# Patient Record
Sex: Female | Born: 1970 | Race: White | Hispanic: Yes | Marital: Married | Smoking: Never smoker
Health system: Southern US, Community
[De-identification: ages and names within clinical notes are randomized; demographics above are authoritative.]

## PROBLEM LIST (undated history)

## (undated) DIAGNOSIS — R519 Headache, unspecified: Secondary | ICD-10-CM

## (undated) DIAGNOSIS — R87619 Unspecified abnormal cytological findings in specimens from cervix uteri: Secondary | ICD-10-CM

## (undated) DIAGNOSIS — M545 Low back pain, unspecified: Secondary | ICD-10-CM

## (undated) DIAGNOSIS — F909 Attention-deficit hyperactivity disorder, unspecified type: Secondary | ICD-10-CM

## (undated) DIAGNOSIS — IMO0002 Reserved for concepts with insufficient information to code with codable children: Secondary | ICD-10-CM

## (undated) DIAGNOSIS — Z8669 Personal history of other diseases of the nervous system and sense organs: Secondary | ICD-10-CM

## (undated) DIAGNOSIS — F419 Anxiety disorder, unspecified: Secondary | ICD-10-CM

## (undated) DIAGNOSIS — Z8489 Family history of other specified conditions: Secondary | ICD-10-CM

## (undated) HISTORY — DX: Unspecified abnormal cytological findings in specimens from cervix uteri: R87.619

## (undated) HISTORY — PX: TUBAL LIGATION: SHX77

## (undated) HISTORY — DX: Personal history of other diseases of the nervous system and sense organs: Z86.69

## (undated) HISTORY — DX: Reserved for concepts with insufficient information to code with codable children: IMO0002

---
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
## 2007-01-24 HISTORY — PX: DILATION AND CURETTAGE OF UTERUS: SHX78

## 2007-07-11 ENCOUNTER — Ambulatory Visit: Payer: Self-pay | Admitting: Gynecology

## 2007-08-16 ENCOUNTER — Ambulatory Visit: Payer: Self-pay | Admitting: Gynecology

## 2007-08-16 ENCOUNTER — Ambulatory Visit (HOSPITAL_COMMUNITY): Admission: RE | Admit: 2007-08-16 | Discharge: 2007-08-16 | Payer: Self-pay | Admitting: Gynecology

## 2007-10-15 ENCOUNTER — Encounter: Payer: Self-pay | Admitting: Obstetrics & Gynecology

## 2007-10-15 ENCOUNTER — Ambulatory Visit: Payer: Self-pay | Admitting: Obstetrics & Gynecology

## 2007-10-15 DIAGNOSIS — R87612 Low grade squamous intraepithelial lesion on cytologic smear of cervix (LGSIL): Secondary | ICD-10-CM

## 2007-10-15 DIAGNOSIS — IMO0002 Reserved for concepts with insufficient information to code with codable children: Secondary | ICD-10-CM

## 2007-10-15 HISTORY — DX: Reserved for concepts with insufficient information to code with codable children: IMO0002

## 2007-10-15 HISTORY — DX: Low grade squamous intraepithelial lesion on cytologic smear of cervix (LGSIL): R87.612

## 2008-01-15 ENCOUNTER — Ambulatory Visit: Payer: Self-pay | Admitting: Obstetrics and Gynecology

## 2008-01-24 HISTORY — PX: ECTOPIC PREGNANCY SURGERY: SHX613

## 2008-02-12 ENCOUNTER — Inpatient Hospital Stay (HOSPITAL_COMMUNITY): Admission: RE | Admit: 2008-02-12 | Discharge: 2008-02-12 | Payer: Self-pay | Admitting: Family Medicine

## 2008-02-12 ENCOUNTER — Ambulatory Visit: Payer: Self-pay | Admitting: Family Medicine

## 2008-02-14 ENCOUNTER — Inpatient Hospital Stay (HOSPITAL_COMMUNITY): Admission: AD | Admit: 2008-02-14 | Discharge: 2008-02-14 | Payer: Self-pay | Admitting: Family Medicine

## 2008-02-18 ENCOUNTER — Inpatient Hospital Stay (HOSPITAL_COMMUNITY): Admission: AD | Admit: 2008-02-18 | Discharge: 2008-02-18 | Payer: Self-pay | Admitting: Obstetrics & Gynecology

## 2008-02-19 ENCOUNTER — Ambulatory Visit: Payer: Self-pay | Admitting: Obstetrics and Gynecology

## 2008-02-19 ENCOUNTER — Inpatient Hospital Stay (HOSPITAL_COMMUNITY): Admission: AD | Admit: 2008-02-19 | Discharge: 2008-02-19 | Payer: Self-pay | Admitting: Obstetrics and Gynecology

## 2008-02-25 ENCOUNTER — Ambulatory Visit (HOSPITAL_COMMUNITY): Admission: RE | Admit: 2008-02-25 | Discharge: 2008-02-25 | Payer: Self-pay | Admitting: Obstetrics & Gynecology

## 2008-02-25 ENCOUNTER — Ambulatory Visit: Payer: Self-pay | Admitting: Obstetrics and Gynecology

## 2008-02-25 ENCOUNTER — Inpatient Hospital Stay (HOSPITAL_COMMUNITY): Admission: AD | Admit: 2008-02-25 | Discharge: 2008-02-25 | Payer: Self-pay | Admitting: Obstetrics & Gynecology

## 2008-02-28 ENCOUNTER — Encounter: Payer: Self-pay | Admitting: Obstetrics and Gynecology

## 2008-03-03 ENCOUNTER — Ambulatory Visit: Payer: Self-pay | Admitting: Obstetrics and Gynecology

## 2008-07-16 ENCOUNTER — Encounter: Payer: Self-pay | Admitting: Obstetrics & Gynecology

## 2008-07-16 ENCOUNTER — Ambulatory Visit: Payer: Self-pay | Admitting: Obstetrics & Gynecology

## 2008-07-16 LAB — CONVERTED CEMR LAB: Vit D, 25-Hydroxy: 34 ng/mL (ref 30–89)

## 2010-05-09 LAB — CBC
HCT: 36.9 % (ref 36.0–46.0)
Hemoglobin: 12.3 g/dL (ref 12.0–15.0)
MCHC: 33.3 g/dL (ref 30.0–36.0)
MCV: 95.6 fL (ref 78.0–100.0)
Platelets: 332 10*3/uL (ref 150–400)
RDW: 13.7 % (ref 11.5–15.5)

## 2010-05-09 LAB — URINALYSIS, ROUTINE W REFLEX MICROSCOPIC
Ketones, ur: NEGATIVE mg/dL
Leukocytes, UA: NEGATIVE
Nitrite: NEGATIVE
Urobilinogen, UA: 0.2 mg/dL (ref 0.0–1.0)
pH: 5.5 (ref 5.0–8.0)

## 2010-05-09 LAB — DIFFERENTIAL
Lymphocytes Relative: 33 % (ref 12–46)
Monocytes Absolute: 0.7 10*3/uL (ref 0.1–1.0)
Monocytes Relative: 7 % (ref 3–12)
Neutro Abs: 5.8 10*3/uL (ref 1.7–7.7)
Neutrophils Relative %: 59 % (ref 43–77)

## 2010-05-09 LAB — HCG, QUANTITATIVE, PREGNANCY
hCG, Beta Chain, Quant, S: 230 m[IU]/mL — ABNORMAL HIGH (ref <5)
hCG, Beta Chain, Quant, S: 430 m[IU]/mL — ABNORMAL HIGH (ref <5)

## 2010-05-09 LAB — COMPREHENSIVE METABOLIC PANEL
Albumin: 3.6 g/dL (ref 3.5–5.2)
BUN: 5 mg/dL — ABNORMAL LOW (ref 6–23)
Calcium: 9.7 mg/dL (ref 8.4–10.5)
Creatinine, Ser: 0.51 mg/dL (ref 0.4–1.2)
Glucose, Bld: 102 mg/dL — ABNORMAL HIGH (ref 70–99)
Potassium: 4.1 mEq/L (ref 3.5–5.1)
Total Protein: 6.4 g/dL (ref 6.0–8.3)

## 2010-05-09 LAB — URINE MICROSCOPIC-ADD ON

## 2010-05-09 LAB — ABO/RH: ABO/RH(D): O POS

## 2010-05-10 LAB — HCG, QUANTITATIVE, PREGNANCY: hCG, Beta Chain, Quant, S: 60 m[IU]/mL — ABNORMAL HIGH (ref <5)
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
## 2010-06-07 NOTE — Assessment & Plan Note (Signed)
Anne Brown, Anne Brown               ACCOUNT NO.:  [id number]   MEDICAL RECORD NO.:  [id number]          PATIENT TYPE:  POB   LOCATION:  [facility] at [facility]         FACILITY:  [facility]   PHYSICIAN:  Ginger Carne, MD DATE OF BIRTH:  [birth year]   DATE OF SERVICE:  07/11/2007                                  CLINIC NOTE   Ms. Anne Brown presents today for routine gynecologic examination.  Her only  complaint is due to occasional loss of urine when she has episodes of  upper respiratory tract infection.  Otherwise she has no symptoms of  genuine urinary stress incontinence.  She also observes that after  intercourse she tends to have urinary tract infections.  The patient is  also interested in having a tubal ligation.  She has no desire for  further childbearing.  Her menses are regular every 28 days lasting 4-5  days.  She has no GI, GU, or cardiac symptomatology.   OB/GYN HISTORY:  The patient has had 5 pregnancies, 3 full-term  pregnancies and 2 first trimester miscarriages.  Current form of  contraception, Ortho Tri-Cyclen Lo.   ALLERGIES:  None.   CURRENT MEDICATIONS:  1. Wellbutrin 150 mg daily.  2. Ortho Tri-Cyclen Lo.   MEDICAL HISTORY:  None.   SURGICAL HISTORY:  None.   FAMILY HISTORY:  No first-degree relatives with breast, colon, ovarian,  or uterine carcinoma or cardiac or coronary artery heart disease.   SOCIAL HISTORY:  The patient is married, nonsmoker.  Denies alcohol or  illicit drug abuse.   REVIEW OF SYSTEMS:  A 14-point comprehensive review of systems within  normal limits.   PHYSICAL EXAMINATION:  VITAL SIGNS:  Blood pressure 138/88, weight 167  pounds, height 5 feet 4 inches, pulse 81 and regular.  HEENT:  Grossly normal.  BREASTS:  Without masses, discharge, thickenings, or tenderness.  CHEST:  Clear to percussion and auscultation.  CARDIOVASCULAR:  Without murmurs or enlargements.  Regular rate and  rhythm.  ABDOMEN:  Soft without gross  hepatosplenomegaly.  PELVIC:  External genitalia, vulva, and vagina are normal.  Cervix is  smooth without erosions or lesions.  Pap smear was not performed today  due to the patient starting her menses.  Uterus is small, anteverted,  and flexed.  Both adnexa palpable and found to be normal.  EXTREMITY:  Within normal limits.  LYMPHATIC:  Within normal limits.  SKIN:  Within normal limits.  NEUROLOGICAL:  Within normal limits.  MUSCULOSKELETAL:  Within normal limits.   IMPRESSION:  1. Normal gynecologic exam.  2. Post intercourse urinary tract infections.  3. Request for permanent sterilization.   PLAN:  I advised the patient that occasional loss of urine only at times  of coughing or sneezing related to an upper respiratory tract infection  would not benefit entirely from a TVT procedure.  I prescribed Macrobid  100 mg after intercourse for prophylaxis, and I have scheduled her for a  bilateral laparoscopic tubal cauterization.  Ashby Dawes of said procedure  was discussed in detail.  The failure rate of one per thousand was  discussed and understood by said patient.  She will be scheduled in the  near future  for this operation.  Otherwise, the patient will have a Pap  smear when she returns for her postoperative visit.           ______________________________  Ginger Carne, MD     SHB/MEDQ  D:  07/11/2007  T:  07/12/2007  Job:  [id number]

## 2010-06-07 NOTE — Op Note (Signed)
NAMESAMEERA, BETTON               ACCOUNT NO.:  [id number]   MEDICAL RECORD NO.:  [id number]          PATIENT TYPE:  AMB   LOCATION:  [facility]                           FACILITY:  [facility]   PHYSICIAN:  Ginger Carne, MD  DATE OF BIRTH:  June 26, 1970   DATE OF PROCEDURE:  08/16/2007  DATE OF DISCHARGE:                               OPERATIVE REPORT   PREOPERATIVE DIAGNOSIS:  Sterilization.   POSTOPERATIVE DIAGNOSIS:  Sterilization.   PROCEDURE:  Bilateral laparoscopic tubal cauterization.   SURGEON:  Ginger Carne, MD   ASSISTANT:  None.   COMPLICATIONS:  None immediate.   ESTIMATED BLOOD LOSS:  Minimal.   FINDINGS:  The patient demonstrates stage I endometriosis with flecks  noted on the broad ligaments and uterosacral ligaments bilaterally,  otherwise uterus, tubes, and ovaries were unremarkable.  Both tubes were  identified from their isthmus to fimbriated ends, separate apart from  their respective round ligaments.   OPERATIVE PROCEDURE:  The patient prepped and draped in usual fashion  and placed in the lithotomy position.  Betadine solution used for  antiseptic and the patient was catheterized prior to procedure.  After  adequate general anesthesia, tenaculum placed in the anterior lip of the  cervix.  Vertical infraumbilical incision was made, and a Veress needle  placed in the abdomen.  Opening closing pressures were 10-15 mmHg.  Needle released, trocar placed in the same incision.  Laparoscope placed  in the trocar sleeve.  A second probe site was made in the left lower  quadrant under direct visualization.  Afterwards, both tubes were  cauterized from their isthmus to mid ampullary region incorporating  about 3 cm.  Tubes were cut in the midportion of cauterization.  No  active bleeding noted.  Gas released, trocars removed.  Closure of 10-mm  fascia site with 0 Vicryl suture and 4-0 Vicryl for subcuticular  closure.  Instruments and sponge count were correct.  The  patient  tolerated the procedure well and returned to post anesthesia recovery  room in excellent condition.      Ginger Carne, MD  Electronically Signed     SHB/MEDQ  D:  08/16/2007  T:  08/16/2007  Job:  [id number]

## 2010-06-07 NOTE — Assessment & Plan Note (Signed)
NAMESANOE, HAZAN               ACCOUNT NO.:  [id number]   MEDICAL RECORD NO.:  [id number]          PATIENT TYPE:  POB   LOCATION:  [facility] at [facility]         FACILITY:  [facility]   PHYSICIAN:  Elsie Lincoln, MD      DATE OF BIRTH:  [birth year]   DATE OF SERVICE:  07/16/2008                                  CLINIC NOTE   The patient is a 40 year old female who presents for several reasons,  first the patient has a history of LSIL Pap smear and had a negative  biopsy.  Her colposcopy seemed adequate by Dr. Okey Dupre in December.  The  patient is here for followup Pap smear.  The patient also had a history  of an ectopic and needs a beta-hCG level drawn, her last level was 18.  Finally, the patient was having some lethargy, back pain, and she is  overall not feeling well and would like her vitamin D level checked, and  we will check that today.  We will call the patient with the vitamin D  level and then have the patient come back in 6 months for another Pap  smear.           ______________________________  Elsie Lincoln, MD     KL/MEDQ  D:  07/16/2008  T:  07/17/2008  Job:  [id number]

## 2010-08-16 ENCOUNTER — Ambulatory Visit: Payer: Self-pay | Admitting: Obstetrics & Gynecology

## 2010-08-18 ENCOUNTER — Ambulatory Visit: Payer: Self-pay | Admitting: Obstetrics and Gynecology

## 2010-08-25 ENCOUNTER — Encounter: Payer: Self-pay | Admitting: Obstetrics & Gynecology

## 2010-08-25 ENCOUNTER — Ambulatory Visit (INDEPENDENT_AMBULATORY_CARE_PROVIDER_SITE_OTHER): Payer: Self-pay | Admitting: Obstetrics & Gynecology

## 2010-08-25 DIAGNOSIS — Z1272 Encounter for screening for malignant neoplasm of vagina: Secondary | ICD-10-CM

## 2010-08-25 DIAGNOSIS — Z124 Encounter for screening for malignant neoplasm of cervix: Secondary | ICD-10-CM

## 2010-08-25 DIAGNOSIS — Z01419 Encounter for gynecological examination (general) (routine) without abnormal findings: Secondary | ICD-10-CM

## 2010-08-25 NOTE — Progress Notes (Signed)
  Subjective:     Anne Brown is a 40 y.o. female here for a routine exam.  Current complaints: No GYN complaints.    Gynecologic History Patient's last menstrual period was 08/15/2010. Contraception: condoms and tubal ligation Last Pap: 06/2008. Results were: normal  Obstetric History OB History    Grav Para Term Preterm Abortions TAB SAB Ect Mult Living   6 3 3  0 3 0 2 1 0 3     # Outc Date GA Lbr Len/2nd Wgt Sex Del Anes PTL Lv   1 TRM     F SVD   Yes   2 TRM     M SVD   Yes   3 TRM     M    Yes   4 SAB            5 SAB            6 ECT                The following portions of the patient's history were reviewed and updated as appropriate: allergies, current medications, past family history, past medical history, past social history, past surgical history and problem list.  Review of Systems A comprehensive review of systems was negative.    Objective:    GENERAL: Well-developed, well-nourished female in no acute distress.  HEENT: Normocephalic, atraumatic. Sclerae anicteric.  NECK: Supple. Normal thyroid.  LUNGS: Clear to auscultation bilaterally.  HEART: Regular rate and rhythm. BREASTS: Symmetric with everted nipples. No masses, skin changes, nipple drainage,   lymphadenopathy. ABDOMEN: Soft, nontender, nondistended. No organomegaly. PELVIC: Normal external female genitalia. Vagina is pink and rugated.  Normal discharge. Normal cervix contour. Uterus is normal in size. No adnexal mass or tenderness. Pap smear obtained.  EXTREMITIES: No cyanosis, clubbing, or edema, 2+ distal pulses.     Assessment:    Healthy female exam.    Plan:    Education reviewed: safe sex/STD prevention. Contraception: condoms and tubal ligation. Follow up in: 1 year. Patient offered HSG given history of ectopic s/p BTL, she declined for now. Will continue to use condoms. Christan Ciccarelli A 08/25/2010

## 2010-08-30 ENCOUNTER — Telehealth: Payer: Self-pay

## 2010-08-30 DIAGNOSIS — N390 Urinary tract infection, site not specified: Secondary | ICD-10-CM

## 2010-08-30 MED ORDER — NITROFURANTOIN MONOHYD MACRO 100 MG PO CAPS: 100.0000 mg | ORAL_CAPSULE | Freq: Two times a day (BID) | ORAL | Status: DC
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
## 2010-10-21 LAB — CBC
HCT: 39.5
MCV: 92.6
Platelets: 346
RBC: 4.26
WBC: 7.3

## 2010-10-21 LAB — BASIC METABOLIC PANEL
BUN: 6
Chloride: 105
Creatinine, Ser: 0.57
GFR calc Af Amer: >60
GFR calc non Af Amer: >60
Potassium: 3.6

## 2010-10-21 LAB — HCG, SERUM, QUALITATIVE: Preg, Serum: NEGATIVE

## 2011-07-06 ENCOUNTER — Ambulatory Visit: Payer: Self-pay | Admitting: Obstetrics & Gynecology

## 2011-07-09 ENCOUNTER — Other Ambulatory Visit: Payer: Self-pay | Admitting: Obstetrics & Gynecology

## 2011-11-09 ENCOUNTER — Ambulatory Visit: Payer: Self-pay | Admitting: Obstetrics & Gynecology

## 2011-11-23 ENCOUNTER — Ambulatory Visit: Payer: Self-pay | Admitting: Obstetrics & Gynecology

## 2012-05-09 ENCOUNTER — Other Ambulatory Visit: Payer: Self-pay | Admitting: Obstetrics & Gynecology

## 2012-05-09 ENCOUNTER — Ambulatory Visit (INDEPENDENT_AMBULATORY_CARE_PROVIDER_SITE_OTHER): Payer: BC Managed Care – PPO | Admitting: Obstetrics & Gynecology

## 2012-05-09 ENCOUNTER — Encounter: Payer: Self-pay | Admitting: Obstetrics & Gynecology

## 2012-05-09 VITALS — BP 126/84 | HR 80 | Ht 61.0 in | Wt 169.8 lb

## 2012-05-09 DIAGNOSIS — Z124 Encounter for screening for malignant neoplasm of cervix: Secondary | ICD-10-CM

## 2012-05-09 DIAGNOSIS — R300 Dysuria: Secondary | ICD-10-CM

## 2012-05-09 DIAGNOSIS — Z1231 Encounter for screening mammogram for malignant neoplasm of breast: Secondary | ICD-10-CM

## 2012-05-09 DIAGNOSIS — Z01419 Encounter for gynecological examination (general) (routine) without abnormal findings: Secondary | ICD-10-CM

## 2012-05-09 DIAGNOSIS — Z1151 Encounter for screening for human papillomavirus (HPV): Secondary | ICD-10-CM

## 2012-05-09 DIAGNOSIS — Z0000 Encounter for general adult medical examination without abnormal findings: Secondary | ICD-10-CM

## 2012-05-09 LAB — POCT URINALYSIS DIPSTICK
Bilirubin, UA: NEGATIVE
Blood, UA: NEGATIVE
Nitrite, UA: NEGATIVE
Urobilinogen, UA: 0.2
pH, UA: 5

## 2012-05-09 LAB — CBC
Hemoglobin: 13.5 g/dL (ref 12.0–15.0)
MCH: 31.5 pg (ref 26.0–34.0)
MCHC: 34.4 g/dL (ref 30.0–36.0)

## 2012-05-09 MED ORDER — NORGESTREL-ETHINYL ESTRADIOL 0.3-30 MG-MCG PO TABS: 1.0000 | ORAL_TABLET | Freq: Every day | ORAL | Status: DC

## 2012-05-09 NOTE — Progress Notes (Signed)
Here today for yearly gyn physical and pap smear. Has issues with urine with strong odor, never seems to turn into a UTI.  Having very heavy periods that require a tampon and a pad. Wants fasting labs today to include liver function.

## 2012-05-09 NOTE — Progress Notes (Signed)
Subjective:    Anne Brown is a 42 y.o. female who presents for an annual exam. She has 2 problems today. 1) 8 month h/o increasing dysmenorrhea and menorrhagia. She reports that menses last 3 days but on one day it is so heavy that she has to wear a pad and tampon at the same time. She takes Midol or IBU 800 mg for the pain with some relief. Her second complaint is that of 5 year h/o post coital occasional bladder infections (takes macrobid post sex). Now she complains of dysuria without having had sex. No urine cultures have been done here yet.  The patient is sexually active. GYN screening history: last pap: was abnormal: LGSIL. The patient wears seatbelts: yes. The patient participates in regular exercise: no. Has the patient ever been transfused or tattooed?: no. The patient reports that there is not domestic violence in her life.   Menstrual History: OB History   Grav Para Term Preterm Abortions TAB SAB Ect Mult Living   6 3 3  0 3 0 2 1 0 3      Menarche age: 79  Patient's last menstrual period was 05/02/2012.    The following portions of the patient's history were reviewed and updated as appropriate: allergies, current medications, past family history, past medical history, past social history, past surgical history and problem list.  Review of Systems A comprehensive review of systems was negative. 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 She has been married for 5 years. Kids are 53, 23, 78 yo children.   Objective:    BP 126/84  Pulse 80  Ht 5\' 1"  (1.549 m)  Wt 169 lb 12.8 oz (77.021 kg)  BMI 32.1 kg/m2  LMP 05/02/2012  General Appearance:    Alert, cooperative, no distress, appears stated age  Head:    Normocephalic, without obvious abnormality, atraumatic  Eyes:    PERRL, conjunctiva/corneas clear, EOM's intact, fundi    benign, both eyes  Ears:    Normal TM's and external ear canals, both ears  Nose:   Nares normal, septum midline, mucosa normal, no drainage     or sinus tenderness  Throat:   Lips, mucosa, and tongue normal; teeth and gums normal  Neck:   Supple, symmetrical, trachea midline, no adenopathy;    thyroid:  no enlargement/tenderness/nodules; no carotid   bruit or JVD  Back:     Symmetric, no curvature, ROM normal, no CVA tenderness  Lungs:     Clear to auscultation bilaterally, respirations unlabored  Chest Wall:    No tenderness or deformity   Heart:    Regular rate and rhythm, S1 and S2 normal, no murmur, rub   or gallop  Breast Exam:    No tenderness, masses, or nipple abnormality  Abdomen:     Soft, non-tender, bowel sounds active all four quadrants,    no masses, no organomegaly  Genitalia:    Normal female without lesion, discharge or tenderness, NSSA, NT, mobile, normal adnexal exam     Extremities:   Extremities normal, atraumatic, no cyanosis or edema  Pulses:   2+ and symmetric all extremities  Skin:   Skin color, texture, turgor normal, no rashes or lesions  Lymph nodes:   Cervical, supraclavicular, and axillary nodes normal  Neurologic:   CNII-XII intact, normal strength, sensation and reflexes    throughout  .    Assessment:    Healthy female exam.  Menorrhagia   Plan:     Mammogram. Urine culture  and sensitivity. screening labs  Pap with HPV cotesting Start OCPs for menorrhagia. U/S if this doesn't help.

## 2012-05-10 LAB — LIPID PANEL
Cholesterol: 133 mg/dL (ref 0–200)
HDL: 71 mg/dL (ref >39)
Total CHOL/HDL Ratio: 1.9 Ratio
Triglycerides: 50 mg/dL (ref <150)
VLDL: 10 mg/dL (ref 0–40)

## 2012-05-10 LAB — COMPREHENSIVE METABOLIC PANEL
Alkaline Phosphatase: 76 U/L (ref 39–117)
Creat: 0.64 mg/dL (ref 0.50–1.10)
Glucose, Bld: 96 mg/dL (ref 70–99)
Sodium: 140 mEq/L (ref 135–145)
Total Bilirubin: 0.4 mg/dL (ref 0.3–1.2)
Total Protein: 7.1 g/dL (ref 6.0–8.3)

## 2012-05-13 ENCOUNTER — Telehealth: Payer: Self-pay | Admitting: *Deleted

## 2012-05-13 MED ORDER — SULFAMETHOXAZOLE-TRIMETHOPRIM 800-160 MG PO TABS: 1.0000 | ORAL_TABLET | Freq: Two times a day (BID) | ORAL | Status: DC

## 2012-05-13 NOTE — Telephone Encounter (Signed)
Per Dr. Marice Potter called in Bactrim DS for urinary tract infection.

## 2012-05-23 ENCOUNTER — Ambulatory Visit (HOSPITAL_COMMUNITY): Payer: BC Managed Care – PPO

## 2012-09-02 ENCOUNTER — Other Ambulatory Visit: Payer: Self-pay | Admitting: Obstetrics & Gynecology

## 2012-09-02 DIAGNOSIS — Z1231 Encounter for screening mammogram for malignant neoplasm of breast: Secondary | ICD-10-CM

## 2012-09-05 ENCOUNTER — Ambulatory Visit (HOSPITAL_COMMUNITY): Payer: Self-pay

## 2012-10-03 ENCOUNTER — Ambulatory Visit (HOSPITAL_COMMUNITY): Payer: Self-pay

## 2013-05-15 ENCOUNTER — Telehealth: Payer: Self-pay

## 2013-05-15 NOTE — Telephone Encounter (Signed)
PATIENT CALLED HAD A YEAST INFECTION, CALLED IN DYFLUCAN 150 #2 WITH 1 REFILL. SHE IS ON AN ANTIBIOTIC AND HAS DEVELOPED A YEAST INFECTION FROM IT. CALLED IT IN TO HER [facility].

## 2013-11-24 ENCOUNTER — Encounter: Payer: Self-pay | Admitting: Obstetrics & Gynecology

## 2014-02-27 ENCOUNTER — Other Ambulatory Visit (HOSPITAL_COMMUNITY)
Admission: RE | Admit: 2014-02-27 | Discharge: 2014-02-27 | Disposition: A | Discharge status: Home / Self Care | Payer: 59 | Source: Ambulatory Visit | Attending: Gynecology | Admitting: Gynecology

## 2014-02-27 ENCOUNTER — Ambulatory Visit (INDEPENDENT_AMBULATORY_CARE_PROVIDER_SITE_OTHER): Payer: 59 | Admitting: Gynecology

## 2014-02-27 ENCOUNTER — Encounter: Payer: Self-pay | Admitting: Gynecology

## 2014-02-27 VITALS — BP 122/78 | Ht 61.5 in | Wt 161.0 lb

## 2014-02-27 DIAGNOSIS — Z1151 Encounter for screening for human papillomavirus (HPV): Secondary | ICD-10-CM | POA: Insufficient documentation

## 2014-02-27 DIAGNOSIS — Z01419 Encounter for gynecological examination (general) (routine) without abnormal findings: Secondary | ICD-10-CM | POA: Insufficient documentation

## 2014-02-27 DIAGNOSIS — Z23 Encounter for immunization: Secondary | ICD-10-CM

## 2014-02-27 DIAGNOSIS — N920 Excessive and frequent menstruation with regular cycle: Secondary | ICD-10-CM

## 2014-02-27 LAB — COMPREHENSIVE METABOLIC PANEL
ALBUMIN: 4.3 g/dL (ref 3.5–5.2)
ALT: 18 U/L (ref 0–35)
AST: 18 U/L (ref 0–37)
Alkaline Phosphatase: 75 U/L (ref 39–117)
BUN: 5 mg/dL — AB (ref 6–23)
CALCIUM: 9.2 mg/dL (ref 8.4–10.5)
CHLORIDE: 103 meq/L (ref 96–112)
CO2: 26 meq/L (ref 19–32)
Creat: 0.6 mg/dL (ref 0.50–1.10)
Glucose, Bld: 86 mg/dL (ref 70–99)
Potassium: 3.9 mEq/L (ref 3.5–5.3)
SODIUM: 139 meq/L (ref 135–145)
Total Bilirubin: 0.3 mg/dL (ref 0.2–1.2)
Total Protein: 6.9 g/dL (ref 6.0–8.3)

## 2014-02-27 LAB — CBC WITH DIFFERENTIAL/PLATELET
BASOS ABS: 0 10*3/uL (ref 0.0–0.1)
Basophils Relative: 0 % (ref 0–1)
Eosinophils Absolute: 0.1 10*3/uL (ref 0.0–0.7)
Eosinophils Relative: 1 % (ref 0–5)
HCT: 40 % (ref 36.0–46.0)
Hemoglobin: 13.3 g/dL (ref 12.0–15.0)
Lymphocytes Relative: 35 % (ref 12–46)
Lymphs Abs: 2.5 10*3/uL (ref 0.7–4.0)
MCH: 30.8 pg (ref 26.0–34.0)
MCHC: 33.3 g/dL (ref 30.0–36.0)
MCV: 92.6 fL (ref 78.0–100.0)
MONO ABS: 0.4 10*3/uL (ref 0.1–1.0)
MPV: 9.6 fL (ref 8.6–12.4)
Monocytes Relative: 6 % (ref 3–12)
NEUTROS ABS: 4.2 10*3/uL (ref 1.7–7.7)
NEUTROS PCT: 58 % (ref 43–77)
Platelets: 320 10*3/uL (ref 150–400)
RBC: 4.32 MIL/uL (ref 3.87–5.11)
RDW: 13.2 % (ref 11.5–15.5)
WBC: 7.2 10*3/uL (ref 4.0–10.5)

## 2014-02-27 LAB — CHOLESTEROL, TOTAL: CHOLESTEROL: 131 mg/dL (ref 0–200)

## 2014-02-27 MED ORDER — DOXYCYCLINE HYCLATE 100 MG PO CAPS
100.0000 mg | ORAL_CAPSULE | Freq: Two times a day (BID) | ORAL | Status: DC
Start: 2014-02-27 — End: 2014-04-24

## 2014-02-27 NOTE — Patient Instructions (Addendum)
Hysterosalpingography Hysterosalpingography is a procedure to look inside your uterus and fallopian tubes. During this procedure, contrast dye is injected into your uterus through your vagina and cervix to illuminate your uterus while X-ray pictures are taken. This procedure may help your health care provider determine whether you have uterine tumors, adhesions, or structural abnormalities. It is commonly used to help determine why a woman is unable to have children (infertility). The procedure usually lasts about 15-30 minutes. LET [facility] CARE PROVIDER KNOW ABOUT:  Any allergies you have.  All medicines you are taking, including vitamins, herbs, eye drops, creams, and over-the-counter medicines.  Previous problems you or members of your family have had with the use of anesthetics.  Any blood disorders you have.  Previous surgeries you have had.  Medical conditions you have. RISKS AND COMPLICATIONS  Generally, this is a safe procedure. However, as with any procedure, problems can occur. Possible problems include:  Infection in the lining of the uterus (endometritis) or fallopian tubes (salpingitis).  Damage or perforation of the uterus or fallopian tubes.  An allergic reaction to the contrast dye used to perform the X-ray. BEFORE THE PROCEDURE   Schedule the procedure after your period stops, but before your next ovulation. This is usually between day 5 and day 10 of your last period. Day 1 is the first day of your period.  Ask your health care provider about changing or stopping your regular medicines.  You may eat and drink as normal.  Empty your bladder before the procedure begins. PROCEDURE  You may be given a medicine to relax you (sedative) or an over-the-counter pain medicine to lessen any discomfort during the procedure.  You will lie down on an X-ray table with your feet in stirrups.  A device called a speculum will be placed into your vagina. This allows your  health care provider to see inside your vagina to the cervix.  The cervix will be washed with a special soap.  A thin, flexible tube will be passed through the cervix into the uterus.  Contrast dye will be put into this tube.  Several X-rays will be taken as the contrast dye spreads through the uterus and fallopian tubes.  The tube will be taken out after the procedure. AFTER THE PROCEDURE   Most of the contrast dye will flow out of your vagina naturally. You may want to wear a sanitary pad.  You may feel mild cramping and notice a little bleeding from your vagina. This should go away in 24 hours.  Ask when your test results will be ready. Make sure you get your test results. Document Released: 02/12/2004 Document Revised: 01/14/2013 Document Reviewed: 07/12/2012 [facility] Patient Information 2015 [place], [place]. This information is not intended to replace advice given to you by your health care provider. Make sure you discuss any questions you have with your health care provider. Influenza Virus Vaccine (Flucelvax) What is this medicine? INFLUENZA VIRUS VACCINE (in floo EN zuh VAHY ruhs vak SEEN) helps to reduce the risk of getting influenza also known as the flu. The vaccine only helps protect you against some strains of the flu. This medicine may be used for other purposes; ask your health care provider or pharmacist if you have questions. COMMON BRAND NAME(S): FLUCELVAX What should I tell my health care provider before I take this medicine? They need to know if you have any of these conditions: -bleeding disorder like hemophilia -fever or infection -Guillain-Barre syndrome or other neurological problems -immune system  problems -infection with the human immunodeficiency virus (HIV) or AIDS -low blood platelet counts -multiple sclerosis -an unusual or allergic reaction to influenza virus vaccine, other medicines, foods, dyes or preservatives -pregnant or trying to get  pregnant -breast-feeding How should I use this medicine? This vaccine is for injection into a muscle. It is given by a health care professional. A copy of Vaccine Information Statements will be given before each vaccination. Read this sheet carefully each time. The sheet may change frequently. Talk to your pediatrician regarding the use of this medicine in children. Special care may be needed. Overdosage: If you think you've taken too much of this medicine contact a poison control center or emergency room at once. Overdosage: If you think you have taken too much of this medicine contact a poison control center or emergency room at once. NOTE: This medicine is only for you. Do not share this medicine with others. What if I miss a dose? This does not apply. What may interact with this medicine? -chemotherapy or radiation therapy -medicines that lower your immune system like etanercept, anakinra, infliximab, and adalimumab -medicines that treat or prevent blood clots like warfarin -phenytoin -steroid medicines like prednisone or cortisone -theophylline -vaccines This list may not describe all possible interactions. Give your health care provider a list of all the medicines, herbs, non-prescription drugs, or dietary supplements you use. Also tell them if you smoke, drink alcohol, or use illegal drugs. Some items may interact with your medicine. What should I watch for while using this medicine? Report any side effects that do not go away within 3 days to your doctor or health care professional. Call your health care provider if any unusual symptoms occur within 6 weeks of receiving this vaccine. You may still catch the flu, but the illness is not usually as bad. You cannot get the flu from the vaccine. The vaccine will not protect against colds or other illnesses that may cause fever. The vaccine is needed every year. What side effects may I notice from receiving this medicine? Side effects that  you should report to your doctor or health care professional as soon as possible: -allergic reactions like skin rash, itching or hives, swelling of the face, lips, or tongue Side effects that usually do not require medical attention (Report these to your doctor or health care professional if they continue or are bothersome.): -fever -headache -muscle aches and pains -pain, tenderness, redness, or swelling at the injection site -tiredness This list may not describe all possible side effects. Call your doctor for medical advice about side effects. You may report side effects to FDA at 1-800-FDA-1088. Where should I keep my medicine? The vaccine will be given by a health care professional in a clinic, pharmacy, doctor's office, or other health care setting. You will not be given vaccine doses to store at home. NOTE: This sheet is a summary. It may not cover all possible information. If you have questions about this medicine, talk to your doctor, pharmacist, or health care provider.  2015, Elsevier/Gold Standard. (2010-12-21 14:06:47) Endometrial Ablation Endometrial ablation removes the lining of the uterus (endometrium). It is usually a same-day, outpatient treatment. Ablation helps avoid major surgery, such as surgery to remove the cervix and uterus (hysterectomy). After endometrial ablation, you will have little or no menstrual bleeding and may not be able to have children. However, if you are premenopausal, you will need to use a reliable method of birth control following the procedure because of the small chance  that pregnancy can occur. There are different reasons to have this procedure, which include: Heavy periods. Bleeding that is causing anemia. Irregular bleeding. Bleeding fibroids on the lining inside the uterus if they are smaller than 3 centimeters. This procedure should not be done if: You want children in the future. You have severe cramps with your menstrual period. You have  precancerous or cancerous cells in your uterus. You were recently pregnant. You have gone through menopause. You have had major surgery on the uterus, such as a cesarean delivery. LET [facility] CARE PROVIDER KNOW ABOUT: Any allergies you have. All medicines you are taking, including vitamins, herbs, eye drops, creams, and over-the-counter medicines. Previous problems you or members of your family have had with the use of anesthetics. Any blood disorders you have. Previous surgeries you have had. Medical conditions you have. RISKS AND COMPLICATIONS  Generally, this is a safe procedure. However, as with any procedure, complications can occur. Possible complications include: Perforation of the uterus. Bleeding. Infection of the uterus, bladder, or vagina. Injury to surrounding organs. An air bubble to the lung (air embolus). Pregnancy following the procedure. Failure of the procedure to help the problem, requiring hysterectomy. Decreased ability to diagnose cancer in the lining of the uterus. BEFORE THE PROCEDURE The lining of the uterus must be tested to make sure there is no pre-cancerous or cancer cells present. An ultrasound may be performed to look at the size of the uterus and to check for abnormalities. Medicines may be given to thin the lining of the uterus. PROCEDURE  During the procedure, your health care provider will use a tool called a resectoscope to help see inside your uterus. There are different ways to remove the lining of your uterus.  Radiofrequency - This method uses a radiofrequency-alternating electric current to remove the lining of the uterus. Cryotherapy - This method uses extreme cold to freeze the lining of the uterus. Heated-Free Liquid - This method uses heated salt (saline) solution to remove the lining of the uterus. Microwave - This method uses high-energy microwaves to heat up the lining of the uterus to remove it. Thermal balloon - This method involves  inserting a catheter with a balloon tip into the uterus. The balloon tip is filled with heated fluid to remove the lining of the uterus. AFTER THE PROCEDURE  After your procedure, do not have sexual intercourse or insert anything into your vagina until permitted by your health care provider. After the procedure, you may experience: Cramps. Vaginal discharge. Frequent urination. Document Released: 11/19/2003 Document Revised: 09/11/2012 Document Reviewed: 06/12/2012 [facility] Patient Information 2015 [place], [place]. This information is not intended to replace advice given to you by your health care provider. Make sure you discuss any questions you have with your health care provider. Levonorgestrel intrauterine device (IUD) What is this medicine? LEVONORGESTREL IUD (LEE voe nor jes trel) is a contraceptive (birth control) device. The device is placed inside the uterus by a healthcare professional. It is used to prevent pregnancy and can also be used to treat heavy bleeding that occurs during your period. Depending on the device, it can be used for 3 to 5 years. This medicine may be used for other purposes; ask your health care provider or pharmacist if you have questions. COMMON BRAND NAME(S): Elveria Royals What should I tell my health care provider before I take this medicine? They need to know if you have any of these conditions: -abnormal Pap smear -cancer of the breast, uterus, or  cervix -diabetes -endometritis -genital or pelvic infection now or in the past -have more than one sexual partner or your partner has more than one partner -heart disease -history of an ectopic or tubal pregnancy -immune system problems -IUD in place -liver disease or tumor -problems with blood clots or take blood-thinners -use intravenous drugs -uterus of unusual shape -vaginal bleeding that has not been explained -an unusual or allergic reaction to levonorgestrel, other hormones, silicone, or  polyethylene, medicines, foods, dyes, or preservatives -pregnant or trying to get pregnant -breast-feeding How should I use this medicine? This device is placed inside the uterus by a health care professional. Talk to your pediatrician regarding the use of this medicine in children. Special care may be needed. Overdosage: If you think you have taken too much of this medicine contact a poison control center or emergency room at once. NOTE: This medicine is only for you. Do not share this medicine with others. What if I miss a dose? This does not apply. What may interact with this medicine? Do not take this medicine with any of the following medications: -amprenavir -bosentan -fosamprenavir This medicine may also interact with the following medications: -aprepitant -barbiturate medicines for inducing sleep or treating seizures -bexarotene -griseofulvin -medicines to treat seizures like carbamazepine, ethotoin, felbamate, oxcarbazepine, phenytoin, topiramate -modafinil -pioglitazone -rifabutin -rifampin -rifapentine -some medicines to treat HIV infection like atazanavir, indinavir, lopinavir, nelfinavir, tipranavir, ritonavir -St. John's wort -warfarin This list may not describe all possible interactions. Give your health care provider a list of all the medicines, herbs, non-prescription drugs, or dietary supplements you use. Also tell them if you smoke, drink alcohol, or use illegal drugs. Some items may interact with your medicine. What should I watch for while using this medicine? Visit your doctor or health care professional for regular check ups. See your doctor if you or your partner has sexual contact with others, becomes HIV positive, or gets a sexual transmitted disease. This product does not protect you against HIV infection (AIDS) or other sexually transmitted diseases. You can check the placement of the IUD yourself by reaching up to the top of your vagina with clean fingers  to feel the threads. Do not pull on the threads. It is a good habit to check placement after each menstrual period. Call your doctor right away if you feel more of the IUD than just the threads or if you cannot feel the threads at all. The IUD may come out by itself. You may become pregnant if the device comes out. If you notice that the IUD has come out use a backup birth control method like condoms and call your health care provider. Using tampons will not change the position of the IUD and are okay to use during your period. What side effects may I notice from receiving this medicine? Side effects that you should report to your doctor or health care professional as soon as possible: -allergic reactions like skin rash, itching or hives, swelling of the face, lips, or tongue -fever, flu-like symptoms -genital sores -high blood pressure -no menstrual period for 6 weeks during use -pain, swelling, warmth in the leg -pelvic pain or tenderness -severe or sudden headache -signs of pregnancy -stomach cramping -sudden shortness of breath -trouble with balance, talking, or walking -unusual vaginal bleeding, discharge -yellowing of the eyes or skin Side effects that usually do not require medical attention (report to your doctor or health care professional if they continue or are bothersome): -acne -breast pain -change in sex  drive or performance -changes in weight -cramping, dizziness, or faintness while the device is being inserted -headache -irregular menstrual bleeding within first 3 to 6 months of use -nausea This list may not describe all possible side effects. Call your doctor for medical advice about side effects. You may report side effects to FDA at 1-800-FDA-1088. Where should I keep my medicine? This does not apply. NOTE: This sheet is a summary. It may not cover all possible information. If you have questions about this medicine, talk to your doctor, pharmacist, or health care  provider.  2015, Elsevier/Gold Standard. (2011-02-09 13:54:04)

## 2014-02-27 NOTE — Progress Notes (Signed)
Anne HeckSusana Salameh 1971-01-20 [id number]   History:    44 y.o.  for annual gyn exam who is a new patient to the practice. Patient[place] Patient stated proximate 23-years ago she had abnormal Pap smear had biopsy and follow-up but no treatment. Similar occurrence in 2009 and since that her Pap smear had been normal. Also she stated that after laparoscopic tubal ligation she had an ectopic pregnancy was treated with methotrexate she does not recall which side. After that she has not been using any contraception. She reports normal menstrual cycles although very heavy lasting 7-10 days. Patient interested in flu vaccine today. Patient has no PCP. Patient has never had a baseline mammogram.  Past medical history,surgical history, family history and social history were all reviewed and documented in the EPIC chart.  Gynecologic History Patient's last menstrual period was 01/31/2014. Contraception: tubal ligation Last Pap: Over 3 years ago. Results were: normal Last mammogram: No prior study. Results were: No prior study  Obstetric History OB History  Gravida Para Term Preterm AB SAB TAB Ectopic Multiple Living  6 3 3  0 3 2 0 1 0 3    # Outcome Date GA Lbr Len/2nd Weight Sex Delivery Anes PTL Lv  6 Ectopic           5 SAB           4 SAB           3 Term     M    Y  2 Term     M Vag-Spont   Y  1 Term     F Vag-Spont   Y       ROS: A ROS was performed and pertinent positives and negatives are included in the history.  GENERAL: No fevers or chills. HEENT: No change in vision, no earache, sore throat or sinus congestion. NECK: No pain or stiffness. CARDIOVASCULAR: No chest pain or pressure. No palpitations. PULMONARY: No shortness of breath, cough or wheeze. GASTROINTESTINAL: No abdominal pain, nausea, vomiting or diarrhea, melena or bright red blood per rectum. GENITOURINARY: No urinary frequency, urgency,  hesitancy or dysuria. MUSCULOSKELETAL: No joint or muscle pain, no back pain, no recent trauma. DERMATOLOGIC: No rash, no itching, no lesions. ENDOCRINE: No polyuria, polydipsia, no heat or cold intolerance. No recent change in weight. HEMATOLOGICAL: No anemia or easy bruising or bleeding. NEUROLOGIC: No headache, seizures, numbness, tingling or weakness. PSYCHIATRIC: No depression, no loss of interest in normal activity or change in sleep pattern.     Exam: chaperone present  BP 122/78 mmHg  Ht 5' 1.5" (1.562 m)  Wt 161 lb (73.029 kg)  BMI 29.93 kg/m2  LMP 01/31/2014  Body mass index is 29.93 kg/(m^2).  General appearance : Well developed well nourished female. No acute distress HEENT: Neck supple, trachea midline, no carotid bruits, no thyroidmegaly Lungs: Clear to auscultation, no rhonchi or wheezes, or rib retractions  Heart: Regular rate and rhythm, no murmurs or gallops Breast:Examined in sitting and supine position were symmetrical in appearance, no palpable masses or tenderness,  no skin retraction, no nipple inversion, no nipple discharge, no skin discoloration, no axillary or supraclavicular lymphadenopathy Abdomen: no palpable masses or tenderness, no rebound or guarding Extremities: no edema or skin discoloration or tenderness  Pelvic:  Bartholin, Urethra, Skene Glands: Within normal limits             Vagina: No gross  lesions or discharge  Cervix: No gross lesions or discharge  Uterus  anteverted, normal size, shape and consistency, non-tender and mobile  Adnexa  Without masses or tenderness  Anus and perineum  normal   Rectovaginal  normal sphincter tone without palpated masses or tenderness             Hemoccult not indicated     Assessment/Plan:  44 y.o. female for annual exam with past history of ectopic pregnancy after tubal ligation currently not using any form of contraception. I have recommended that we schedule an HSG after her next cycle to confirm that her  tubes are indeed occluded. I've also provided her with literature information on the Mirena IUD as well as the her option endometrial ablation for her menorrhagia. She will make an informed to see me one week after the HSG. I've also provided her with a prescription for Vibramycin 100 mg to take 1 by mouth twice a day for 3 days starting the day before the HSG. A requisition to schedule her mammogram was provided. The following labs were ordered. CBC, screening cholesterol, comprehensive metabolic panel, TSH and urinalysis. Pap smear with HPV screening was obtained as well as. Patient received the flu vaccine today.   Ok Edwards MD, 2:56 PM 02/27/2014

## 2014-02-27 NOTE — Addendum Note (Signed)
Addended by: Berna SpareASTILLO, BLANCA A on: 02/27/2014 03:09 PM   Modules accepted: Orders

## 2014-02-28 LAB — URINALYSIS W MICROSCOPIC + REFLEX CULTURE
Bilirubin Urine: NEGATIVE
Casts: NONE SEEN
Crystals: NONE SEEN
Glucose, UA: NEGATIVE mg/dL
Hgb urine dipstick: NEGATIVE
Ketones, ur: NEGATIVE mg/dL
Nitrite: NEGATIVE
Protein, ur: NEGATIVE mg/dL
Specific Gravity, Urine: 1.007 (ref 1.005–1.030)
UROBILINOGEN UA: 0.2 mg/dL (ref 0.0–1.0)
pH: 7.5 (ref 5.0–8.0)

## 2014-02-28 LAB — TSH: TSH: 1.725 u[IU]/mL (ref 0.350–4.500)

## 2014-03-02 ENCOUNTER — Other Ambulatory Visit: Payer: Self-pay | Admitting: Gynecology

## 2014-03-02 LAB — URINE CULTURE

## 2014-03-02 MED ORDER — NITROFURANTOIN MONOHYD MACRO 100 MG PO CAPS: 100.0000 mg | ORAL_CAPSULE | Freq: Two times a day (BID) | ORAL | Status: DC

## 2014-03-05 LAB — CYTOLOGY - PAP

## 2014-03-11 ENCOUNTER — Telehealth: Payer: Self-pay

## 2014-03-11 ENCOUNTER — Telehealth: Payer: Self-pay | Admitting: *Deleted

## 2014-03-11 DIAGNOSIS — N979 Female infertility, unspecified: Secondary | ICD-10-CM

## 2014-03-11 NOTE — Telephone Encounter (Signed)
-----   Message from Theodoro DoingKatherine Annas, [place] sent at 03/11/2014  9:48 AM EST ----- Regarding: needs HSG Patient needs to schedule SHGM. Menses began 03/10/14 and lasts 2-3 days.  #[phone]

## 2014-03-11 NOTE — Telephone Encounter (Signed)
Patient called to schedule HSG.  Menses began yesterday on 03/10/14 and last 2-3 days.  I told her I will forward info to Anne Brown who will schedule for her.

## 2014-03-11 NOTE — Telephone Encounter (Signed)
[facility] below note. Aware to take medication as well.

## 2014-03-17 ENCOUNTER — Ambulatory Visit (HOSPITAL_COMMUNITY)
Admission: RE | Admit: 2014-03-17 | Discharge: 2014-03-17 | Disposition: A | Discharge status: Home / Self Care | Payer: 59 | Source: Ambulatory Visit | Attending: Gynecology | Admitting: Gynecology

## 2014-03-17 DIAGNOSIS — Z9851 Tubal ligation status: Secondary | ICD-10-CM | POA: Insufficient documentation

## 2014-03-17 DIAGNOSIS — N979 Female infertility, unspecified: Secondary | ICD-10-CM | POA: Diagnosis not present

## 2014-03-17 MED ORDER — IOHEXOL 300 MG/ML  SOLN
20.0000 mL | Freq: Once | INTRAMUSCULAR | Status: AC | PRN
  Administered 2014-03-17: 20 mL

## 2014-03-19 ENCOUNTER — Telehealth: Payer: Self-pay

## 2014-03-19 NOTE — Telephone Encounter (Signed)
Patient had HSG done on 03/17/14.  Patient asked what she should do next?

## 2014-03-19 NOTE — Telephone Encounter (Signed)
Left detailed message on cell phone voice mail. 

## 2014-03-19 NOTE — Telephone Encounter (Signed)
Consult to discuss

## 2014-03-24 ENCOUNTER — Other Ambulatory Visit: Payer: Self-pay

## 2014-03-24 DIAGNOSIS — Z1231 Encounter for screening mammogram for malignant neoplasm of breast: Secondary | ICD-10-CM

## 2014-03-27 ENCOUNTER — Ambulatory Visit
Admission: RE | Admit: 2014-03-27 | Discharge: 2014-03-27 | Disposition: A | Discharge status: Home / Self Care | Payer: 59 | Source: Ambulatory Visit

## 2014-03-27 DIAGNOSIS — Z1231 Encounter for screening mammogram for malignant neoplasm of breast: Secondary | ICD-10-CM

## 2014-03-30 ENCOUNTER — Other Ambulatory Visit: Payer: Self-pay | Admitting: Gynecology

## 2014-03-30 DIAGNOSIS — R928 Other abnormal and inconclusive findings on diagnostic imaging of breast: Secondary | ICD-10-CM

## 2014-04-03 ENCOUNTER — Ambulatory Visit
Admission: RE | Admit: 2014-04-03 | Discharge: 2014-04-03 | Disposition: A | Discharge status: Home / Self Care | Payer: 59 | Source: Ambulatory Visit | Attending: Gynecology | Admitting: Gynecology

## 2014-04-03 DIAGNOSIS — R928 Other abnormal and inconclusive findings on diagnostic imaging of breast: Secondary | ICD-10-CM

## 2014-04-24 ENCOUNTER — Encounter: Payer: Self-pay | Admitting: Gynecology

## 2014-04-24 ENCOUNTER — Ambulatory Visit (INDEPENDENT_AMBULATORY_CARE_PROVIDER_SITE_OTHER): Payer: 59 | Admitting: Gynecology

## 2014-04-24 VITALS — BP 130/80 | Wt 166.0 lb

## 2014-04-24 DIAGNOSIS — N920 Excessive and frequent menstruation with regular cycle: Secondary | ICD-10-CM | POA: Diagnosis not present

## 2014-04-24 DIAGNOSIS — Z8759 Personal history of other complications of pregnancy, childbirth and the puerperium: Secondary | ICD-10-CM | POA: Diagnosis not present

## 2014-04-24 DIAGNOSIS — N3000 Acute cystitis without hematuria: Secondary | ICD-10-CM

## 2014-04-24 LAB — URINALYSIS W MICROSCOPIC + REFLEX CULTURE
BILIRUBIN URINE: NEGATIVE
Casts: NONE SEEN
Crystals: NONE SEEN
Glucose, UA: NEGATIVE mg/dL
Hgb urine dipstick: NEGATIVE
Ketones, ur: NEGATIVE mg/dL
NITRITE: NEGATIVE
Protein, ur: NEGATIVE mg/dL
RBC / HPF: NONE SEEN RBC/hpf (ref <3)
Specific Gravity, Urine: <1.005 — ABNORMAL LOW (ref 1.005–1.030)
UROBILINOGEN UA: 0.2 mg/dL (ref 0.0–1.0)
pH: 5.5 (ref 5.0–8.0)

## 2014-04-24 MED ORDER — FLUCONAZOLE 150 MG PO TABS: 150.0000 mg | ORAL_TABLET | Freq: Once | ORAL | Status: DC

## 2014-04-24 MED ORDER — NITROFURANTOIN MONOHYD MACRO 100 MG PO CAPS: ORAL_CAPSULE | ORAL | Status: DC

## 2014-04-24 NOTE — Progress Notes (Signed)
   Patient presented to the office today to discuss her results of her HSG. Patient was seen in the office as a new patient in 02/27/2014. She had mentioned that shortly after she had had a laparoscopic tubal ligation that she had an ectopic pregnancy was treated with methotrexate. She has not been using any form of contraception was concerned about the possibility of getting pregnant. Patient also has been suffering from heavy periods lasting 5-7 days but the first 3 days are very heavy with passage of large clots and cramping.  Her lab work during that office visit demonstrated that she had a normal comprehensive metabolic panel, CBC, TSH, her urine was found to have bacterial organisms identified was Escherichia coli she was placed on Macrobid one by mouth twice a day for 7 days. The organism was sensitive to the Macrobid based on sensitivity panel. She states that she has no burning urinating but her urine has a very strong odor.  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 No peritoneal spill bilaterally  Urinalysis today: 11-20 WBC, many bacteria, urine sent for culture  Assessment/plan: #1 menorrhagia patient would like to proceed with endometrial ablation literature information was provided will schedule accordingly #2 patient will need to come to the office the week before the endometrial ablation to do an endometrial biopsy #3 urinary tract infection. Review of microbiology report from previous cultures had demonstrated that in 2014 as well as in 2016 the organism was Escherichia coli. She believes this happened most common after intercourse. I'm going to give her 1 more week course of Macrobid one by mouth twice a day for 7 days based on last month sensitivity and wait for the sensitivity from this urine culture from today. And I will prescribe her additional Macrobid for her to take 1 tablet after intercourse.

## 2014-04-24 NOTE — Addendum Note (Signed)
Addended by: Kem ParkinsonBARNES, Ulysses Alper on: 04/24/2014 04:19 PM   Modules accepted: Orders

## 2014-04-27 ENCOUNTER — Telehealth: Payer: Self-pay

## 2014-04-27 LAB — URINE CULTURE: Colony Count: 95000

## 2014-04-27 NOTE — Telephone Encounter (Signed)
Dr. Glenetta HewJF sent me surgery request to schedule Her Option Endometrial Ablation. I checked on patient's ins benefits and called her to discuss. I was unable to reach her as her "voice mail box is full and cannot accept messages".  I will try her again another time.

## 2014-04-27 NOTE — Telephone Encounter (Signed)
Patient returned my call regarding ablation.  I discussed insurance benefits with her and her financial responsibility to [facility].  Patient said that she is getting new insurance on 05/08/14 and wants to wait. She said she will get in touch or come by with her new ins information and we will proceed from there. I will wait to hear from her.

## 2014-10-30 ENCOUNTER — Other Ambulatory Visit: Payer: Self-pay | Admitting: Gynecology

## 2014-10-30 DIAGNOSIS — N63 Unspecified lump in unspecified breast: Secondary | ICD-10-CM

## 2014-11-06 ENCOUNTER — Ambulatory Visit
Admission: RE | Admit: 2014-11-06 | Discharge: 2014-11-06 | Disposition: A | Discharge status: Home / Self Care | Payer: 59 | Source: Ambulatory Visit | Attending: Gynecology | Admitting: Gynecology

## 2014-11-06 DIAGNOSIS — N63 Unspecified lump in unspecified breast: Secondary | ICD-10-CM

## 2015-03-17 ENCOUNTER — Encounter: Payer: 59 | Admitting: Gynecology

## 2015-03-31 ENCOUNTER — Encounter: Payer: 59 | Admitting: Gynecology

## 2015-05-13 ENCOUNTER — Other Ambulatory Visit: Payer: Self-pay | Admitting: Gynecology

## 2015-05-13 DIAGNOSIS — N6310 Unspecified lump in the right breast, unspecified quadrant: Secondary | ICD-10-CM

## 2015-05-21 ENCOUNTER — Other Ambulatory Visit: Payer: 59

## 2015-05-28 ENCOUNTER — Ambulatory Visit
Admission: RE | Admit: 2015-05-28 | Discharge: 2015-05-28 | Disposition: A | Discharge status: Home / Self Care | Payer: 59 | Source: Ambulatory Visit | Attending: Gynecology | Admitting: Gynecology

## 2015-05-28 ENCOUNTER — Ambulatory Visit (INDEPENDENT_AMBULATORY_CARE_PROVIDER_SITE_OTHER): Payer: 59 | Admitting: Gynecology

## 2015-05-28 ENCOUNTER — Encounter: Payer: Self-pay | Admitting: Gynecology

## 2015-05-28 VITALS — BP 119/80 | Ht 62.0 in | Wt 169.8 lb

## 2015-05-28 DIAGNOSIS — Z01419 Encounter for gynecological examination (general) (routine) without abnormal findings: Secondary | ICD-10-CM | POA: Diagnosis not present

## 2015-05-28 DIAGNOSIS — N6310 Unspecified lump in the right breast, unspecified quadrant: Secondary | ICD-10-CM

## 2015-05-28 NOTE — Progress Notes (Signed)
Anne HeckSusana Millhouse Jun 12, 1970 [id number]   History:    45 y.o.  for annual gyn exam with no complaints today reporting normal menstrual cycle.Patient stated proximate 23-years ago she had abnormal Pap smear had biopsy and follow-up but no treatment. Similar occurrence in 2009 and since that her Pap smear had been normal.   Mammogram in March 2016 was subsequently followed up with an ultrasound October for a small right breast O hypoechoic lesion and she scheduled for follow-up ultrasound later today.  Past medical history,surgical history, family history and social history were all reviewed and documented in the EPIC chart.  Gynecologic History Patient's last menstrual period was 05/13/2015. Contraception: tubal ligation Last Pap: 2016. Results were: normal Last mammogram: See above. Results were: See above  Obstetric History OB History  Gravida Para Term Preterm AB SAB TAB Ectopic Multiple Living  6 3 3  0 3 2 0 1 0 3    # Outcome Date GA Lbr Len/2nd Weight Sex Delivery Anes PTL Lv  6 Ectopic           5 SAB           4 SAB           3 Term     M    Y  2 Term     M Vag-Spont   Y  1 Term     F Vag-Spont   Y       ROS: A ROS was performed and pertinent positives and negatives are included in the history.  GENERAL: No fevers or chills. HEENT: No change in vision, no earache, sore throat or sinus congestion. NECK: No pain or stiffness. CARDIOVASCULAR: No chest pain or pressure. No palpitations. PULMONARY: No shortness of breath, cough or wheeze. GASTROINTESTINAL: No abdominal pain, nausea, vomiting or diarrhea, melena or bright red blood per rectum. GENITOURINARY: No urinary frequency, urgency, hesitancy or dysuria. MUSCULOSKELETAL: No joint or muscle pain, no back pain, no recent trauma. DERMATOLOGIC: No rash, no itching, no lesions. ENDOCRINE: No polyuria, polydipsia, no heat or cold intolerance. No recent change in weight. HEMATOLOGICAL: No anemia or easy bruising or bleeding.  NEUROLOGIC: No headache, seizures, numbness, tingling or weakness. PSYCHIATRIC: No depression, no loss of interest in normal activity or change in sleep pattern.     Exam: chaperone present  BP 119/80 mmHg  Ht 5\' 2"  (1.575 m)  Wt 169 lb 12.8 oz (77.021 kg)  BMI 31.05 kg/m2  LMP 05/13/2015  Body mass index is 31.05 kg/(m^2).  General appearance : Well developed well nourished female. No acute distress HEENT: Eyes: no retinal hemorrhage or exudates,  Neck supple, trachea midline, no carotid bruits, no thyroidmegaly Lungs: Clear to auscultation, no rhonchi or wheezes, or rib retractions  Heart: Regular rate and rhythm, no murmurs or gallops Breast:Examined in sitting and supine position were symmetrical in appearance, no palpable masses or tenderness,  no skin retraction, no nipple inversion, no nipple discharge, no skin discoloration, no axillary or supraclavicular lymphadenopathy Abdomen: no palpable masses or tenderness, no rebound or guarding Extremities: no edema or skin discoloration or tenderness  Pelvic:  Bartholin, Urethra, Skene Glands: Within normal limits             Vagina: No gross lesions or discharge  Cervix: No gross lesions or discharge  Uterus  anteverted, normal size, shape and consistency, non-tender and mobile  Adnexa  Without masses or tenderness  Anus and perineum  normal   Rectovaginal  normal sphincter tone  without palpated masses or tenderness             Hemoccult not indicated     Assessment/Plan:  45 y.o. female for annual exam had all her blood work done by her PCP in January of this year. Patient to sign a medical release so that they can fax [place] her lab results so that we can scanned in place in the patient's electronic records with [place]. She is encouraged to do her monthly breast exam. Patient to follow-up with her mammogram as described above. No Pap smear indicated this year according to new guidelines.   Ok Edwards MD, 8:51 AM 05/28/2015

## 2016-06-07 ENCOUNTER — Encounter: Payer: Self-pay | Admitting: Gynecology

## 2018-01-02 ENCOUNTER — Other Ambulatory Visit: Payer: Self-pay | Admitting: Family Medicine

## 2018-01-02 DIAGNOSIS — N6310 Unspecified lump in the right breast, unspecified quadrant: Secondary | ICD-10-CM

## 2018-01-25 ENCOUNTER — Ambulatory Visit
Admission: RE | Admit: 2018-01-25 | Discharge: 2018-01-25 | Disposition: A | Discharge status: Home / Self Care | Payer: 59 | Source: Ambulatory Visit | Attending: Family Medicine | Admitting: Family Medicine

## 2018-01-25 DIAGNOSIS — N6310 Unspecified lump in the right breast, unspecified quadrant: Secondary | ICD-10-CM

## 2018-01-25 DIAGNOSIS — N921 Excessive and frequent menstruation with irregular cycle: Secondary | ICD-10-CM | POA: Diagnosis not present

## 2018-01-25 DIAGNOSIS — N6313 Unspecified lump in the right breast, lower outer quadrant: Secondary | ICD-10-CM | POA: Diagnosis not present

## 2018-01-25 DIAGNOSIS — R928 Other abnormal and inconclusive findings on diagnostic imaging of breast: Secondary | ICD-10-CM | POA: Diagnosis not present

## 2018-01-25 DIAGNOSIS — G43009 Migraine without aura, not intractable, without status migrainosus: Secondary | ICD-10-CM | POA: Diagnosis not present

## 2018-01-25 DIAGNOSIS — R42 Dizziness and giddiness: Secondary | ICD-10-CM | POA: Diagnosis not present

## 2018-02-04 DIAGNOSIS — Z124 Encounter for screening for malignant neoplasm of cervix: Secondary | ICD-10-CM | POA: Diagnosis not present

## 2018-02-04 DIAGNOSIS — N920 Excessive and frequent menstruation with regular cycle: Secondary | ICD-10-CM | POA: Diagnosis not present

## 2018-02-04 DIAGNOSIS — N939 Abnormal uterine and vaginal bleeding, unspecified: Secondary | ICD-10-CM | POA: Diagnosis not present

## 2018-02-04 DIAGNOSIS — Z01419 Encounter for gynecological examination (general) (routine) without abnormal findings: Secondary | ICD-10-CM | POA: Diagnosis not present

## 2018-02-07 DIAGNOSIS — I8311 Varicose veins of right lower extremity with inflammation: Secondary | ICD-10-CM | POA: Diagnosis not present

## 2018-02-07 DIAGNOSIS — R600 Localized edema: Secondary | ICD-10-CM | POA: Diagnosis not present

## 2018-02-20 ENCOUNTER — Other Ambulatory Visit: Payer: Self-pay | Admitting: Obstetrics and Gynecology

## 2018-02-20 DIAGNOSIS — N920 Excessive and frequent menstruation with regular cycle: Secondary | ICD-10-CM | POA: Diagnosis not present

## 2018-02-20 DIAGNOSIS — N812 Incomplete uterovaginal prolapse: Secondary | ICD-10-CM | POA: Diagnosis not present

## 2018-02-26 DIAGNOSIS — I8312 Varicose veins of left lower extremity with inflammation: Secondary | ICD-10-CM | POA: Diagnosis not present

## 2018-03-13 DIAGNOSIS — I83812 Varicose veins of left lower extremities with pain: Secondary | ICD-10-CM | POA: Diagnosis not present

## 2018-03-13 DIAGNOSIS — I8312 Varicose veins of left lower extremity with inflammation: Secondary | ICD-10-CM | POA: Diagnosis not present

## 2018-03-25 ENCOUNTER — Other Ambulatory Visit: Payer: Self-pay | Admitting: Obstetrics and Gynecology

## 2018-03-26 DIAGNOSIS — I8312 Varicose veins of left lower extremity with inflammation: Secondary | ICD-10-CM | POA: Diagnosis not present

## 2018-04-30 DIAGNOSIS — G43009 Migraine without aura, not intractable, without status migrainosus: Secondary | ICD-10-CM | POA: Diagnosis not present

## 2018-05-08 ENCOUNTER — Ambulatory Visit (HOSPITAL_BASED_OUTPATIENT_CLINIC_OR_DEPARTMENT_OTHER): Admission: RE | Admit: 2018-05-08 | Payer: 59 | Source: Home / Self Care | Admitting: Obstetrics and Gynecology

## 2018-05-08 ENCOUNTER — Encounter (HOSPITAL_BASED_OUTPATIENT_CLINIC_OR_DEPARTMENT_OTHER): Admission: RE | Payer: Self-pay | Source: Home / Self Care

## 2018-05-08 ENCOUNTER — Ambulatory Visit (HOSPITAL_BASED_OUTPATIENT_CLINIC_OR_DEPARTMENT_OTHER): Admit: 2018-05-08 | Payer: 59 | Admitting: Obstetrics and Gynecology

## 2018-05-08 ENCOUNTER — Encounter (HOSPITAL_BASED_OUTPATIENT_CLINIC_OR_DEPARTMENT_OTHER): Payer: Self-pay

## 2018-05-08 SURGERY — ANTERIOR (CYSTOCELE) AND POSTERIOR REPAIR (RECTOCELE)
Anesthesia: General

## 2018-05-08 SURGERY — ANTERIOR (CYSTOCELE) AND POSTERIOR REPAIR (RECTOCELE)
Anesthesia: Choice

## 2018-06-12 ENCOUNTER — Other Ambulatory Visit: Payer: Self-pay | Admitting: Obstetrics and Gynecology

## 2018-06-18 NOTE — H&P (Addendum)
Anne Brown is a 48 y.o.  female, P: 3-0-3-3 who presents for hysteroscopy, dilatation, curettage and  anterior-posterior colporrhaphy because of abnormal uterine bleeding and symptomatic cystocele and rectocele. For the past several years the patient has had monthly menstrual bleeding for 3 days with a tampon plus pad change every 1.5-2 hours along with cramping rated 7/10 on a 10 point pain scale but relieved with Midol.  Additionally the patient will experience intermenstrual random bleeding that will last just as long with cramping.  She has noticed that when she strains  with  bowel movements she will also bleed.  She denies any post coital bleeding or dyspareunia but admits to occasional constipation. During this same time frame the patient has experienced leaking of urine, especially with exercise, coughing, laughing and at times will have random leakage without warning. She denies any dysuria, urgency, renal stones or hematuria. A pelvic ultrasound in January 2020 showed an anteverted uterus measuring 7.99  x 5.52 x 4.51 cm; with a normal appearing endometrium; right ovary-3.01 cm and left ovary-2.21 cm. An endometrial biopsy at that same time returned proliferative endometrium with no hyperplasia or malignancy and TSH was 2.11.   A review of both medical and surgical management options were given to the patient however,  she has chosen to proceed with surgical evaluation and management of her symptoms.    Past Medical History  OB History: G: 6; P: 3-0-3-3; SVB: 1991, 1992 and 1997 (largest infant over 7.5 lbs)  GYN History: menarche: 48 YO    LMP: 05/28/2018    Contraception: Tubal Sterilization; Has no history of STDs.  Remote  history of abnormal PAP smear that following colposcopy in 2009 remained normal;   Last PAP smear: 01/2018-normal with negative HPV  Medical History: Migraine and Attention Deficit Disorder  Surgical History: 1992 D & C  ;  2009 D & C;  2009 Tubal Sterilization  and 2010  Removal of Ectopic Denies problems with anesthesia.  Refuses Blood Transfusions ([employer])  Family History: Depression and Stroke  Social History:  Married and Employed with [employer];;    Denies tobacco use and occasionally consumes alcohol; Patient is of the [employer] faith.   Medicines: Amitriptyline 100 mg qhs Buproprion HCL  150 mg SR daily Fioricet 1-2 tablets orally every 4 hours prn Cyclbenzaprine 5 mg every 6 hour+s prn Elitriptan 40 mg po stat as directed Emgality Pen 120 mg /mL 1 mL Sub-Q monthly Lorazepam 0.5 mg daily prn Methylphenidate 20 mg daily Meclizine 25 mg every 6 hours prn Zyrtec daily   Denies sensitivity to peanuts, shellfish, soy, latex or adhesives.  No Known Allergies  ROS: Admits to glasses, headaches, constipation, incontinence and finger/knee joint pain;   Denies vision changes, nasal congestion, dysphagia, tinnitus, dizziness, hoarseness, cough,  chest pain, shortness of breath, nausea, vomiting, diarrhea, urinary frequency,  dysuria, hematuria, vaginitis symptoms, pelvic pain, swelling of joints,easy bruising,  myalgias, skin rashes, unexplained weight loss and except as is mentioned in the history of present illness, patient's review of systems is otherwise negative.   Physical Exam  Bp: 104/64  P: 80 bpm  R: 16   Temperature: 97.3 degrees F orally  Weight: 160 lbs. Height: 5\' 1"   BMI: 30.2  Neck: supple without masses or thyromegaly Lungs: clear to auscultation Heart: regular rate and rhythm Abdomen: soft, non-tender and no organomegaly Pelvic:EGBUS- wnl; vagina-relaxation;; uterus-normal size, cervix without lesions or motion tenderness; adnexae-no tenderness or masses Extremities:  no clubbing, cyanosis or  edema   Assesment: Abnormal Uterine Bleeding                      Symptomatic Pelvic Relaxation                      Urinary Incontinence   Disposition:  A discussion was held with patient regarding  the indication for her procedure(s) along with the risks, which include but are not limited to: reaction to anesthesia, damage to adjacent organs, infection,  excessive bleeding. and worsening of urinary symptoms..  The patient verbalized understanding of these risks and has consented to proceed with Hysteroscopy D & C with and  Anterior-Posterior Colporrhaphy at [facility] on June 27, 2018.   CSN# [id number]   [place] J. Lowell Guitar, PA-C  for Dr. Woodroe Mode. Deadra Diggins  Pt did not initially c/o leaking.  Since that time and with delay of surgery she reported to Henreitta Leber, PA-C.  I discussed that today with the patient and that a TVT could have been done as well today if I had known.  She is ok with proceeding without.  She states symptoms are not that bad.  She may decide on proceeding with TVT at a later time.  R/B/A of the intended procedure A-P Repair and hysteroscopy/D&C/Ablation previously discussed at length and questions answered.  Additional questions answered today.  Pt has a h/o sterilization procedure and does not desire future fertility.

## 2018-06-21 ENCOUNTER — Encounter (HOSPITAL_BASED_OUTPATIENT_CLINIC_OR_DEPARTMENT_OTHER): Payer: Self-pay | Admitting: *Deleted

## 2018-06-21 ENCOUNTER — Other Ambulatory Visit: Payer: Self-pay

## 2018-06-21 NOTE — Progress Notes (Signed)
Spoke with Anne Brown after midnight, meds to take:sip of water: lorazepam prn, flexeril prn Arrive 845 am 06-27-18 [facility] Patient given overnight stay instructions including bring all prescriptions medications in original containers Has surgery orders in epic  needs urine pregnancy day of surgery Has lab and covid test appointment  06-24-18 215pm

## 2018-06-24 ENCOUNTER — Encounter (HOSPITAL_COMMUNITY)
Admission: RE | Admit: 2018-06-24 | Discharge: 2018-06-24 | Disposition: A | Discharge status: Home / Self Care | Payer: 59 | Source: Ambulatory Visit | Attending: Obstetrics and Gynecology | Admitting: Obstetrics and Gynecology

## 2018-06-24 ENCOUNTER — Other Ambulatory Visit: Payer: Self-pay

## 2018-06-24 ENCOUNTER — Other Ambulatory Visit (HOSPITAL_COMMUNITY)
Admission: RE | Admit: 2018-06-24 | Discharge: 2018-06-24 | Disposition: A | Discharge status: Home / Self Care | Payer: 59 | Source: Ambulatory Visit | Attending: Obstetrics and Gynecology | Admitting: Obstetrics and Gynecology

## 2018-06-24 DIAGNOSIS — Z1159 Encounter for screening for other viral diseases: Secondary | ICD-10-CM | POA: Diagnosis not present

## 2018-06-24 LAB — CBC
HCT: 44 % (ref 36.0–46.0)
Hemoglobin: 13.6 g/dL (ref 12.0–15.0)
MCH: 30.3 pg (ref 26.0–34.0)
MCHC: 30.9 g/dL (ref 30.0–36.0)
MCV: 98 fL (ref 80.0–100.0)
Platelets: 336 10*3/uL (ref 150–400)
RBC: 4.49 MIL/uL (ref 3.87–5.11)
RDW: 13.6 % (ref 11.5–15.5)
WBC: 7.6 10*3/uL (ref 4.0–10.5)
nRBC: 0 % (ref 0.0–0.2)

## 2018-06-24 LAB — BASIC METABOLIC PANEL
Anion gap: 7 (ref 5–15)
BUN: 8 mg/dL (ref 6–20)
CO2: 30 mmol/L (ref 22–32)
Calcium: 9.3 mg/dL (ref 8.9–10.3)
Chloride: 104 mmol/L (ref 98–111)
Creatinine, Ser: 0.57 mg/dL (ref 0.44–1.00)
GFR calc Af Amer: >60 mL/min (ref >60)
GFR calc non Af Amer: >60 mL/min (ref >60)
Glucose, Bld: 106 mg/dL — ABNORMAL HIGH (ref 70–99)
Potassium: 4 mmol/L (ref 3.5–5.1)
Sodium: 141 mmol/L (ref 135–145)

## 2018-06-26 LAB — NOVEL CORONAVIRUS, NAA (HOSP ORDER, SEND-OUT TO REF LAB; TAT 18-24 HRS): SARS-CoV-2, NAA: NOT DETECTED

## 2018-06-26 NOTE — Progress Notes (Addendum)
SPOKE W/  _ [place]     SCREENING SYMPTOMS OF COVID 19:   COUGH--YES (has seasonal allergies)  RUNNY NOSE--- YES (has seasonal allergies)  SORE THROAT---YES (has seasonal allergies)  NASAL CONGESTION----NO  SNEEZING----NO  SHORTNESS OF BREATH---NO  DIFFICULTY BREATHING---NO  TEMP >100.0 -----NO  UNEXPLAINED BODY ACHES------NO   CHILLS -------- NO HEADACHES ---------YES-HAS hx of MIGRAINES  LOSS OF SMELL/ TASTE --------NO    HAVE YOU OR ANY FAMILY MEMBER TRAVELLED PAST 14 DAYS OUT OF THE   COUNTY---NO STATE----NO COUNTRY----NO  HAVE YOU OR ANY FAMILY MEMBER BEEN EXPOSED TO ANYONE WITH COVID 19? NO

## 2018-06-27 ENCOUNTER — Encounter (HOSPITAL_BASED_OUTPATIENT_CLINIC_OR_DEPARTMENT_OTHER): Payer: Self-pay

## 2018-06-27 ENCOUNTER — Encounter (HOSPITAL_BASED_OUTPATIENT_CLINIC_OR_DEPARTMENT_OTHER)
Admission: RE | Disposition: A | Discharge status: Home / Self Care | Payer: Self-pay | Source: Home / Self Care | Attending: Obstetrics and Gynecology

## 2018-06-27 ENCOUNTER — Observation Stay (HOSPITAL_BASED_OUTPATIENT_CLINIC_OR_DEPARTMENT_OTHER)
Admission: RE | Admit: 2018-06-27 | Discharge: 2018-06-28 | Disposition: A | Discharge status: Home / Self Care | Payer: 59 | Attending: Obstetrics and Gynecology | Admitting: Obstetrics and Gynecology

## 2018-06-27 ENCOUNTER — Ambulatory Visit (HOSPITAL_BASED_OUTPATIENT_CLINIC_OR_DEPARTMENT_OTHER): Payer: 59 | Admitting: Anesthesiology

## 2018-06-27 ENCOUNTER — Ambulatory Visit (HOSPITAL_BASED_OUTPATIENT_CLINIC_OR_DEPARTMENT_OTHER): Payer: 59 | Admitting: Physician Assistant

## 2018-06-27 ENCOUNTER — Other Ambulatory Visit: Payer: Self-pay

## 2018-06-27 DIAGNOSIS — N816 Rectocele: Secondary | ICD-10-CM | POA: Insufficient documentation

## 2018-06-27 DIAGNOSIS — Z791 Long term (current) use of non-steroidal anti-inflammatories (NSAID): Secondary | ICD-10-CM | POA: Diagnosis not present

## 2018-06-27 DIAGNOSIS — N812 Incomplete uterovaginal prolapse: Secondary | ICD-10-CM | POA: Diagnosis not present

## 2018-06-27 DIAGNOSIS — Z79899 Other long term (current) drug therapy: Secondary | ICD-10-CM | POA: Diagnosis not present

## 2018-06-27 DIAGNOSIS — N393 Stress incontinence (female) (male): Secondary | ICD-10-CM | POA: Diagnosis not present

## 2018-06-27 DIAGNOSIS — N939 Abnormal uterine and vaginal bleeding, unspecified: Secondary | ICD-10-CM | POA: Insufficient documentation

## 2018-06-27 DIAGNOSIS — N920 Excessive and frequent menstruation with regular cycle: Secondary | ICD-10-CM | POA: Diagnosis present

## 2018-06-27 DIAGNOSIS — N840 Polyp of corpus uteri: Secondary | ICD-10-CM | POA: Insufficient documentation

## 2018-06-27 DIAGNOSIS — N814 Uterovaginal prolapse, unspecified: Secondary | ICD-10-CM | POA: Diagnosis present

## 2018-06-27 HISTORY — DX: Headache, unspecified: R51.9

## 2018-06-27 HISTORY — PX: HYSTEROSCOPY WITH NOVASURE: SHX5574

## 2018-06-27 HISTORY — PX: ANTERIOR AND POSTERIOR REPAIR: SHX5121

## 2018-06-27 HISTORY — DX: Attention-deficit hyperactivity disorder, unspecified type: F90.9

## 2018-06-27 HISTORY — DX: Family history of other specified conditions: Z84.89

## 2018-06-27 HISTORY — DX: Anxiety disorder, unspecified: F41.9

## 2018-06-27 HISTORY — DX: Low back pain, unspecified: M54.50

## 2018-06-27 LAB — POCT PREGNANCY, URINE: Preg Test, Ur: NEGATIVE

## 2018-06-27 SURGERY — ANTERIOR (CYSTOCELE) AND POSTERIOR REPAIR (RECTOCELE)
Anesthesia: General

## 2018-06-27 MED ORDER — HYDROMORPHONE HCL 1 MG/ML IJ SOLN
INTRAMUSCULAR | Status: AC
  Filled 2018-06-27: qty 1

## 2018-06-27 MED ORDER — IBUPROFEN 600 MG PO TABS
600.0000 mg | ORAL_TABLET | Freq: Four times a day (QID) | ORAL | Status: DC
  Administered 2018-06-27 – 2018-06-28 (×2): 600 mg via ORAL
  Filled 2018-06-27: qty 1

## 2018-06-27 MED ORDER — ROCURONIUM BROMIDE 100 MG/10ML IV SOLN
INTRAVENOUS | Status: DC | PRN
  Administered 2018-06-27: 60 mg via INTRAVENOUS

## 2018-06-27 MED ORDER — ESTRADIOL 0.1 MG/GM VA CREA
TOPICAL_CREAM | VAGINAL | Status: DC | PRN
  Administered 2018-06-27: 1 via VAGINAL

## 2018-06-27 MED ORDER — LACTATED RINGERS IV SOLN
INTRAVENOUS | Status: DC
  Administered 2018-06-27: 09:00:00 50 mL/h via INTRAVENOUS
  Administered 2018-06-27: 14:00:00 via INTRAVENOUS
  Filled 2018-06-27: qty 1000

## 2018-06-27 MED ORDER — SCOPOLAMINE 1 MG/3DAYS TD PT72
MEDICATED_PATCH | TRANSDERMAL | Status: AC
  Filled 2018-06-27: qty 1

## 2018-06-27 MED ORDER — AMITRIPTYLINE HCL 50 MG PO TABS
50.0000 mg | ORAL_TABLET | Freq: Every day | ORAL | Status: DC
  Administered 2018-06-27: 50 mg via ORAL
  Filled 2018-06-27 (×2): qty 1

## 2018-06-27 MED ORDER — BUPROPION HCL ER (SR) 150 MG PO TB12
150.0000 mg | ORAL_TABLET | Freq: Every day | ORAL | Status: DC
  Administered 2018-06-27: 150 mg via ORAL
  Filled 2018-06-27 (×2): qty 1

## 2018-06-27 MED ORDER — MEPERIDINE HCL 25 MG/ML IJ SOLN
6.2500 mg | INTRAMUSCULAR | Status: DC | PRN
  Filled 2018-06-27: qty 1

## 2018-06-27 MED ORDER — SIMETHICONE 80 MG PO CHEW
80.0000 mg | CHEWABLE_TABLET | Freq: Four times a day (QID) | ORAL | Status: DC | PRN
  Filled 2018-06-27: qty 1

## 2018-06-27 MED ORDER — OXYCODONE-ACETAMINOPHEN 5-325 MG PO TABS
1.0000 | ORAL_TABLET | ORAL | Status: DC | PRN
  Administered 2018-06-27 – 2018-06-28 (×4): 1 via ORAL
  Filled 2018-06-27: qty 2

## 2018-06-27 MED ORDER — PROPOFOL 10 MG/ML IV BOLUS
INTRAVENOUS | Status: DC | PRN
  Administered 2018-06-27: 150 mg via INTRAVENOUS

## 2018-06-27 MED ORDER — METHYLPHENIDATE HCL 20 MG PO TABS
20.0000 mg | ORAL_TABLET | Freq: Two times a day (BID) | ORAL | Status: DC
  Filled 2018-06-27 (×2): qty 1

## 2018-06-27 MED ORDER — ROCURONIUM BROMIDE 10 MG/ML (PF) SYRINGE
PREFILLED_SYRINGE | INTRAVENOUS | Status: AC
  Filled 2018-06-27: qty 10

## 2018-06-27 MED ORDER — DEXAMETHASONE SODIUM PHOSPHATE 10 MG/ML IJ SOLN
INTRAMUSCULAR | Status: AC
  Filled 2018-06-27: qty 1

## 2018-06-27 MED ORDER — CEFAZOLIN SODIUM-DEXTROSE 2-4 GM/100ML-% IV SOLN
INTRAVENOUS | Status: AC
  Filled 2018-06-27: qty 100

## 2018-06-27 MED ORDER — LIDOCAINE HCL (CARDIAC) PF 100 MG/5ML IV SOSY
PREFILLED_SYRINGE | INTRAVENOUS | Status: DC | PRN
  Administered 2018-06-27: 100 mg via INTRAVENOUS

## 2018-06-27 MED ORDER — OXYCODONE-ACETAMINOPHEN 5-325 MG PO TABS
ORAL_TABLET | ORAL | Status: AC
  Filled 2018-06-27: qty 1

## 2018-06-27 MED ORDER — SUGAMMADEX SODIUM 200 MG/2ML IV SOLN
INTRAVENOUS | Status: DC | PRN
  Administered 2018-06-27: 100 mg via INTRAVENOUS

## 2018-06-27 MED ORDER — HYDROMORPHONE HCL 1 MG/ML IJ SOLN
0.2500 mg | INTRAMUSCULAR | Status: DC | PRN
  Administered 2018-06-27 (×2): 0.25 mg via INTRAVENOUS
  Administered 2018-06-27: 15:00:00 0.5 mg via INTRAVENOUS
  Administered 2018-06-27: 0.25 mg via INTRAVENOUS
  Filled 2018-06-27: qty 0.5

## 2018-06-27 MED ORDER — ONDANSETRON 4 MG PO TBDP
ORAL_TABLET | ORAL | Status: AC
  Filled 2018-06-27: qty 1

## 2018-06-27 MED ORDER — VASOPRESSIN 20 UNIT/ML IV SOLN
INTRAVENOUS | Status: DC | PRN
  Administered 2018-06-27: 14:00:00 50 mL via INTRAMUSCULAR

## 2018-06-27 MED ORDER — DEXAMETHASONE SODIUM PHOSPHATE 4 MG/ML IJ SOLN
INTRAMUSCULAR | Status: DC | PRN
  Administered 2018-06-27: 10 mg via INTRAVENOUS

## 2018-06-27 MED ORDER — EPHEDRINE SULFATE 50 MG/ML IJ SOLN
INTRAMUSCULAR | Status: DC | PRN
  Administered 2018-06-27: 10 mg via INTRAVENOUS

## 2018-06-27 MED ORDER — LIDOCAINE 2% (20 MG/ML) 5 ML SYRINGE
INTRAMUSCULAR | Status: AC
  Filled 2018-06-27: qty 5

## 2018-06-27 MED ORDER — PROMETHAZINE HCL 25 MG/ML IJ SOLN
INTRAMUSCULAR | Status: AC
  Filled 2018-06-27: qty 1

## 2018-06-27 MED ORDER — ONDANSETRON HCL 4 MG/2ML IJ SOLN
INTRAMUSCULAR | Status: DC | PRN
  Administered 2018-06-27: 4 mg via INTRAVENOUS

## 2018-06-27 MED ORDER — MIDAZOLAM HCL 2 MG/2ML IJ SOLN
INTRAMUSCULAR | Status: AC
  Filled 2018-06-27: qty 2

## 2018-06-27 MED ORDER — MENTHOL 3 MG MT LOZG
1.0000 | LOZENGE | OROMUCOSAL | Status: DC | PRN
  Filled 2018-06-27: qty 9

## 2018-06-27 MED ORDER — FENTANYL CITRATE (PF) 100 MCG/2ML IJ SOLN
INTRAMUSCULAR | Status: DC | PRN
  Administered 2018-06-27: 100 ug via INTRAVENOUS
  Administered 2018-06-27: 50 ug via INTRAVENOUS

## 2018-06-27 MED ORDER — PROPOFOL 10 MG/ML IV BOLUS
INTRAVENOUS | Status: AC
  Filled 2018-06-27: qty 20

## 2018-06-27 MED ORDER — CEFAZOLIN SODIUM-DEXTROSE 2-4 GM/100ML-% IV SOLN
2.0000 g | INTRAVENOUS | Status: AC
  Administered 2018-06-27: 2 g via INTRAVENOUS
  Filled 2018-06-27: qty 100

## 2018-06-27 MED ORDER — LIDOCAINE HCL 1 % IJ SOLN
INTRAMUSCULAR | Status: DC | PRN
  Administered 2018-06-27: 10 mL

## 2018-06-27 MED ORDER — MIDAZOLAM HCL 5 MG/5ML IJ SOLN
INTRAMUSCULAR | Status: DC | PRN
  Administered 2018-06-27: 2 mg via INTRAVENOUS

## 2018-06-27 MED ORDER — SODIUM CHLORIDE 0.9 % IR SOLN
Status: DC | PRN
  Administered 2018-06-27: 1

## 2018-06-27 MED ORDER — ONDANSETRON HCL 4 MG/2ML IJ SOLN
4.0000 mg | Freq: Once | INTRAMUSCULAR | Status: DC | PRN
  Filled 2018-06-27: qty 2

## 2018-06-27 MED ORDER — ONDANSETRON HCL 4 MG PO TABS
4.0000 mg | ORAL_TABLET | Freq: Three times a day (TID) | ORAL | Status: DC | PRN
  Administered 2018-06-27: 4 mg via ORAL
  Filled 2018-06-27: qty 1

## 2018-06-27 MED ORDER — FENTANYL CITRATE (PF) 100 MCG/2ML IJ SOLN
INTRAMUSCULAR | Status: AC
  Filled 2018-06-27: qty 2

## 2018-06-27 MED ORDER — DOCUSATE SODIUM 100 MG PO CAPS
100.0000 mg | ORAL_CAPSULE | Freq: Two times a day (BID) | ORAL | Status: DC
  Administered 2018-06-27: 100 mg via ORAL
  Filled 2018-06-27: qty 1

## 2018-06-27 MED ORDER — FENTANYL CITRATE (PF) 250 MCG/5ML IJ SOLN
INTRAMUSCULAR | Status: AC
  Filled 2018-06-27: qty 5

## 2018-06-27 MED ORDER — SCOPOLAMINE 1 MG/3DAYS TD PT72
1.0000 | MEDICATED_PATCH | Freq: Once | TRANSDERMAL | Status: DC
  Administered 2018-06-27: 17:00:00 1.5 mg via TRANSDERMAL
  Filled 2018-06-27: qty 1

## 2018-06-27 MED ORDER — IBUPROFEN 200 MG PO TABS
ORAL_TABLET | ORAL | Status: AC
  Filled 2018-06-27: qty 3

## 2018-06-27 MED ORDER — KETOROLAC TROMETHAMINE 30 MG/ML IJ SOLN
INTRAMUSCULAR | Status: DC | PRN
  Administered 2018-06-27: 30 mg via INTRAVENOUS

## 2018-06-27 MED ORDER — PROMETHAZINE HCL 25 MG/ML IJ SOLN
25.0000 mg | Freq: Four times a day (QID) | INTRAMUSCULAR | Status: DC | PRN
  Administered 2018-06-27: 17:00:00 25 mg via INTRAVENOUS
  Filled 2018-06-27: qty 1

## 2018-06-27 MED ORDER — KETOROLAC TROMETHAMINE 30 MG/ML IJ SOLN
INTRAMUSCULAR | Status: AC
  Filled 2018-06-27: qty 1

## 2018-06-27 MED ORDER — LACTATED RINGERS IV SOLN
INTRAVENOUS | Status: DC
  Administered 2018-06-28: 01:00:00 via INTRAVENOUS
  Filled 2018-06-27 (×2): qty 1000

## 2018-06-27 SURGICAL SUPPLY — 39 items
ABLATOR SURESOUND NOVASURE (ABLATOR) ×3 IMPLANT
BLADE SURG 15 STRL LF DISP TIS (BLADE) ×1 IMPLANT
BLADE SURG 15 STRL SS (BLADE) ×2
BRIEF STRETCH FOR OB PAD XXL (UNDERPADS AND DIAPERS) ×3 IMPLANT
CANISTER SUCT 3000ML PPV (MISCELLANEOUS) ×3 IMPLANT
CATH ROBINSON RED A/P 16FR (CATHETERS) ×3 IMPLANT
DECANTER SPIKE VIAL GLASS SM (MISCELLANEOUS) IMPLANT
DRAPE SHEET LG 3/4 BI-LAMINATE (DRAPES) ×3 IMPLANT
GAUZE PACKING 2X5 YD STRL (GAUZE/BANDAGES/DRESSINGS) IMPLANT
GLOVE BIO SURGEON STRL SZ7.5 (GLOVE) ×3 IMPLANT
GLOVE BIOGEL PI IND STRL 7.0 (GLOVE) ×3 IMPLANT
GLOVE BIOGEL PI IND STRL 7.5 (GLOVE) ×1 IMPLANT
GLOVE BIOGEL PI INDICATOR 7.0 (GLOVE) ×6
GLOVE BIOGEL PI INDICATOR 7.5 (GLOVE) ×2
GOWN STRL REUS W/ TWL LRG LVL3 (GOWN DISPOSABLE) ×5 IMPLANT
GOWN STRL REUS W/TWL LRG LVL3 (GOWN DISPOSABLE) ×10
HIBICLENS CHG 4% 4OZ BTL (MISCELLANEOUS) IMPLANT
KIT PROCEDURE FLUENT (KITS) ×3 IMPLANT
KIT TURNOVER KIT B (KITS) ×3 IMPLANT
NEEDLE HYPO 22GX1.5 SAFETY (NEEDLE) ×3 IMPLANT
NS IRRIG 1000ML POUR BTL (IV SOLUTION) ×3 IMPLANT
PACK VAGINAL MINOR WOMEN LF (CUSTOM PROCEDURE TRAY) IMPLANT
PACK VAGINAL WOMENS (CUSTOM PROCEDURE TRAY) ×3 IMPLANT
PAD OB MATERNITY 4.3X12.25 (PERSONAL CARE ITEMS) ×3 IMPLANT
PAD PREP 24X48 CUFFED NSTRL (MISCELLANEOUS) ×3 IMPLANT
SET CYSTO W/LG BORE CLAMP LF (SET/KITS/TRAYS/PACK) ×3 IMPLANT
SPECIMEN JAR MEDIUM (MISCELLANEOUS) IMPLANT
SUT VIC AB 0 CT1 18XCR BRD8 (SUTURE) IMPLANT
SUT VIC AB 0 CT1 8-18 (SUTURE)
SUT VIC AB 1 CT1 36 (SUTURE) IMPLANT
SUT VIC AB 2-0 CT1 27 (SUTURE) ×8
SUT VIC AB 2-0 CT1 TAPERPNT 27 (SUTURE) ×4 IMPLANT
SUT VIC AB 2-0 SH 27 (SUTURE) ×20
SUT VIC AB 2-0 SH 27XBRD (SUTURE) ×10 IMPLANT
SUT VIC AB 3-0 SH 27 (SUTURE)
SUT VIC AB 3-0 SH 27X BRD (SUTURE) IMPLANT
TOWEL GREEN STERILE FF (TOWEL DISPOSABLE) IMPLANT
TOWEL OR 17X26 10 PK STRL BLUE (TOWEL DISPOSABLE) ×3 IMPLANT
TRAY FOLEY CATH 16FR SILVER (SET/KITS/TRAYS/PACK) ×3 IMPLANT

## 2018-06-27 NOTE — Anesthesia Preprocedure Evaluation (Signed)
Anesthesia Evaluation  Patient identified by MRN, date of birth, ID band Patient awake    Reviewed: Allergy & Precautions, NPO status , Patient's Chart, lab work & pertinent test results  Airway Mallampati: I  TM Distance: >3 FB Neck ROM: Full    Dental   Pulmonary    Pulmonary exam normal        Cardiovascular Normal cardiovascular exam     Neuro/Psych Anxiety    GI/Hepatic   Endo/Other    Renal/GU      Musculoskeletal   Abdominal   Peds  Hematology   Anesthesia Other Findings   Reproductive/Obstetrics                             Anesthesia Physical Anesthesia Plan  ASA: II  Anesthesia Plan: General   Post-op Pain Management:    Induction: Intravenous  PONV Risk Score and Plan: 3 and Ondansetron, Midazolam and Dexamethasone  Airway Management Planned: Oral ETT  Additional Equipment:   Intra-op Plan:   Post-operative Plan: Extubation in OR  Informed Consent: I have reviewed the patients History and Physical, chart, labs and discussed the procedure including the risks, benefits and alternatives for the proposed anesthesia with the patient or authorized representative who has indicated his/her understanding and acceptance.       Plan Discussed with: CRNA and Surgeon  Anesthesia Plan Comments:         Anesthesia Quick Evaluation

## 2018-06-27 NOTE — Anesthesia Procedure Notes (Addendum)
Procedure Name: Intubation Date/Time: 06/27/2018 11:34 AM Performed by: Bonney Aid, CRNA Pre-anesthesia Checklist: Patient identified, Emergency Drugs available, Suction available and Patient being monitored Patient Re-evaluated:Patient Re-evaluated prior to induction Oxygen Delivery Method: Circle system utilized Preoxygenation: Pre-oxygenation with 100% oxygen Induction Type: IV induction Ventilation: Mask ventilation without difficulty Laryngoscope Size: Mac and 3 Grade View: Grade II Tube type: Oral Number of attempts: 1 Airway Equipment and Method: Stylet and Oral airway Placement Confirmation: ETT inserted through vocal cords under direct vision,  positive ETCO2 and breath sounds checked- equal and bilateral Secured at: 21 cm Tube secured with: Tape Dental Injury: Teeth and Oropharynx as per pre-operative assessment  Comments: Easy atraumatic intubation.  Pt stated had TMJ pain on R yesterday, no popping felt with DL

## 2018-06-27 NOTE — Transfer of Care (Signed)
Immediate Anesthesia Transfer of Care Note  Patient: Anne Brown  Procedure(s) Performed: ANTERIOR (CYSTOCELE) AND POSTERIOR REPAIR (RECTOCELE) (N/A ) HYSTEROSCOPY WITH NOVASURE (N/A )  Patient Location: PACU  Anesthesia Type:General  Level of Consciousness: sedated, drowsy and patient cooperative  Airway & Oxygen Therapy: Patient Spontanous Breathing and Patient connected to nasal cannula oxygen  Post-op Assessment: Report given to RN and Post -op Vital signs reviewed and stable  Post vital signs: Reviewed and stable  Last Vitals:  Vitals Value Taken Time  BP    Temp    Pulse 87 06/27/2018  2:22 PM  Resp    SpO2 100 % 06/27/2018  2:22 PM  Vitals shown include unvalidated device data.  Last Pain:  Vitals:   06/27/18 0841  TempSrc:   PainSc: 6       Patients Stated Pain Goal: 5 (06/27/18 0841)  Complications: No apparent anesthesia complications

## 2018-06-27 NOTE — Op Note (Signed)
Preop Diagnosis: Menorrhagia, Cystocele and Rectocele   Postop Diagnosis: Menorrhagia, Cystocele and Rectocele  Procedure: ANTERIOR (CYSTOCELE) AND POSTERIOR REPAIR (RECTOCELE) HYSTEROSCOPY DILATION AND CURETTAGE WITH NOVASURE   Anesthesia: General   Anesthesiologist: Arta Bruce, MD   Attending: Osborn Coho, MD   Assistant: E.Lowell Guitar, PA-C  Findings: Large cystocele (grade 3) and rectocele.  No intracavitary lesions. Cavity Length 4.0cc Width 4.0cm Power 88 watts Time 71 secs  Pathology: None  Fluids: 1000 cc  UOP: 100 cc  EBL: 70 cc  Complications: None  Procedure: The patient was taken to the operating room after the risks, benefits and alternatives discussed with the patient. The patient verbalized understanding and consent signed and witnessed. The patient was given a spinal per anesthesia and prepped and draped in the normal sterile fashion and Time Out performed per protocol. A bivalve speculum was placed in the patient's vagina and the anterior lip of the cervix was grasped with a single tooth tenaculum. A paracervical block was administered using a total of 10 cc of 1% lidocaine. The uterus sounded to 7 cm. The cervix was dilated for passage of the hysteroscope. The hysteroscope was introduced into the uterine cavity and findings as noted above. Sharp curettage was performed until a gritty texture was noted and currettings sent to pathology. The hysteroscope was reintroduced and no intracavitary lesions were noted. The Novasure instrument was introduced and ablation performed without difficulty. Hysteroscope reintroduced and good ablation results were noted.    A weighted speculum was placed in the patient's vagina and the anterior vaginal wall was retracted using Deaver retractors. The anterior vaginal wall was injected with dilute pitressin and the anterior vaginal wall was then incised and dissected away from the underlying layer of tissue. The cystocele was  repaired with plication stitches. The excess vaginal wall tissue was excised and repaired with interrupted stitches of 2-0 Vicryl. Attention was then turned to the posterior vaginal wall where dilute Pitressin was injected. The posterior vaginal wall was incised and the underlying tissue was dissected away from the posterior vaginal wall. The rectocele was repaired using plication stitches of 2-0 Vicryl. Excess posterior vaginal wall tissue was excised and the posterior vaginal wall repaired with 2-0 vicryl via a running interlocking stitch. The perineum was repaired with 2-0 Vicryl via a subcuticular stitch. The vagina was packed with estrogen-soaked packing. The patient tolerated the procedure well and was awaiting return to the recovery room in good condition.  All instruments were removed. Sponge lap and needle count was correct. The patient tolerated the procedure well and was returned to the recovery room in good condition.

## 2018-06-27 NOTE — Anesthesia Postprocedure Evaluation (Signed)
Anesthesia Post Note  Patient: Anne Brown  Procedure(s) Performed: ANTERIOR (CYSTOCELE) AND POSTERIOR REPAIR (RECTOCELE) (N/A ) HYSTEROSCOPY WITH NOVASURE (N/A )     Anesthesia Type: General    Last Vitals:  Vitals:   06/27/18 1530 06/27/18 1545  BP: 113/69 108/68  Pulse: 90 88  Resp: 11 15  Temp:    SpO2: 97% 92%    Last Pain:  Vitals:   06/27/18 1530  TempSrc:   PainSc: 5                  Richanda Darin DAVID

## 2018-06-28 ENCOUNTER — Encounter (HOSPITAL_BASED_OUTPATIENT_CLINIC_OR_DEPARTMENT_OTHER): Payer: Self-pay | Admitting: Obstetrics and Gynecology

## 2018-06-28 DIAGNOSIS — N812 Incomplete uterovaginal prolapse: Secondary | ICD-10-CM | POA: Diagnosis not present

## 2018-06-28 LAB — CBC
HCT: 35.5 % — ABNORMAL LOW (ref 36.0–46.0)
Hemoglobin: 11.4 g/dL — ABNORMAL LOW (ref 12.0–15.0)
MCH: 31.4 pg (ref 26.0–34.0)
MCHC: 32.1 g/dL (ref 30.0–36.0)
MCV: 97.8 fL (ref 80.0–100.0)
Platelets: 240 10*3/uL (ref 150–400)
RBC: 3.63 MIL/uL — ABNORMAL LOW (ref 3.87–5.11)
RDW: 13.9 % (ref 11.5–15.5)
WBC: 11.6 10*3/uL — ABNORMAL HIGH (ref 4.0–10.5)
nRBC: 0 % (ref 0.0–0.2)

## 2018-06-28 MED ORDER — OXYCODONE-ACETAMINOPHEN 5-325 MG PO TABS
ORAL_TABLET | ORAL | Status: AC
  Filled 2018-06-28: qty 1

## 2018-06-28 MED ORDER — OXYCODONE-ACETAMINOPHEN 5-325 MG PO TABS: ORAL_TABLET | ORAL | 0 refills | Status: DC

## 2018-06-28 MED ORDER — PROMETHAZINE HCL 12.5 MG PO TABS: ORAL_TABLET | ORAL | 0 refills | Status: DC

## 2018-06-28 MED ORDER — IBUPROFEN 200 MG PO TABS
ORAL_TABLET | ORAL | Status: AC
  Filled 2018-06-28: qty 3

## 2018-06-28 MED ORDER — IBUPROFEN 600 MG PO TABS: ORAL_TABLET | ORAL | 1 refills | Status: DC

## 2018-06-28 NOTE — Progress Notes (Signed)
Discharge instruction given,showed understand well. Await for husband to pick her up

## 2018-06-28 NOTE — Progress Notes (Signed)
Anne Brown is a33 y.o.  [id number]  Post Op Date # 1: S/P Hysteroscopy, Dilatation, Curettage,  Novasure Endometrial Ablation, Anterior-Posterior Colporrhaphy and Cystoscopy  Subjective: Patient is Doing well postoperatively. Patient has Pain is controlled with current analgesics. Medications being used: prescription NSAID's including Ibuprofen 600 mg and narcotic analgesics including Percocet 5/325 mg. The patient has ambulated in the halls without difficulty, tolerated a regular diet and has voided 150 cc of urine with sensation of complete bladder emptying.  Objective: Vital signs in last 24 hours: Temp:  [97.6 F (36.4 C)-98.8 F (37.1 C)] 98.3 F (36.8 C) (06/05 0600) Pulse Rate:  [74-91] 78 (06/05 0600) Resp:  [11-20] 16 (06/05 0600) BP: (102-126)/(60-78) 102/60 (06/05 0600) SpO2:  [92 %-100 %] 99 % (06/05 0600) Weight:  [73.7 kg] 73.7 kg (06/04 0758)  Intake/Output from previous day: 06/04 0701 - 06/05 0700 In: 3395 [P.O.:420; I.V.:2875] Out: 1345 [Urine:1275] Intake/Output this shift: No intake/output data recorded. Recent Labs  Lab 06/24/18 1434 06/28/18 0457  WBC 7.6 11.6*  HGB 13.6 11.4*  HCT 44.0 35.5*  PLT 336 240     Recent Labs  Lab 06/24/18 1434  NA 141  K 4.0  CL 104  CO2 30  BUN 8  CREATININE 0.57  CALCIUM 9.3  GLUCOSE 106*    EXAM: General: alert, cooperative and no distress Resp: clear to auscultation bilaterally Cardio: regular rate and rhythm, S1, S2 normal, no murmur, click, rub or gallop GI: bowel sounds present, soft and non-tender Extremities: Homans sign is negative, no sign of DVT Vaginal Bleeding: faint single linear stain on perineal pad   Assessment: s/p Procedure(s): ANTERIOR (CYSTOCELE) AND POSTERIOR REPAIR (RECTOCELE) HYSTEROSCOPY WITH NOVASURE: stable, progressing well and tolerating diet  Plan: Discharge home  LOS: 0 days    Henreitta Leber, PA-C 06/28/2018 7:41 AM

## 2018-06-28 NOTE — Discharge Summary (Signed)
Physician Discharge Summary  Patient ID: Analyssa Brown MRN: [id number] DOB/AGE: [birth year] 47 y.o.  Admit date: 06/27/2018 Discharge date: 06/28/2018   Discharge Diagnoses: Abnormal Uterine Bleeding, Symptomatic Cystocele and Rectocele and Stress Urinary Incontinence. 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  Post operative course was unremarkable with the patient resuming bowel and bladder function by post operative day #1 and was therefore deemed ready for discharge home.  Discharge hemoglobin was 11.6.  Disposition: Discharge disposition: 01-Home or Self Care       Discharge Medications:  Allergies as of 06/28/2018   No Known Allergies     Medication List    TAKE these medications   ACETAMINOPHEN-BUTALBITAL 50-325 MG Tabs Take by mouth once as needed. Reported on 05/28/2015   amitriptyline 50 MG tablet Commonly known as:  ELAVIL Take 50 mg by mouth at bedtime.   buPROPion 150 MG 12 hr tablet Commonly known as:  WELLBUTRIN SR Take 150 mg by mouth at bedtime.   cetirizine 10 MG tablet Commonly known as:  ZYRTEC Take 10 mg by mouth at bedtime.   cyclobenzaprine 5 MG tablet Commonly known as:  FLEXERIL Take 5 mg by mouth 3 (three) times daily as needed for muscle spasms.   Emgality 120 MG/ML Soaj Generic drug:  Galcanezumab-gnlm Inject into the skin. Once month   ibuprofen 600 MG tablet Commonly known as:  ADVIL take 1 tablet po pc every 6 hours for 5 days then prn-post operative pain   LORazepam 0.5 MG tablet Commonly known as:  ATIVAN Take 0.5 mg by mouth every 6 (six) hours as needed for anxiety.   methylphenidate 20 MG tablet Commonly known as:  RITALIN Take  20 mg by mouth 2 (two) times daily.   oxyCODONE-acetaminophen 5-325 MG tablet Commonly known as:  Percocet take 1 tablet every 6 hours as needed for post operative pain   promethazine 12.5 MG tablet Commonly known as:  PHENERGAN take 1 tablet every 6 hours as needed for nausea          Follow-up: Dr. Woodroe Mode. Su Hilt on July 09, 2018 at 1:45 p.m.     Signed: Henreitta Leber, PA-C 06/28/2018, 7:57 AM

## 2018-06-28 NOTE — Discharge Instructions (Signed)
Call [facility] @ [phone] if:  You have a temperature greater than or equal to 100.4 degrees Farenheit orally You have pain that is not made better by the pain medication given and taken as directed You have excessive bleeding or problems urinating  Take Colace (Docusate Sodium/Stool Softener) 100 mg 2-4  times daily while taking narcotic pain medicine to avoid constipation or until bowel movements are regular. Take Ibuprofen 600 mg with food every 6 hours for 5 days then as needed for pain  You may drive after 1 week You may walk up steps  You may shower  You may resume a regular diet   Do not lift over 15 pounds for 6 weeks Avoid anything in vagina for 6 weeks

## 2018-06-28 NOTE — Progress Notes (Signed)
Removed foley catheter and vaginal packing per order.  Pt tolerated procedure well.  Moderate amt of serosanguinous drng on peri pad. Changed pad and mesh panty.  Instructed pt to drink fluids and call when needing to empty bladder

## 2018-07-24 ENCOUNTER — Other Ambulatory Visit (HOSPITAL_COMMUNITY): Payer: Self-pay | Admitting: Family Medicine

## 2018-07-24 DIAGNOSIS — R4701 Aphasia: Secondary | ICD-10-CM

## 2018-07-30 ENCOUNTER — Other Ambulatory Visit: Payer: Self-pay

## 2018-07-30 ENCOUNTER — Ambulatory Visit (HOSPITAL_COMMUNITY)
Admission: RE | Admit: 2018-07-30 | Discharge: 2018-07-30 | Disposition: A | Discharge status: Home / Self Care | Payer: 59 | Source: Ambulatory Visit | Attending: Family Medicine | Admitting: Family Medicine

## 2018-07-30 DIAGNOSIS — R4701 Aphasia: Secondary | ICD-10-CM | POA: Insufficient documentation

## 2018-08-15 ENCOUNTER — Encounter (HOSPITAL_BASED_OUTPATIENT_CLINIC_OR_DEPARTMENT_OTHER): Payer: Self-pay | Admitting: Obstetrics and Gynecology

## 2018-08-28 ENCOUNTER — Other Ambulatory Visit: Payer: Self-pay | Admitting: Family Medicine

## 2018-08-28 ENCOUNTER — Other Ambulatory Visit (HOSPITAL_COMMUNITY): Payer: Self-pay | Admitting: Family Medicine

## 2018-08-28 DIAGNOSIS — R42 Dizziness and giddiness: Secondary | ICD-10-CM

## 2018-09-03 ENCOUNTER — Ambulatory Visit (HOSPITAL_COMMUNITY): Payer: 59

## 2018-09-06 ENCOUNTER — Ambulatory Visit
Admission: RE | Admit: 2018-09-06 | Discharge: 2018-09-06 | Disposition: A | Discharge status: Home / Self Care | Payer: 59 | Source: Ambulatory Visit | Attending: Family Medicine | Admitting: Family Medicine

## 2018-09-06 DIAGNOSIS — R42 Dizziness and giddiness: Secondary | ICD-10-CM

## 2018-09-06 MED ORDER — IOPAMIDOL (ISOVUE-370) INJECTION 76%
75.0000 mL | Freq: Once | INTRAVENOUS | Status: AC | PRN
  Administered 2018-09-06: 75 mL via INTRAVENOUS

## 2019-03-27 ENCOUNTER — Other Ambulatory Visit: Payer: Self-pay

## 2019-03-28 MED ORDER — METHYLPHENIDATE HCL 20 MG PO TABS: 20.0000 mg | ORAL_TABLET | Freq: Two times a day (BID) | ORAL | 0 refills | Status: DC

## 2019-04-29 ENCOUNTER — Other Ambulatory Visit: Payer: Self-pay

## 2019-04-29 MED ORDER — METHYLPHENIDATE HCL 20 MG PO TABS: 20.0000 mg | ORAL_TABLET | Freq: Two times a day (BID) | ORAL | 0 refills | Status: DC

## 2019-04-30 ENCOUNTER — Other Ambulatory Visit: Payer: Self-pay

## 2019-05-26 ENCOUNTER — Other Ambulatory Visit: Payer: Self-pay | Admitting: Family Medicine

## 2019-06-02 ENCOUNTER — Other Ambulatory Visit: Payer: Self-pay | Admitting: Physician Assistant

## 2019-06-03 ENCOUNTER — Other Ambulatory Visit: Payer: Self-pay | Admitting: Family Medicine

## 2019-06-09 ENCOUNTER — Other Ambulatory Visit: Payer: Self-pay

## 2019-06-09 MED ORDER — METHYLPHENIDATE HCL 20 MG PO TABS: 20.0000 mg | ORAL_TABLET | Freq: Two times a day (BID) | ORAL | 0 refills | Status: DC

## 2019-07-10 ENCOUNTER — Other Ambulatory Visit: Payer: Self-pay

## 2019-07-10 MED ORDER — METHYLPHENIDATE HCL 20 MG PO TABS: 20.0000 mg | ORAL_TABLET | Freq: Two times a day (BID) | ORAL | 0 refills | Status: DC

## 2019-08-14 ENCOUNTER — Other Ambulatory Visit: Payer: Self-pay | Admitting: Physician Assistant

## 2019-08-14 ENCOUNTER — Other Ambulatory Visit: Payer: Self-pay | Admitting: Family Medicine

## 2019-08-19 ENCOUNTER — Other Ambulatory Visit: Payer: Self-pay

## 2019-08-19 MED ORDER — METHYLPHENIDATE HCL 20 MG PO TABS: 20.0000 mg | ORAL_TABLET | Freq: Two times a day (BID) | ORAL | 0 refills | Status: DC

## 2019-09-22 ENCOUNTER — Other Ambulatory Visit: Payer: Self-pay

## 2019-09-22 MED ORDER — METHYLPHENIDATE HCL 20 MG PO TABS: 20.0000 mg | ORAL_TABLET | Freq: Two times a day (BID) | ORAL | 0 refills | Status: DC

## 2019-10-27 ENCOUNTER — Other Ambulatory Visit: Payer: Self-pay

## 2019-10-27 MED ORDER — BUTALBITAL-APAP-CAFFEINE 50-325-40 MG PO TABS: ORAL_TABLET | ORAL | 0 refills | Status: DC

## 2019-10-27 MED ORDER — METHYLPHENIDATE HCL 20 MG PO TABS: 20.0000 mg | ORAL_TABLET | Freq: Two times a day (BID) | ORAL | 0 refills | Status: DC

## 2019-10-29 ENCOUNTER — Telehealth (INDEPENDENT_AMBULATORY_CARE_PROVIDER_SITE_OTHER): Payer: 59 | Admitting: Family Medicine

## 2019-10-29 ENCOUNTER — Encounter: Payer: Self-pay | Admitting: Family Medicine

## 2019-10-29 DIAGNOSIS — F988 Other specified behavioral and emotional disorders with onset usually occurring in childhood and adolescence: Secondary | ICD-10-CM

## 2019-10-29 DIAGNOSIS — F5101 Primary insomnia: Secondary | ICD-10-CM | POA: Diagnosis not present

## 2019-10-29 DIAGNOSIS — F330 Major depressive disorder, recurrent, mild: Secondary | ICD-10-CM

## 2019-10-29 DIAGNOSIS — G43009 Migraine without aura, not intractable, without status migrainosus: Secondary | ICD-10-CM | POA: Diagnosis not present

## 2019-10-29 MED ORDER — BUPROPION HCL ER (SR) 150 MG PO TB12: 150.0000 mg | ORAL_TABLET | Freq: Every evening | ORAL | 1 refills | Status: DC | PRN

## 2019-10-29 MED ORDER — BUTALBITAL-APAP-CAFFEINE 50-325-40 MG PO TABS: ORAL_TABLET | ORAL | 1 refills | Status: DC

## 2019-10-29 MED ORDER — AMITRIPTYLINE HCL 50 MG PO TABS: 50.0000 mg | ORAL_TABLET | Freq: Every day | ORAL | 1 refills | Status: DC

## 2019-10-29 MED ORDER — CYCLOBENZAPRINE HCL 5 MG PO TABS: 5.0000 mg | ORAL_TABLET | Freq: Three times a day (TID) | ORAL | 1 refills | Status: DC | PRN

## 2019-10-29 MED ORDER — METHYLPHENIDATE HCL 20 MG PO TABS: 20.0000 mg | ORAL_TABLET | Freq: Three times a day (TID) | ORAL | 0 refills | Status: DC

## 2019-10-29 MED ORDER — CLONAZEPAM 0.5 MG PO TABS: 0.5000 mg | ORAL_TABLET | Freq: Two times a day (BID) | ORAL | 2 refills | Status: DC | PRN

## 2019-10-29 MED ORDER — PROMETHAZINE HCL 12.5 MG PO TABS: ORAL_TABLET | ORAL | 3 refills | Status: DC

## 2019-10-29 NOTE — Progress Notes (Signed)
Virtual Visit via Video Note   This visit type was conducted due to national recommendations for restrictions regarding the COVID-19 Pandemic (e.g. social distancing) in an effort to limit this patient's exposure and mitigate transmission in our community.  Due to her co-morbid illnesses, this patient is at least at moderate risk for complications without adequate follow up.  This format is felt to be most appropriate for this patient at this time.  All issues noted in this document were discussed and addressed.  A limited physical exam was performed with this format.  A verbal consent was obtained for the virtual visit.   Patient Location:home Provider Location:office Evaluation Performed:  Follow-Up Visit  Subjective:  Patient ID: Anne Brown, female    DOB: 1970-08-18  Age: 49 y.o. MRN: [id number]  Chief Complaint  Patient presents with  . ADHD    HPI  ADHD - Ritalin doing well. Doesn't quite last the whole day. She takes 20 mg in am and 20 mg mid way through the day.   Migraines: twice a month. Fiorecet helps with ibuprofen. Taking amitrptyline helps and helps sleeps. Emgality helped, but it is too expensive.   Depression and anxiety - wellbutrin and clonazepam 0.5 mg one twice a day. Denies depression or anhedonia. Her husband's depression has improved and he is seeing a counselor now and psychiatry   Lumbar strain: comes and goes. Has flexeril if needed.    Current Outpatient Medications on File Prior to Visit  Medication Sig Dispense Refill  . cetirizine (ZYRTEC) 10 MG tablet Take 10 mg by mouth at bedtime.     Marland Kitchen ibuprofen (ADVIL) 600 MG tablet take 1 tablet po pc every 6 hours for 5 days then prn-post operative pain 30 tablet 1   No current facility-administered medications on file prior to visit.   Past Medical History:  Diagnosis Date  . Abnormal Pap smear   . ADHD   . Anxiety   . Family history of adverse reaction to anesthesia    mother n/v severe  . Headache     migraines  . Lower back pain   . LSIL (low grade squamous intraepithelial lesion) on Pap smear 10/15/2007   Past Surgical History:  Procedure Laterality Date  . ANTERIOR AND POSTERIOR REPAIR N/A 06/27/2018   Procedure: ANTERIOR (CYSTOCELE) AND POSTERIOR REPAIR (RECTOCELE);  Surgeon: Osborn Coho, MD;  Location: [facility];  Service: Gynecology;  Laterality: N/A;  . DILATION AND CURETTAGE OF UTERUS  1992  . DILATION AND CURETTAGE OF UTERUS  2009  . ECTOPIC PREGNANCY SURGERY  01/2008   After BTL  . HYSTEROSCOPY WITH NOVASURE N/A 06/27/2018   Procedure: HYSTEROSCOPY WITH NOVASURE;  Surgeon: Osborn Coho, MD;  Location: [facility];  Service: Gynecology;  Laterality: N/A;  . TUBAL LIGATION      Family History  Problem Relation Age of Onset  . Depression Mother    Social History   Socioeconomic History  . Marital status: Married    Spouse name: Not on file  . Number of children: Not on file  . Years of education: Not on file  . Highest education level: Not on file  Occupational History  . Not on file  Tobacco Use  . Smoking status: Never Smoker  . Smokeless tobacco: Never Used  Vaping Use  . Vaping Use: Never used  Substance and Sexual Activity  . Alcohol use: Yes    Alcohol/week: 0.0 standard drinks    Comment: 3 glasses  a week   . Drug use: No  . Sexual activity: Yes    Birth control/protection: None, Surgical    Comment: 1st intercourse- 17, partners-2  Other Topics Concern  . Not on file  Social History Narrative  . Not on file   Social Determinants of Health   Financial Resource Strain:   . Difficulty of Paying Living Expenses: Not on file  Food Insecurity:   . Worried About Programme researcher, broadcasting/film/video in the Last Year: Not on file  . Ran Out of Food in the Last Year: Not on file  Transportation Needs:   . Lack of Transportation (Medical): Not on file  . Lack of Transportation (Non-Medical): Not on file  Physical Activity:   . Days  of Exercise per Week: Not on file  . Minutes of Exercise per Session: Not on file  Stress:   . Feeling of Stress : Not on file  Social Connections:   . Frequency of Communication with Friends and Family: Not on file  . Frequency of Social Gatherings with Friends and Family: Not on file  . Attends Religious Services: Not on file  . Active Member of Clubs or Organizations: Not on file  . Attends Banker Meetings: Not on file  . Marital Status: Not on file    Review of Systems  Constitutional: Negative for chills, fatigue and fever.  HENT: Negative for congestion, ear pain and sore throat.   Respiratory: Negative for cough and shortness of breath.   Cardiovascular: Negative for chest pain.  Gastrointestinal: Negative for abdominal pain, constipation, diarrhea, nausea and vomiting.  Genitourinary: Negative for dysuria and urgency.  Musculoskeletal: Negative for arthralgias and myalgias.  Skin: Negative for rash.  Neurological: Positive for headaches. Negative for dizziness.  Psychiatric/Behavioral: Negative for dysphoric mood. The patient is not nervous/anxious.      Objective:  There were no vitals taken for this visit.  BP/Weight 06/28/2018 06/27/2018 05/28/2015  Systolic BP 102 - 119  Diastolic BP 60 - 80  Wt. (Lbs) - 162.5 169.8  BMI - 30.7 31.05    Physical Exam Constitutional:      Appearance: Normal appearance. She is normal weight.  Pulmonary:     Effort: Pulmonary effort is normal.  Neurological:     Mental Status: She is alert and oriented to person, place, and time.  Psychiatric:        Mood and Affect: Mood normal.        Behavior: Behavior normal.     Diabetic Foot Exam - Simple   No data filed       Lab Results  Component Value Date   WBC 11.6 (H) 06/28/2018   HGB 11.4 (L) 06/28/2018   HCT 35.5 (L) 06/28/2018   PLT 240 06/28/2018   GLUCOSE 106 (H) 06/24/2018   CHOL 131 02/27/2014   TRIG 50 05/09/2012   HDL 71 05/09/2012   LDLCALC 52  05/09/2012   ALT 18 02/27/2014   AST 18 02/27/2014   NA 141 06/24/2018   K 4.0 06/24/2018   CL 104 06/24/2018   CREATININE 0.57 06/24/2018   BUN 8 06/24/2018   CO2 30 06/24/2018   TSH 1.725 02/27/2014      Assessment & Plan:  1. Migraine without aura and without status migrainosus, not intractable The current medical regimen is effective;  continue present plan and medications. - promethazine (PHENERGAN) 12.5 MG tablet; take 1 tablet every 6 hours as needed for nausea  Dispense: 30 tablet;  Refill: 3 - butalbital-acetaminophen-caffeine (FIORICET) 50-325-40 MG tablet; TAKE 1 TO 2 TABLETS BY MOUTH EVERY 6 HOURS AS NEEDED FOR HEADACHE  Dispense: 30 tablet; Refill: 1  2. Mild recurrent major depression (HCC) The current medical regimen is effective;  continue present plan and medications. - clonazePAM (KLONOPIN) 0.5 MG tablet; Take 1 tablet (0.5 mg total) by mouth 2 (two) times daily as needed. for anxiety  Dispense: 60 tablet; Refill: 2 - buPROPion (WELLBUTRIN SR) 150 MG 12 hr tablet; Take 1 tablet (150 mg total) by mouth at bedtime as needed.  Dispense: 90 tablet; Refill: 1  3. Attention deficit disorder (ADD) without hyperactivity Increase ritalin to 20 mg 2 in am and 1 midday. Pt may adjust timing of dosing If wishes. No more than 3 per day.  - methylphenidate (RITALIN) 20 MG tablet; Take 1 tablet (20 mg total) by mouth 3 (three) times daily with meals.  Dispense: 90 tablet; Refill: 0  4. Primary insomnia The current medical regimen is effective;  continue present plan and medications. - amitriptyline (ELAVIL) 50 MG tablet; Take 1 tablet (50 mg total) by mouth at bedtime.  Dispense: 90 tablet; Refill: 1     There are no diagnoses linked to this encounter.   Meds ordered this encounter  Medications  . promethazine (PHENERGAN) 12.5 MG tablet    Sig: take 1 tablet every 6 hours as needed for nausea    Dispense:  30 tablet    Refill:  3  . methylphenidate (RITALIN) 20 MG tablet     Sig: Take 1 tablet (20 mg total) by mouth 3 (three) times daily with meals.    Dispense:  90 tablet    Refill:  0  . cyclobenzaprine (FLEXERIL) 5 MG tablet    Sig: Take 1 tablet (5 mg total) by mouth 3 (three) times daily as needed for muscle spasms.    Dispense:  90 tablet    Refill:  1  . clonazePAM (KLONOPIN) 0.5 MG tablet    Sig: Take 1 tablet (0.5 mg total) by mouth 2 (two) times daily as needed. for anxiety    Dispense:  60 tablet    Refill:  2  . butalbital-acetaminophen-caffeine (FIORICET) 50-325-40 MG tablet    Sig: TAKE 1 TO 2 TABLETS BY MOUTH EVERY 6 HOURS AS NEEDED FOR HEADACHE    Dispense:  30 tablet    Refill:  1  . buPROPion (WELLBUTRIN SR) 150 MG 12 hr tablet    Sig: Take 1 tablet (150 mg total) by mouth at bedtime as needed.    Dispense:  90 tablet    Refill:  1  . amitriptyline (ELAVIL) 50 MG tablet    Sig: Take 1 tablet (50 mg total) by mouth at bedtime.    Dispense:  90 tablet    Refill:  1    No orders of the defined types were placed in this encounter.   Time:  Today, I have spent 12 minutes with the patient with telehealth technology discussing the above problems.    Follow Up:  In Person in 6 month(s)  An After Visit Summary was printed and given to the patient.  Blane Ohara [facility] [phone]

## 2019-12-08 ENCOUNTER — Other Ambulatory Visit: Payer: Self-pay

## 2019-12-08 DIAGNOSIS — F988 Other specified behavioral and emotional disorders with onset usually occurring in childhood and adolescence: Secondary | ICD-10-CM

## 2019-12-08 MED ORDER — METHYLPHENIDATE HCL 20 MG PO TABS: 20.0000 mg | ORAL_TABLET | Freq: Three times a day (TID) | ORAL | 0 refills | Status: DC

## 2020-01-14 ENCOUNTER — Ambulatory Visit (INDEPENDENT_AMBULATORY_CARE_PROVIDER_SITE_OTHER): Payer: 59 | Admitting: Family Medicine

## 2020-01-14 ENCOUNTER — Other Ambulatory Visit: Payer: Self-pay

## 2020-01-14 ENCOUNTER — Encounter: Payer: Self-pay | Admitting: Family Medicine

## 2020-01-14 VITALS — BP 128/70 | HR 83 | Temp 97.5°F | Resp 16 | Ht 61.0 in | Wt 165.8 lb

## 2020-01-14 DIAGNOSIS — G43011 Migraine without aura, intractable, with status migrainosus: Secondary | ICD-10-CM | POA: Diagnosis not present

## 2020-01-14 DIAGNOSIS — M25551 Pain in right hip: Secondary | ICD-10-CM | POA: Diagnosis not present

## 2020-01-14 DIAGNOSIS — M545 Low back pain, unspecified: Secondary | ICD-10-CM | POA: Diagnosis not present

## 2020-01-14 DIAGNOSIS — M25552 Pain in left hip: Secondary | ICD-10-CM | POA: Diagnosis not present

## 2020-01-14 MED ORDER — PROMETHAZINE HCL 25 MG/ML IJ SOLN
25.0000 mg | Freq: Once | INTRAMUSCULAR | Status: AC
  Administered 2020-01-14: 25 mg via INTRAMUSCULAR

## 2020-01-14 MED ORDER — KETOROLAC TROMETHAMINE 60 MG/2ML IM SOLN
60.0000 mg | Freq: Once | INTRAMUSCULAR | Status: AC
  Administered 2020-01-14: 60 mg via INTRAMUSCULAR

## 2020-01-14 NOTE — Patient Instructions (Signed)
Ajovy samples given.

## 2020-01-14 NOTE — Progress Notes (Signed)
Acute Office Visit  Subjective:    Patient ID: Anne Brown, female    DOB: October 26, 1970, 49 y.o.   MRN: [id number]  Chief Complaint  Patient presents with  . Migraine    Started about a week ago. States she felt better yesterday evening but woke up with migraine this morning. States she has taken a muscle relaxer and Butalbital this morning.    HPI: Patient presents today with a migraine and nauseated. States it has been occurring off and on for about a week.  She tried muscle relaxer, butalbital (4 am) and promethazine at 9 am. Headache 8/10.  Photophobia. Nausea. NO vomiting. No aura today. It usually starts with tension in her neck and she will take ibuprofen.  Imitrex did not help.  Monthly: Emgallity worked for 3 months and is no longer working as well.   Ajovy worked well, but then insurance would not approve.  No ubrelvy.  Pt tried topamax briefly in the past.    Past Medical History:  Diagnosis Date  . Abnormal Pap smear   . ADHD   . Anxiety   . Family history of adverse reaction to anesthesia    mother n/v severe  . Headache    migraines  . Lower back pain   . LSIL (low grade squamous intraepithelial lesion) on Pap smear 10/15/2007    Past Surgical History:  Procedure Laterality Date  . ANTERIOR AND POSTERIOR REPAIR N/A 06/27/2018   Procedure: ANTERIOR (CYSTOCELE) AND POSTERIOR REPAIR (RECTOCELE);  Surgeon: Osborn Coho, MD;  Location: [facility];  Service: Gynecology;  Laterality: N/A;  . DILATION AND CURETTAGE OF UTERUS  1992  . DILATION AND CURETTAGE OF UTERUS  2009  . ECTOPIC PREGNANCY SURGERY  01/2008   After BTL  . HYSTEROSCOPY WITH NOVASURE N/A 06/27/2018   Procedure: HYSTEROSCOPY WITH NOVASURE;  Surgeon: Osborn Coho, MD;  Location: [facility];  Service: Gynecology;  Laterality: N/A;  . TUBAL LIGATION      Family History  Problem Relation Age of Onset  . Depression Mother     Social History   Socioeconomic History   . Marital status: Married    Spouse name: Not on file  . Number of children: Not on file  . Years of education: Not on file  . Highest education level: Not on file  Occupational History  . Not on file  Tobacco Use  . Smoking status: Never Smoker  . Smokeless tobacco: Never Used  Vaping Use  . Vaping Use: Never used  Substance and Sexual Activity  . Alcohol use: Yes    Alcohol/week: 0.0 standard drinks    Comment: 3 glasses a week   . Drug use: No  . Sexual activity: Yes    Birth control/protection: None, Surgical    Comment: 1st intercourse- 17, partners-2  Other Topics Concern  . Not on file  Social History Narrative  . Not on file   Social Determinants of Health   Financial Resource Strain: Not on file  Food Insecurity: Not on file  Transportation Needs: Not on file  Physical Activity: Not on file  Stress: Not on file  Social Connections: Not on file  Intimate Partner Violence: Not on file    Outpatient Medications Prior to Visit  Medication Sig Dispense Refill  . amitriptyline (ELAVIL) 100 MG tablet Take 100 mg by mouth at bedtime.    Marland Kitchen amitriptyline (ELAVIL) 50 MG tablet Take 1 tablet (50 mg total) by mouth at bedtime.  90 tablet 1  . buPROPion (WELLBUTRIN SR) 150 MG 12 hr tablet Take 1 tablet (150 mg total) by mouth at bedtime as needed. 90 tablet 1  . butalbital-acetaminophen-caffeine (FIORICET) 50-325-40 MG tablet TAKE 1 TO 2 TABLETS BY MOUTH EVERY 6 HOURS AS NEEDED FOR HEADACHE 30 tablet 1  . cetirizine (ZYRTEC) 10 MG tablet Take 10 mg by mouth at bedtime.    . clonazePAM (KLONOPIN) 0.5 MG tablet Take 1 tablet (0.5 mg total) by mouth 2 (two) times daily as needed. for anxiety 60 tablet 2  . cyclobenzaprine (FLEXERIL) 5 MG tablet Take 1 tablet (5 mg total) by mouth 3 (three) times daily as needed for muscle spasms. 90 tablet 1  . ibuprofen (ADVIL) 600 MG tablet take 1 tablet po pc every 6 hours for 5 days then prn-post operative pain 30 tablet 1  . promethazine  (PHENERGAN) 12.5 MG tablet take 1 tablet every 6 hours as needed for nausea 30 tablet 3  . methylphenidate (RITALIN) 20 MG tablet Take 1 tablet (20 mg total) by mouth 3 (three) times daily with meals. 90 tablet 0   No facility-administered medications prior to visit.    No Known Allergies  Review of Systems  Constitutional: Negative for fatigue, fever and unexpected weight change.  HENT: Negative for congestion, ear pain, rhinorrhea and sore throat.   Respiratory: Negative for cough and shortness of breath.   Cardiovascular: Negative for chest pain and palpitations.  Gastrointestinal: Positive for nausea. Negative for abdominal pain, blood in stool, diarrhea and vomiting.  Endocrine: Negative for polydipsia, polyphagia and polyuria.  Genitourinary: Negative for dysuria and hematuria.  Musculoskeletal: Positive for arthralgias (BL Hip pain) and back pain (lumbar). Negative for myalgias.  Skin: Negative for rash.  Neurological: Positive for headaches. Negative for dizziness and weakness.       Objective:    Physical Exam Vitals reviewed.  Constitutional:      General: She is in acute distress (sitting in dark).     Appearance: Normal appearance.  Cardiovascular:     Rate and Rhythm: Normal rate and regular rhythm.     Heart sounds: Normal heart sounds.  Pulmonary:     Effort: Pulmonary effort is normal. No respiratory distress.     Breath sounds: Normal breath sounds.  Abdominal:     General: Abdomen is flat. Bowel sounds are normal.     Palpations: Abdomen is soft.     Tenderness: There is no abdominal tenderness.  Neurological:     Mental Status: She is alert and oriented to person, place, and time.  Psychiatric:        Mood and Affect: Mood normal.        Behavior: Behavior normal.     BP 128/70 (BP Location: Right Arm, Patient Position: Sitting, Cuff Size: Normal)   Pulse 83   Temp (!) 97.5 F (36.4 C) (Temporal)   Resp 16   Ht 5\' 1"  (1.549 m)   Wt 165 lb 12.8  oz (75.2 kg)   SpO2 97%   BMI 31.33 kg/m  Wt Readings from Last 3 Encounters:  01/14/20 165 lb 12.8 oz (75.2 kg)  06/27/18 162 lb 8 oz (73.7 kg)  05/28/15 169 lb 12.8 oz (77 kg)    Health Maintenance Due  Topic Date Due  . Hepatitis C Screening  Never done  . COVID-19 Vaccine (1) Never done  . HIV Screening  Never done  . TETANUS/TDAP  Never done  . COLONOSCOPY (Pts 45-62yrs Insurance  coverage will need to be confirmed)  Never done  . PAP SMEAR-Modifier  02/27/2017  . INFLUENZA VACCINE  08/24/2019    There are no preventive care reminders to display for this patient.   Lab Results  Component Value Date   TSH 1.725 02/27/2014   Lab Results  Component Value Date   WBC 11.6 (H) 06/28/2018   HGB 11.4 (L) 06/28/2018   HCT 35.5 (L) 06/28/2018   MCV 97.8 06/28/2018   PLT 240 06/28/2018   Lab Results  Component Value Date   NA 141 06/24/2018   K 4.0 06/24/2018   CO2 30 06/24/2018   GLUCOSE 106 (H) 06/24/2018   BUN 8 06/24/2018   CREATININE 0.57 06/24/2018   BILITOT 0.3 02/27/2014   ALKPHOS 75 02/27/2014   AST 18 02/27/2014   ALT 18 02/27/2014   PROT 6.9 02/27/2014   ALBUMIN 4.3 02/27/2014   CALCIUM 9.3 06/24/2018   ANIONGAP 7 06/24/2018   Lab Results  Component Value Date   CHOL 131 02/27/2014   Lab Results  Component Value Date   HDL 71 05/09/2012   Lab Results  Component Value Date   LDLCALC 52 05/09/2012   Lab Results  Component Value Date   TRIG 50 05/09/2012   Lab Results  Component Value Date   CHOLHDL 1.9 05/09/2012   No results found for: HGBA1C     Assessment & Plan:  1. Intractable migraine without aura and with status migrainosus  - Ajovy samples given.  -  Reyvow samples given - promethazine (PHENERGAN) injection 25 mg - ketorolac (TORADOL) injection 60 mg  2. Hip pain, bilateral Recommend tylenol and/or ibuprofen Consider xrays if not improving.   3. Lumbar pain Continue flexeril, ibuprofen, and/or tylenol.  Meds ordered  this encounter  Medications  . promethazine (PHENERGAN) injection 25 mg  . ketorolac (TORADOL) injection 60 mg    No orders of the defined types were placed in this encounter.   Follow-up: No follow-ups on file.  An After Visit Summary was printed and given to the patient.  Blane Ohara, MD [facility] [phone]

## 2020-01-15 ENCOUNTER — Ambulatory Visit: Payer: 59 | Admitting: Family Medicine

## 2020-01-21 ENCOUNTER — Telehealth: Payer: Self-pay

## 2020-01-21 ENCOUNTER — Other Ambulatory Visit: Payer: Self-pay | Admitting: Family Medicine

## 2020-01-21 DIAGNOSIS — F988 Other specified behavioral and emotional disorders with onset usually occurring in childhood and adolescence: Secondary | ICD-10-CM

## 2020-01-21 MED ORDER — METHYLPHENIDATE HCL 20 MG PO TABS: 20.0000 mg | ORAL_TABLET | Freq: Three times a day (TID) | ORAL | 0 refills | Status: DC

## 2020-01-21 MED ORDER — REYVOW 100 MG PO TABS: 1.0000 | ORAL_TABLET | Freq: Every day | ORAL | 3 refills | Status: DC | PRN

## 2020-01-25 ENCOUNTER — Encounter: Payer: Self-pay | Admitting: Family Medicine

## 2020-01-25 MED ORDER — AJOVY 225 MG/1.5ML ~~LOC~~ SOAJ: 225.0000 mg | SUBCUTANEOUS | 2 refills | Status: DC

## 2020-01-26 ENCOUNTER — Other Ambulatory Visit: Payer: Self-pay | Admitting: Nurse Practitioner

## 2020-01-26 ENCOUNTER — Telehealth: Payer: Self-pay

## 2020-01-26 NOTE — Telephone Encounter (Signed)
Anne Brown called to report that her back and hip pain are worse and she is ready to get xrays.  Xray orders sent to [facility] on [address] per Flonnie Hailstone, NP.

## 2020-01-27 ENCOUNTER — Ambulatory Visit
Admission: RE | Admit: 2020-01-27 | Discharge: 2020-01-27 | Disposition: A | Discharge status: Home / Self Care | Payer: 59 | Source: Ambulatory Visit | Attending: Nurse Practitioner | Admitting: Nurse Practitioner

## 2020-01-27 ENCOUNTER — Other Ambulatory Visit: Payer: Self-pay | Admitting: Nurse Practitioner

## 2020-01-27 DIAGNOSIS — M25552 Pain in left hip: Secondary | ICD-10-CM

## 2020-01-27 DIAGNOSIS — M25551 Pain in right hip: Secondary | ICD-10-CM

## 2020-01-27 DIAGNOSIS — M545 Low back pain, unspecified: Secondary | ICD-10-CM

## 2020-01-27 NOTE — Telephone Encounter (Signed)
PA submitted for Reyvow via covermymeds. PA denied.

## 2020-02-02 ENCOUNTER — Ambulatory Visit: Payer: 59 | Admitting: Family Medicine

## 2020-02-02 ENCOUNTER — Other Ambulatory Visit: Payer: Self-pay

## 2020-02-02 ENCOUNTER — Encounter: Payer: Self-pay | Admitting: Family Medicine

## 2020-02-02 VITALS — BP 124/76 | HR 94 | Temp 97.5°F | Ht 61.0 in | Wt 165.0 lb

## 2020-02-02 DIAGNOSIS — M25552 Pain in left hip: Secondary | ICD-10-CM

## 2020-02-02 DIAGNOSIS — M545 Low back pain, unspecified: Secondary | ICD-10-CM | POA: Diagnosis not present

## 2020-02-02 DIAGNOSIS — M25551 Pain in right hip: Secondary | ICD-10-CM | POA: Diagnosis not present

## 2020-02-02 MED ORDER — MELOXICAM 15 MG PO TABS: 15.0000 mg | ORAL_TABLET | Freq: Every day | ORAL | 0 refills | Status: DC

## 2020-02-02 NOTE — Progress Notes (Signed)
Acute Office Visit  Subjective:    Patient ID: Anne Brown, female    DOB: 1970-01-25, 50 y.o.   MRN: [id number]  Chief Complaint  Patient presents with  . Back Pain    HPI Patient is in today for worsening back pain that has been present for 1.5 weeks, pain radiates down bilateral legs. Describes pain as tight/pulling. Has been taking ibuprofen which helped however she stopped and has been taking a muscle relaxer. Was seen at [facility] and was given a kenalog injx which did not help.Pt has done physical therapy for 3 months in the past without any improvement. Back pain has been for years just worsening lately. Xrays done appeared essentially normal. Except SI joint may be narrowed.   Migraines: ajovy was approved. Reyvow was denied.   Past Medical History:  Diagnosis Date  . Abnormal Pap smear   . ADHD   . Anxiety   . Family history of adverse reaction to anesthesia    mother n/v severe  . Headache    migraines  . Lower back pain   . LSIL (low grade squamous intraepithelial lesion) on Pap smear 10/15/2007    Past Surgical History:  Procedure Laterality Date  . ANTERIOR AND POSTERIOR REPAIR N/A 06/27/2018   Procedure: ANTERIOR (CYSTOCELE) AND POSTERIOR REPAIR (RECTOCELE);  Surgeon: Osborn Coho, MD;  Location: [facility];  Service: Gynecology;  Laterality: N/A;  . DILATION AND CURETTAGE OF UTERUS  1992  . DILATION AND CURETTAGE OF UTERUS  2009  . ECTOPIC PREGNANCY SURGERY  01/2008   After BTL  . HYSTEROSCOPY WITH NOVASURE N/A 06/27/2018   Procedure: HYSTEROSCOPY WITH NOVASURE;  Surgeon: Osborn Coho, MD;  Location: [facility];  Service: Gynecology;  Laterality: N/A;  . TUBAL LIGATION      Family History  Problem Relation Age of Onset  . Depression Mother     Social History   Socioeconomic History  . Marital status: Married    Spouse name: Not on file  . Number of children: Not on file  . Years of education: Not on  file  . Highest education level: Not on file  Occupational History  . Not on file  Tobacco Use  . Smoking status: Never Smoker  . Smokeless tobacco: Never Used  Vaping Use  . Vaping Use: Never used  Substance and Sexual Activity  . Alcohol use: Yes    Alcohol/week: 0.0 standard drinks    Comment: 3 glasses a week   . Drug use: No  . Sexual activity: Yes    Birth control/protection: None, Surgical    Comment: 1st intercourse- 17, partners-2  Other Topics Concern  . Not on file  Social History Narrative  . Not on file   Social Determinants of Health   Financial Resource Strain: Not on file  Food Insecurity: Not on file  Transportation Needs: Not on file  Physical Activity: Not on file  Stress: Not on file  Social Connections: Not on file  Intimate Partner Violence: Not on file    Outpatient Medications Prior to Visit  Medication Sig Dispense Refill  . amitriptyline (ELAVIL) 100 MG tablet Take 100 mg by mouth at bedtime.    Marland Kitchen buPROPion (WELLBUTRIN SR) 150 MG 12 hr tablet Take 1 tablet (150 mg total) by mouth at bedtime as needed. 90 tablet 1  . butalbital-acetaminophen-caffeine (FIORICET) 50-325-40 MG tablet TAKE 1 TO 2 TABLETS BY MOUTH EVERY 6 HOURS AS NEEDED FOR HEADACHE 30  tablet 1  . cetirizine (ZYRTEC) 10 MG tablet Take 10 mg by mouth at bedtime.    . clonazePAM (KLONOPIN) 0.5 MG tablet Take 1 tablet (0.5 mg total) by mouth 2 (two) times daily as needed. for anxiety 60 tablet 2  . cyclobenzaprine (FLEXERIL) 5 MG tablet Take 1 tablet (5 mg total) by mouth 3 (three) times daily as needed for muscle spasms. 90 tablet 1  . Fremanezumab-vfrm (AJOVY) 225 MG/1.5ML SOAJ Inject 225 mg into the skin every 30 (thirty) days. 1.5 mL 2  . Lasmiditan Succinate (REYVOW) 100 MG TABS Take 1 tablet by mouth daily as needed. 8 tablet 3  . methylphenidate (RITALIN) 20 MG tablet Take 1 tablet (20 mg total) by mouth 3 (three) times daily with meals. 90 tablet 0  . promethazine (PHENERGAN) 12.5  MG tablet take 1 tablet every 6 hours as needed for nausea 30 tablet 3  . amitriptyline (ELAVIL) 50 MG tablet Take 1 tablet (50 mg total) by mouth at bedtime. 90 tablet 1  . ibuprofen (ADVIL) 600 MG tablet take 1 tablet po pc every 6 hours for 5 days then prn-post operative pain 30 tablet 1   No facility-administered medications prior to visit.    No Known Allergies  Review of Systems  Constitutional: Negative for chills, fatigue and fever.  HENT: Negative for congestion, ear pain, rhinorrhea and sore throat.   Respiratory: Negative for cough and shortness of breath.   Cardiovascular: Negative for chest pain.  Musculoskeletal: Positive for back pain. Negative for myalgias.       Objective:    Physical Exam Vitals reviewed.  Constitutional:      Appearance: Normal appearance.  Cardiovascular:     Rate and Rhythm: Normal rate and regular rhythm.     Heart sounds: Normal heart sounds.  Pulmonary:     Effort: Pulmonary effort is normal. No respiratory distress.     Breath sounds: Normal breath sounds.  Musculoskeletal:        General: Tenderness and deformity (left muscular tenderness.) present. Normal range of motion.     Comments: Positive fabere sign on left. Right fabere sign negative.    Neurological:     Mental Status: She is alert and oriented to person, place, and time.  Psychiatric:        Mood and Affect: Mood normal.        Behavior: Behavior normal.     BP 124/76   Pulse 94   Temp (!) 97.5 F (36.4 C)   Ht 5\' 1"  (1.549 m)   Wt 165 lb (74.8 kg)   SpO2 99%   BMI 31.18 kg/m  Wt Readings from Last 3 Encounters:  02/02/20 165 lb (74.8 kg)  01/14/20 165 lb 12.8 oz (75.2 kg)  06/27/18 162 lb 8 oz (73.7 kg)    Health Maintenance Due  Topic Date Due  . Hepatitis C Screening  Never done  . HIV Screening  Never done  . TETANUS/TDAP  Never done  . COLONOSCOPY (Pts 45-102yrs Insurance coverage will need to be confirmed)  Never done  . PAP SMEAR-Modifier   02/27/2017  . INFLUENZA VACCINE  08/24/2019    There are no preventive care reminders to display for this patient.   Lab Results  Component Value Date   TSH 1.725 02/27/2014   Lab Results  Component Value Date   WBC 11.6 (H) 06/28/2018   HGB 11.4 (L) 06/28/2018   HCT 35.5 (L) 06/28/2018   MCV 97.8 06/28/2018  PLT 240 06/28/2018   Lab Results  Component Value Date   NA 141 06/24/2018   K 4.0 06/24/2018   CO2 30 06/24/2018   GLUCOSE 106 (H) 06/24/2018   BUN 8 06/24/2018   CREATININE 0.57 06/24/2018   BILITOT 0.3 02/27/2014   ALKPHOS 75 02/27/2014   AST 18 02/27/2014   ALT 18 02/27/2014   PROT 6.9 02/27/2014   ALBUMIN 4.3 02/27/2014   CALCIUM 9.3 06/24/2018   ANIONGAP 7 06/24/2018   Lab Results  Component Value Date   CHOL 131 02/27/2014   Lab Results  Component Value Date   HDL 71 05/09/2012   Lab Results  Component Value Date   LDLCALC 52 05/09/2012   Lab Results  Component Value Date   TRIG 50 05/09/2012   Lab Results  Component Value Date   CHOLHDL 1.9 05/09/2012   No results found for: HGBA1C     Assessment & Plan:  1. Hip pain, bilateral 2. Lumbar pain Discontinue ibuprofen. Start on meloxicam 15 mg once daily.  - Ambulatory referral to Orthopedic Surgery 3. Migraines: Continue Ajovy.     Orders Placed This Encounter  Procedures  . Ambulatory referral to Orthopedic Surgery     Follow-up: Return if symptoms worsen or fail to improve.  An After Visit Summary was printed and given to the patient.  Blane Ohara, MD [facility] [phone]

## 2020-02-02 NOTE — Patient Instructions (Signed)
Discontinue ibuprofen. Start meloxicam 15 mg once daily.  Refer to Dr. Yevette Edwards with [facility].

## 2020-02-03 ENCOUNTER — Ambulatory Visit: Payer: 59 | Admitting: Family Medicine

## 2020-02-28 ENCOUNTER — Other Ambulatory Visit: Payer: Self-pay | Admitting: Family Medicine

## 2020-03-02 ENCOUNTER — Other Ambulatory Visit: Payer: Self-pay

## 2020-03-02 DIAGNOSIS — F988 Other specified behavioral and emotional disorders with onset usually occurring in childhood and adolescence: Secondary | ICD-10-CM

## 2020-03-02 MED ORDER — METHYLPHENIDATE HCL 20 MG PO TABS: 20.0000 mg | ORAL_TABLET | Freq: Three times a day (TID) | ORAL | 0 refills | Status: DC

## 2020-03-02 NOTE — Telephone Encounter (Signed)
Pt called requesting refill on medication.

## 2020-03-08 ENCOUNTER — Other Ambulatory Visit: Payer: Self-pay | Admitting: Family Medicine

## 2020-03-08 NOTE — Telephone Encounter (Signed)
Pt calling requesting refill on medication.  

## 2020-03-15 ENCOUNTER — Other Ambulatory Visit: Payer: Self-pay | Admitting: Orthopedic Surgery

## 2020-03-15 DIAGNOSIS — M545 Low back pain, unspecified: Secondary | ICD-10-CM

## 2020-03-30 ENCOUNTER — Other Ambulatory Visit: Payer: Self-pay

## 2020-03-30 DIAGNOSIS — M25551 Pain in right hip: Secondary | ICD-10-CM

## 2020-03-30 DIAGNOSIS — M545 Low back pain, unspecified: Secondary | ICD-10-CM

## 2020-03-30 DIAGNOSIS — M25552 Pain in left hip: Secondary | ICD-10-CM

## 2020-03-30 MED ORDER — MELOXICAM 15 MG PO TABS: 15.0000 mg | ORAL_TABLET | Freq: Every day | ORAL | 0 refills | Status: DC

## 2020-04-08 ENCOUNTER — Other Ambulatory Visit: Payer: Self-pay | Admitting: Family Medicine

## 2020-04-08 DIAGNOSIS — F330 Major depressive disorder, recurrent, mild: Secondary | ICD-10-CM

## 2020-04-08 DIAGNOSIS — F988 Other specified behavioral and emotional disorders with onset usually occurring in childhood and adolescence: Secondary | ICD-10-CM

## 2020-04-08 MED ORDER — METHYLPHENIDATE HCL 20 MG PO TABS: 20.0000 mg | ORAL_TABLET | Freq: Three times a day (TID) | ORAL | 0 refills | Status: DC

## 2020-04-19 ENCOUNTER — Telehealth: Payer: Self-pay

## 2020-04-19 NOTE — Telephone Encounter (Signed)
PA submitted and denied via covermymeds. Per insurance: patient will need to see 1 ot the 3 specialist listed below.  "The request for coverage for Reyvow Tab 100mg , use as directed (8 per month), is denied. This decision is based on health plan criteria for Reyvow. This medicine is covered only if: All of the following: (1) You have a history of a therapeutic failure (after at least 3 migraine episodes and a minimum of a 30-day trial), contraindication or intolerance to two of the following (document name and date tried): (A) Almotriptan (Axert). (B) Eletriptan (Relpax). (C) Frovatriptan (Frova). (D) Naratriptan (Amerge). (E) Rizatriptan (Maxalt/Maxalt MLT). (F) Sumatriptan (Imitrex). (G) Zolmitriptan (Zomig). (2) The drug is prescribed by or in consultation with one of the following specialists with expertise in the acute treatment of migraine: (A) Neurologist. (B) Pain Specialist. (C) Headache Specialist."

## 2020-04-24 ENCOUNTER — Other Ambulatory Visit: Payer: Self-pay | Admitting: Family Medicine

## 2020-04-24 DIAGNOSIS — G43009 Migraine without aura, not intractable, without status migrainosus: Secondary | ICD-10-CM

## 2020-04-24 DIAGNOSIS — F330 Major depressive disorder, recurrent, mild: Secondary | ICD-10-CM

## 2020-04-29 NOTE — Telephone Encounter (Signed)
Believe this patient has had failures to several prior migraine medications - please check history and resubmit to covermymeds

## 2020-05-04 NOTE — Telephone Encounter (Signed)
Patient informed of denial.  She does not want [place] to proceed with a referral at this time because of her having to miss work.  She will call us back about the referral.

## 2020-05-17 ENCOUNTER — Other Ambulatory Visit: Payer: Self-pay

## 2020-05-17 DIAGNOSIS — F988 Other specified behavioral and emotional disorders with onset usually occurring in childhood and adolescence: Secondary | ICD-10-CM

## 2020-05-18 ENCOUNTER — Other Ambulatory Visit: Payer: Self-pay

## 2020-05-18 ENCOUNTER — Other Ambulatory Visit: Payer: Self-pay | Admitting: Family Medicine

## 2020-05-18 DIAGNOSIS — F988 Other specified behavioral and emotional disorders with onset usually occurring in childhood and adolescence: Secondary | ICD-10-CM

## 2020-05-18 MED ORDER — METHYLPHENIDATE HCL 20 MG PO TABS: 20.0000 mg | ORAL_TABLET | Freq: Three times a day (TID) | ORAL | 0 refills | Status: DC

## 2020-06-21 ENCOUNTER — Other Ambulatory Visit: Payer: Self-pay | Admitting: Family Medicine

## 2020-06-21 DIAGNOSIS — G43009 Migraine without aura, not intractable, without status migrainosus: Secondary | ICD-10-CM

## 2020-06-21 NOTE — Progress Notes (Signed)
Acute Office Visit  Subjective:    Patient ID: Anne Brown, female    DOB: 1970/10/24, 50 y.o.   MRN: [id number]  Chief Complaint  Patient presents with  . fingernail infection    HPI Patient is in today for nail infection for about 3 months.  Bruising over the last 2 months with minimal bumping. She does not even remember bumping anything.  ADD has been stable on ritalin 20 mg one three times a day.  Migraines: headaches pretty stable. On amitriptyline 100 mg once daily at night. On fioricet prn.   Past Medical History:  Diagnosis Date  . Abnormal Pap smear   . ADHD   . Anxiety   . Family history of adverse reaction to anesthesia    mother n/v severe  . Headache    migraines  . Lower back pain   . LSIL (low grade squamous intraepithelial lesion) on Pap smear 10/15/2007    Past Surgical History:  Procedure Laterality Date  . ANTERIOR AND POSTERIOR REPAIR N/A 06/27/2018   Procedure: ANTERIOR (CYSTOCELE) AND POSTERIOR REPAIR (RECTOCELE);  Surgeon: Osborn Coho, MD;  Location: [facility];  Service: Gynecology;  Laterality: N/A;  . DILATION AND CURETTAGE OF UTERUS  1992  . DILATION AND CURETTAGE OF UTERUS  2009  . ECTOPIC PREGNANCY SURGERY  01/2008   After BTL  . HYSTEROSCOPY WITH NOVASURE N/A 06/27/2018   Procedure: HYSTEROSCOPY WITH NOVASURE;  Surgeon: Osborn Coho, MD;  Location: [facility];  Service: Gynecology;  Laterality: N/A;  . TUBAL LIGATION      Family History  Problem Relation Age of Onset  . Depression Mother     Social History   Socioeconomic History  . Marital status: Married    Spouse name: Not on file  . Number of children: Not on file  . Years of education: Not on file  . Highest education level: Not on file  Occupational History  . Not on file  Tobacco Use  . Smoking status: Never Smoker  . Smokeless tobacco: Never Used  Vaping Use  . Vaping Use: Never used  Substance and Sexual Activity  . Alcohol use:  Yes    Alcohol/week: 0.0 standard drinks    Comment: 3 glasses a week   . Drug use: No  . Sexual activity: Yes    Birth control/protection: None, Surgical    Comment: 1st intercourse- 17, partners-2  Other Topics Concern  . Not on file  Social History Narrative  . Not on file   Social Determinants of Health   Financial Resource Strain: Not on file  Food Insecurity: Not on file  Transportation Needs: Not on file  Physical Activity: Not on file  Stress: Not on file  Social Connections: Not on file  Intimate Partner Violence: Not on file    Outpatient Medications Prior to Visit  Medication Sig Dispense Refill  . amitriptyline (ELAVIL) 100 MG tablet TAKE 1 TABLET BY MOUTH ONCE DAILY AT NIGHT 90 tablet 1  . buPROPion (WELLBUTRIN SR) 150 MG 12 hr tablet TAKE 1 TABLET BY MOUTH AT BEDTIME AS NEEDED 90 tablet 0  . cetirizine (ZYRTEC) 10 MG tablet Take 10 mg by mouth at bedtime.    . clonazePAM (KLONOPIN) 0.5 MG tablet Take 1 tablet by mouth twice daily as needed for anxiety 60 tablet 3  . Fremanezumab-vfrm (AJOVY) 225 MG/1.5ML SOAJ Inject 225 mg into the skin every 30 (thirty) days. 1.5 mL 2  . Lasmiditan Succinate (REYVOW) 100  MG TABS Take 1 tablet by mouth daily as needed. 8 tablet 3  . meclizine (ANTIVERT) 25 MG tablet TAKE 1 TABLET BY MOUTH EVERY 6 HOURS AS NEEDED FOR DIZZINESS 30 tablet 0  . meloxicam (MOBIC) 15 MG tablet Take 1 tablet (15 mg total) by mouth daily. 90 tablet 0  . promethazine (PHENERGAN) 12.5 MG tablet take 1 tablet every 6 hours as needed for nausea 30 tablet 3  . butalbital-acetaminophen-caffeine (FIORICET) 50-325-40 MG tablet TAKE 1 TO 2 TABLETS BY MOUTH EVERY 6 HOURS AS NEEDED FOR HEADACHE 30 tablet 0  . cyclobenzaprine (FLEXERIL) 5 MG tablet Take 1 tablet by mouth three times daily as needed for muscle spasm 90 tablet 0  . methylphenidate (RITALIN) 20 MG tablet Take 1 tablet (20 mg total) by mouth 3 (three) times daily with meals. 90 tablet 0   No  facility-administered medications prior to visit.    No Known Allergies  Review of Systems  Constitutional: Negative for chills, fatigue and fever.  HENT: Negative for congestion, ear pain and sore throat.   Respiratory: Negative for cough and shortness of breath.   Cardiovascular: Negative for chest pain and palpitations.  Gastrointestinal: Negative for abdominal pain, constipation, diarrhea, nausea and vomiting.  Endocrine: Positive for polydipsia. Negative for polyphagia and polyuria.  Genitourinary: Negative for difficulty urinating and dysuria.  Musculoskeletal: Positive for arthralgias and back pain. Negative for myalgias.  Skin: Negative for rash.       Changes in fingernails  Neurological: Negative for headaches.  Psychiatric/Behavioral: Negative for dysphoric mood. The patient is not nervous/anxious.        Objective:    Physical Exam Vitals reviewed.  Constitutional:      Appearance: Normal appearance.  Neck:     Vascular: No carotid bruit.  Cardiovascular:     Rate and Rhythm: Normal rate and regular rhythm.     Pulses: Normal pulses.     Heart sounds: Normal heart sounds.  Pulmonary:     Effort: Pulmonary effort is normal.     Breath sounds: Normal breath sounds.  Abdominal:     General: Bowel sounds are normal.     Palpations: Abdomen is soft.     Tenderness: There is no abdominal tenderness.  Skin:    Comments: Thickened finger nails. Discoloration.  Neurological:     Mental Status: She is alert and oriented to person, place, and time.  Psychiatric:        Mood and Affect: Mood normal.        Behavior: Behavior normal.     BP 120/84   Pulse 96   Temp (!) 97.2 F (36.2 C)   Resp 16   Ht 5\' 1"  (1.549 m)   Wt 162 lb (73.5 kg)   BMI 30.61 kg/m  Wt Readings from Last 3 Encounters:  06/22/20 162 lb (73.5 kg)  02/02/20 165 lb (74.8 kg)  01/14/20 165 lb 12.8 oz (75.2 kg)    Health Maintenance Due  Topic Date Due  . HIV Screening  Never done  .  Hepatitis C Screening  Never done  . TETANUS/TDAP  Never done  . COLONOSCOPY (Pts 45-70yrs Insurance coverage will need to be confirmed)  Never done  . PAP SMEAR-Modifier  02/27/2017    There are no preventive care reminders to display for this patient.   Lab Results  Component Value Date   TSH 1.725 02/27/2014   Lab Results  Component Value Date   WBC 6.0 06/22/2020  HGB 13.6 06/22/2020   HCT 41.4 06/22/2020   MCV 96 06/22/2020   PLT 323 06/22/2020   Lab Results  Component Value Date   NA 141 06/22/2020   K 4.2 06/22/2020   CO2 25 06/22/2020   GLUCOSE 96 06/22/2020   BUN 8 06/22/2020   CREATININE 0.61 06/22/2020   BILITOT 0.3 06/22/2020   ALKPHOS 102 06/22/2020   AST 24 06/22/2020   ALT 24 06/22/2020   PROT 7.0 06/22/2020   ALBUMIN 4.5 06/22/2020   CALCIUM 9.5 06/22/2020   ANIONGAP 7 06/24/2018   EGFR 110 06/22/2020   Lab Results  Component Value Date   CHOL 131 02/27/2014   Lab Results  Component Value Date   HDL 71 05/09/2012   Lab Results  Component Value Date   LDLCALC 52 05/09/2012   Lab Results  Component Value Date   TRIG 50 05/09/2012   Lab Results  Component Value Date   CHOLHDL 1.9 05/09/2012   No results found for: HGBA1C     Assessment & Plan:  1. Bruising - CBC with Differential/Platelet - PT AND PTT  2. Infection of nail bed of finger, right - Comprehensive metabolic panel Start on lamisil.   3. Attention deficit disorder (ADD) without hyperactivity The current medical regimen is effective;  continue present plan and medications. - methylphenidate (RITALIN) 20 MG tablet; Take 1 tablet (20 mg total) by mouth 3 (three) times daily with meals.  Dispense: 90 tablet; Refill: 0  4. Migraine without aura and without status migrainosus, not intractable  The current medical regimen is effective;  continue present plan and medications.   Meds ordered this encounter  Medications  . terbinafine (LAMISIL) 250 MG tablet    Sig: Take  1 tablet (250 mg total) by mouth daily.    Dispense:  90 tablet    Refill:  0  . methylphenidate (RITALIN) 20 MG tablet    Sig: Take 1 tablet (20 mg total) by mouth 3 (three) times daily with meals.    Dispense:  90 tablet    Refill:  0    Orders Placed This Encounter  Procedures  . CBC with Differential/Platelet  . PT AND PTT  . Comprehensive metabolic panel   I,Marla I Leal-Borjas,acting as a scribe for Rochel Brome, MD.,have documented all relevant documentation on the behalf of Rochel Brome, MD,as directed by  Rochel Brome, MD while in the presence of Rochel Brome, MD.   Follow-up: No follow-ups on file.  An After Visit Summary was printed and given to the patient.  Rochel Brome, MD [facility] [phone]

## 2020-06-22 ENCOUNTER — Ambulatory Visit (INDEPENDENT_AMBULATORY_CARE_PROVIDER_SITE_OTHER): Payer: 59 | Admitting: Family Medicine

## 2020-06-22 ENCOUNTER — Other Ambulatory Visit: Payer: Self-pay

## 2020-06-22 ENCOUNTER — Ambulatory Visit: Payer: 59 | Admitting: Family Medicine

## 2020-06-22 VITALS — BP 120/84 | HR 96 | Temp 97.2°F | Resp 16 | Ht 61.0 in | Wt 162.0 lb

## 2020-06-22 DIAGNOSIS — G43009 Migraine without aura, not intractable, without status migrainosus: Secondary | ICD-10-CM | POA: Diagnosis not present

## 2020-06-22 DIAGNOSIS — T148XXA Other injury of unspecified body region, initial encounter: Secondary | ICD-10-CM

## 2020-06-22 DIAGNOSIS — L03011 Cellulitis of right finger: Secondary | ICD-10-CM | POA: Diagnosis not present

## 2020-06-22 DIAGNOSIS — F988 Other specified behavioral and emotional disorders with onset usually occurring in childhood and adolescence: Secondary | ICD-10-CM | POA: Diagnosis not present

## 2020-06-22 MED ORDER — TERBINAFINE HCL 250 MG PO TABS: 250.0000 mg | ORAL_TABLET | Freq: Every day | ORAL | 0 refills | Status: DC

## 2020-06-22 MED ORDER — METHYLPHENIDATE HCL 20 MG PO TABS: 20.0000 mg | ORAL_TABLET | Freq: Three times a day (TID) | ORAL | 0 refills | Status: DC

## 2020-06-23 LAB — COMPREHENSIVE METABOLIC PANEL
ALT: 24 IU/L (ref 0–32)
AST: 24 IU/L (ref 0–40)
Albumin/Globulin Ratio: 1.8 (ref 1.2–2.2)
Albumin: 4.5 g/dL (ref 3.8–4.8)
Alkaline Phosphatase: 102 IU/L (ref 44–121)
BUN/Creatinine Ratio: 13 (ref 9–23)
BUN: 8 mg/dL (ref 6–24)
Bilirubin Total: 0.3 mg/dL (ref 0.0–1.2)
CO2: 25 mmol/L (ref 20–29)
Calcium: 9.5 mg/dL (ref 8.7–10.2)
Chloride: 101 mmol/L (ref 96–106)
Creatinine, Ser: 0.61 mg/dL (ref 0.57–1.00)
Globulin, Total: 2.5 g/dL (ref 1.5–4.5)
Glucose: 96 mg/dL (ref 65–99)
Potassium: 4.2 mmol/L (ref 3.5–5.2)
Sodium: 141 mmol/L (ref 134–144)
Total Protein: 7 g/dL (ref 6.0–8.5)
eGFR: 110 mL/min/{1.73_m2} (ref >59)

## 2020-06-23 LAB — CBC WITH DIFFERENTIAL/PLATELET
Basophils Absolute: 0.1 10*3/uL (ref 0.0–0.2)
Basos: 1 %
EOS (ABSOLUTE): 0.1 10*3/uL (ref 0.0–0.4)
Eos: 1 %
Hematocrit: 41.4 % (ref 34.0–46.6)
Hemoglobin: 13.6 g/dL (ref 11.1–15.9)
Immature Grans (Abs): 0 10*3/uL (ref 0.0–0.1)
Immature Granulocytes: 0 %
Lymphocytes Absolute: 2 10*3/uL (ref 0.7–3.1)
Lymphs: 33 %
MCH: 31.4 pg (ref 26.6–33.0)
MCHC: 32.9 g/dL (ref 31.5–35.7)
MCV: 96 fL (ref 79–97)
Monocytes Absolute: 0.4 10*3/uL (ref 0.1–0.9)
Monocytes: 6 %
Neutrophils Absolute: 3.5 10*3/uL (ref 1.4–7.0)
Neutrophils: 59 %
Platelets: 323 10*3/uL (ref 150–450)
RBC: 4.33 x10E6/uL (ref 3.77–5.28)
RDW: 12.2 % (ref 11.7–15.4)
WBC: 6 10*3/uL (ref 3.4–10.8)

## 2020-06-23 LAB — PT AND PTT
INR: 0.9 (ref 0.9–1.2)
Prothrombin Time: 9.9 s (ref 9.1–12.0)
aPTT: 26 s (ref 24–33)

## 2020-06-29 ENCOUNTER — Encounter: Payer: Self-pay | Admitting: Family Medicine

## 2020-07-13 ENCOUNTER — Other Ambulatory Visit: Payer: Self-pay | Admitting: Orthopedic Surgery

## 2020-07-13 DIAGNOSIS — M545 Low back pain, unspecified: Secondary | ICD-10-CM

## 2020-07-23 ENCOUNTER — Other Ambulatory Visit: Payer: Self-pay

## 2020-07-23 ENCOUNTER — Other Ambulatory Visit: Payer: Self-pay | Admitting: Internal Medicine

## 2020-07-23 ENCOUNTER — Ambulatory Visit: Payer: 59 | Admitting: Nurse Practitioner

## 2020-07-23 ENCOUNTER — Encounter: Payer: Self-pay | Admitting: Nurse Practitioner

## 2020-07-23 ENCOUNTER — Ambulatory Visit
Admission: RE | Admit: 2020-07-23 | Discharge: 2020-07-23 | Disposition: A | Discharge status: Home / Self Care | Payer: 59 | Source: Ambulatory Visit | Attending: Orthopedic Surgery | Admitting: Orthopedic Surgery

## 2020-07-23 ENCOUNTER — Inpatient Hospital Stay: Admission: RE | Admit: 2020-07-23 | Payer: 59 | Source: Ambulatory Visit

## 2020-07-23 VITALS — BP 128/72 | HR 78 | Temp 97.9°F | Ht 61.0 in | Wt 160.0 lb

## 2020-07-23 DIAGNOSIS — M79641 Pain in right hand: Secondary | ICD-10-CM

## 2020-07-23 DIAGNOSIS — R2232 Localized swelling, mass and lump, left upper limb: Secondary | ICD-10-CM

## 2020-07-23 DIAGNOSIS — M2559 Pain in other specified joint: Secondary | ICD-10-CM

## 2020-07-23 DIAGNOSIS — M79642 Pain in left hand: Secondary | ICD-10-CM

## 2020-07-23 DIAGNOSIS — M545 Low back pain, unspecified: Secondary | ICD-10-CM

## 2020-07-23 DIAGNOSIS — R2231 Localized swelling, mass and lump, right upper limb: Secondary | ICD-10-CM

## 2020-07-23 DIAGNOSIS — R202 Paresthesia of skin: Secondary | ICD-10-CM

## 2020-07-23 DIAGNOSIS — R200 Anesthesia of skin: Secondary | ICD-10-CM

## 2020-07-23 MED ORDER — DICLOFENAC SODIUM 1 % EX GEL: 2.0000 g | Freq: Four times a day (QID) | CUTANEOUS | 0 refills | Status: DC

## 2020-07-23 NOTE — Patient Instructions (Signed)
Labs drawn today to rule out rheumatoid arthritis We will call you with lab results Continue Ibuprofen as directed Use Voltaren gel topically to bilateral hands 4 times daily Follow-up as needed  Hand Pain Many things can cause hand pain. Some common causes are: An injury. Repeating the same movement with your hand over and over (overuse). Osteoporosis. Arthritis. Lumps in the tendons or joints of the hand and wrist (ganglion cysts). Nerve compression syndromes (carpal tunnel syndrome). Inflammation of the tendons (tendinitis). Infection. Follow these instructions at home: Pay attention to any changes in your symptoms. Take these actions to help withyour discomfort: Managing pain, stiffness, and swelling  Take over-the-counter and prescription medicines only as told by your health care provider. Wear a hand splint or support as told by your health care provider. If directed, put ice on the affected area: Put ice in a plastic bag. Place a towel between your skin and the bag. Leave the ice on for 20 minutes, 2-3 times a day.  Activity Take breaks from repetitive activity often. Avoid activities that make your pain worse. Minimize stress on your hands and wrists as much as possible. Do stretches or exercises as told by your health care provider. Do not do activities that make your pain worse. Contact a health care provider if: Your pain does not get better after a few days of self-care. Your pain gets worse. Your pain affects your ability to do your daily activities. Get help right away if: Your hand becomes warm, red, or swollen. Your hand is numb or tingling. Your hand is extremely swollen or deformed. Your hand or fingers turn white or blue. You cannot move your hand, wrist, or fingers. Summary Many things can cause hand pain. Contact your health care provider if your pain does not get better after a few days of self care. Minimize stress on your hands and wrists as much  as possible. Do not do activities that make your pain worse. This information is not intended to replace advice given to you by your health care provider. Make sure you discuss any questions you have with your healthcare provider. Document Revised: 10/05/2017 Document Reviewed: 10/05/2017 Elsevier Patient Education  2022 [employer] Osteoarthritis  Osteoarthritis is a type of arthritis. It refers to joint pain or joint disease. Osteoarthritis affects tissue that covers the ends of bones in joints (cartilage). Cartilage acts as a cushion between the bones and helps them move smoothly. Osteoarthritis occurs when cartilage in the joints gets worn down.Osteoarthritis is sometimes called "wear and tear" arthritis. Osteoarthritis is the most common form of arthritis. It often occurs in older people. It is a condition that gets worse over time. The joints most often affected by this condition are in the fingers, toes, hips, knees, and spine,including the neck and lower back. What are the causes? This condition is caused by the wearing down of cartilage that covers the endsof bones. What increases the risk? The following factors may make you more likely to develop this condition: Being age 56 or older. Obesity. Overuse of joints. Past injury of a joint. Past surgery on a joint. Family history of osteoarthritis. What are the signs or symptoms? The main symptoms of this condition are pain, swelling, and stiffness in the joint. Other symptoms may include: An enlarged joint. More pain and further damage caused by small pieces of bone or cartilage that break off and float inside of the joint. Small deposits of bone (osteophytes) that grow on the edges of the  joint. A grating or scraping feeling inside the joint when you move it. Popping or creaking sounds when you move. Difficulty walking or exercising. An inability to grip items, twist your hand(s), or control the movements of your hands and  fingers. How is this diagnosed? This condition may be diagnosed based on: Your medical history. A physical exam. Your symptoms. X-rays of the affected joint(s). Blood tests to rule out other types of arthritis. How is this treated? There is no cure for this condition, but treatment can help control pain and improve joint function. Treatment may include a combination of therapies, such as: Pain relief techniques, such as: Applying heat and cold to the joint. Massage. A form of talk therapy called cognitive behavioral therapy (CBT). This therapy helps you set goals and follow up on the changes that you make. Medicines for pain and inflammation. The medicines can be taken by mouth or applied to the skin. They include: NSAIDs, such as ibuprofen. Prescription medicines. Strong anti-inflammatory medicines (corticosteroids). Certain nutritional supplements. A prescribed exercise program. You may work with a physical therapist. Assistive devices, such as a brace, wrap, splint, specialized glove, or cane. A weight control plan. Surgery, such as: An osteotomy. This is done to reposition the bones and relieve pain or to remove loose pieces of bone and cartilage. Joint replacement surgery. You may need this surgery if you have advanced osteoarthritis. Follow these instructions at home: Activity Rest your affected joints as told by your health care provider. Exercise as told by your health care provider. He or she may recommend specific types of exercise, such as: Strengthening exercises. These are done to strengthen the muscles that support joints affected by arthritis. Aerobic activities. These are exercises, such as brisk walking or water aerobics, that increase your heart rate. Range-of-motion activities. These help your joints move more easily. Balance and agility exercises. Managing pain, stiffness, and swelling     If directed, apply heat to the affected area as often as told by your  health care provider. Use the heat source that your health care provider recommends, such as a moist heat pack or a heating pad. If you have a removable assistive device, remove it as told by your health care provider. Place a towel between your skin and the heat source. If your health care provider tells you to keep the assistive device on while you apply heat, place a towel between the assistive device and the heat source. Leave the heat on for 20-30 minutes. Remove the heat if your skin turns bright red. This is especially important if you are unable to feel pain, heat, or cold. You may have a greater risk of getting burned. If directed, put ice on the affected area. To do this: If you have a removable assistive device, remove it as told by your health care provider. Put ice in a plastic bag. Place a towel between your skin and the bag. If your health care provider tells you to keep the assistive device on during icing, place a towel between the assistive device and the bag. Leave the ice on for 20 minutes, 2-3 times a day. Move your fingers or toes often to reduce stiffness and swelling. Raise (elevate) the injured area above the level of your heart while you are sitting or lying down. General instructions Take over-the-counter and prescription medicines only as told by your health care provider. Maintain a healthy weight. Follow instructions from your health care provider for weight control. Do not use any  products that contain nicotine or tobacco, such as cigarettes, e-cigarettes, and chewing tobacco. If you need help quitting, ask your health care provider. Use assistive devices as told by your health care provider. Keep all follow-up visits as told by your health care provider. This is important. Where to find more information [employer] of Arthritis and Musculoskeletal and Skin Diseases: www.niams.http://www.myers.net/ [employer] on Aging: https://walker.com/ American College of  Rheumatology: www.rheumatology.org Contact a health care provider if: You have redness, swelling, or a feeling of warmth in a joint that gets worse. You have a fever along with joint or muscle aches. You develop a rash. You have trouble doing your normal activities. Get help right away if: You have pain that gets worse and is not relieved by pain medicine. Summary Osteoarthritis is a type of arthritis that affects tissue covering the ends of bones in joints (cartilage). This condition is caused by the wearing down of cartilage that covers the ends of bones. The main symptom of this condition is pain, swelling, and stiffness in the joint. There is no cure for this condition, but treatment can help control pain and improve joint function. This information is not intended to replace advice given to you by your health care provider. Make sure you discuss any questions you have with your healthcare provider. Document Revised: 01/06/2019 Document Reviewed: 01/06/2019 Elsevier Patient Education  2022 [employer].

## 2020-07-23 NOTE — Addendum Note (Signed)
Addended by: Janie Morning on: 07/23/2020 09:56 AM   Modules accepted: Orders

## 2020-07-23 NOTE — Progress Notes (Signed)
Acute Office Visit  Subjective:    Patient ID: Anne Brown, female    DOB: 1970-11-09, 50 y.o.   MRN: [id number]  Chief Complaint  Patient presents with   Joint pain    HPI Patient is in today for joint pain. She states she has had increased bilateral hand pain, joint swelling, and stiffness. Symptoms are worse in the early morning, right hand more painful than left. Onset was several months ago. She treats pain with Ibuprofen and Mobic 15 mg daily. She tells me that her mother and grandmother have bilateral hand arthritis with disfigured fingers. She is unsure if they have rheumatoid arthritis. She is scheduled to undergo a MRI of her back today due to chronic back pain.    Past Medical History:  Diagnosis Date   Abnormal Pap smear    ADHD    Anxiety    Family history of adverse reaction to anesthesia    mother n/v severe   Headache    migraines   Lower back pain    LSIL (low grade squamous intraepithelial lesion) on Pap smear 10/15/2007    Past Surgical History:  Procedure Laterality Date   ANTERIOR AND POSTERIOR REPAIR N/A 06/27/2018   Procedure: ANTERIOR (CYSTOCELE) AND POSTERIOR REPAIR (RECTOCELE);  Surgeon: Everett Graff, MD;  Location: [facility];  Service: Gynecology;  Laterality: N/A;   DILATION AND CURETTAGE OF UTERUS  1992   DILATION AND CURETTAGE OF UTERUS  2009   ECTOPIC PREGNANCY SURGERY  01/2008   After BTL   HYSTEROSCOPY WITH NOVASURE N/A 06/27/2018   Procedure: HYSTEROSCOPY WITH NOVASURE;  Surgeon: Everett Graff, MD;  Location: [facility];  Service: Gynecology;  Laterality: N/A;   TUBAL LIGATION      Family History  Problem Relation Age of Onset   Depression Mother     Social History   Socioeconomic History   Marital status: Married    Spouse name: Not on file   Number of children: Not on file   Years of education: Not on file   Highest education level: Not on file  Occupational History   Not on file  Tobacco Use    Smoking status: Never   Smokeless tobacco: Never  Vaping Use   Vaping Use: Never used  Substance and Sexual Activity   Alcohol use: Yes    Alcohol/week: 0.0 standard drinks    Comment: 3 glasses a week    Drug use: No   Sexual activity: Yes    Birth control/protection: None, Surgical    Comment: 1st intercourse- 17, partners-2  Other Topics Concern   Not on file  Social History Narrative   Not on file   Social Determinants of Health   Financial Resource Strain: Not on file  Food Insecurity: Not on file  Transportation Needs: Not on file  Physical Activity: Not on file  Stress: Not on file  Social Connections: Not on file  Intimate Partner Violence: Not on file    Outpatient Medications Prior to Visit  Medication Sig Dispense Refill   amitriptyline (ELAVIL) 100 MG tablet TAKE 1 TABLET BY MOUTH ONCE DAILY AT NIGHT 90 tablet 1   buPROPion (WELLBUTRIN SR) 150 MG 12 hr tablet TAKE 1 TABLET BY MOUTH AT BEDTIME AS NEEDED 90 tablet 0   butalbital-acetaminophen-caffeine (FIORICET) 50-325-40 MG tablet TAKE 1 TO 2 TABLETS BY MOUTH EVERY 6 HOURS AS NEEDED FOR HEADACHE 30 tablet 2   cetirizine (ZYRTEC) 10 MG tablet Take 10 mg by  mouth at bedtime.     clonazePAM (KLONOPIN) 0.5 MG tablet Take 1 tablet by mouth twice daily as needed for anxiety 60 tablet 3   cyclobenzaprine (FLEXERIL) 5 MG tablet Take 1 tablet by mouth three times daily as needed for muscle spasm 90 tablet 2   Lasmiditan Succinate (REYVOW) 100 MG TABS Take 1 tablet by mouth daily as needed. 8 tablet 3   meclizine (ANTIVERT) 25 MG tablet TAKE 1 TABLET BY MOUTH EVERY 6 HOURS AS NEEDED FOR DIZZINESS 30 tablet 0   meloxicam (MOBIC) 15 MG tablet Take 1 tablet (15 mg total) by mouth daily. 90 tablet 0   methylphenidate (RITALIN) 20 MG tablet Take 1 tablet (20 mg total) by mouth 3 (three) times daily with meals. 90 tablet 0   promethazine (PHENERGAN) 12.5 MG tablet take 1 tablet every 6 hours as needed for nausea 30 tablet 3    terbinafine (LAMISIL) 250 MG tablet Take 1 tablet (250 mg total) by mouth daily. 90 tablet 0   Fremanezumab-vfrm (AJOVY) 225 MG/1.5ML SOAJ Inject 225 mg into the skin every 30 (thirty) days. 1.5 mL 2   No facility-administered medications prior to visit.    No Known Allergies  Review of Systems  Musculoskeletal:  Positive for arthralgias, back pain, joint swelling, myalgias (Pain in hands/fingers), neck pain and neck stiffness.  All other systems reviewed and are negative.     Objective:    Physical Exam Vitals reviewed.  Constitutional:      Appearance: Normal appearance.  Pulmonary:     Effort: Pulmonary effort is normal.  Musculoskeletal:        General: Swelling and tenderness present.     Comments: Bilateral hand tenderness and joint swelling of finger joints  Skin:    General: Skin is warm and dry.     Capillary Refill: Capillary refill takes less than 2 seconds.  Neurological:     General: No focal deficit present.     Mental Status: She is alert and oriented to person, place, and time.  Psychiatric:        Mood and Affect: Mood normal.        Behavior: Behavior normal.    BP 128/72 (BP Location: Left Arm, Patient Position: Sitting)   Pulse 78   Temp 97.9 F (36.6 C) (Temporal)   Ht $R'5\' 1"'Vj$  (1.549 m)   Wt 160 lb (72.6 kg)   SpO2 98%   BMI 30.23 kg/m  Wt Readings from Last 3 Encounters:  07/23/20 160 lb (72.6 kg)  06/22/20 162 lb (73.5 kg)  02/02/20 165 lb (74.8 kg)    Health Maintenance Due  Topic Date Due   HIV Screening  Never done   Hepatitis C Screening  Never done   TETANUS/TDAP  Never done   COLONOSCOPY (Pts 45-16yrs Insurance coverage will need to be confirmed)  Never done   PAP SMEAR-Modifier  02/27/2017    There are no preventive care reminders to display for this patient.   Lab Results  Component Value Date   TSH 1.725 02/27/2014   Lab Results  Component Value Date   WBC 6.0 06/22/2020   HGB 13.6 06/22/2020   HCT 41.4 06/22/2020    MCV 96 06/22/2020   PLT 323 06/22/2020   Lab Results  Component Value Date   NA 141 06/22/2020   K 4.2 06/22/2020   CO2 25 06/22/2020   GLUCOSE 96 06/22/2020   BUN 8 06/22/2020   CREATININE 0.61 06/22/2020   BILITOT  0.3 06/22/2020   ALKPHOS 102 06/22/2020   AST 24 06/22/2020   ALT 24 06/22/2020   PROT 7.0 06/22/2020   ALBUMIN 4.5 06/22/2020   CALCIUM 9.5 06/22/2020   ANIONGAP 7 06/24/2018   EGFR 110 06/22/2020   Lab Results  Component Value Date   CHOL 131 02/27/2014   Lab Results  Component Value Date   HDL 71 05/09/2012   Lab Results  Component Value Date   LDLCALC 52 05/09/2012   Lab Results  Component Value Date   TRIG 50 05/09/2012   Lab Results  Component Value Date   CHOLHDL 1.9 05/09/2012        Assessment & Plan:   1. Arthralgia, unspecified joint - Vitamin D, 25-hydroxy - C-reactive protein - ANA,IFA RA Diag Pnl w/rflx Tit/Patn - Cyclic citrul peptide antibody, IgG - B12 and Folate Panel - diclofenac Sodium (VOLTAREN) 1 % GEL; Apply 2 g topically 4 (four) times daily.  Dispense: 20 g; Refill: 0  2. Bilateral hand pain - diclofenac Sodium (VOLTAREN) 1 % GEL; Apply 2 g topically 4 (four) times daily.  Dispense: 20 g; Refill: 0  3. Localized swelling of finger of both hands - diclofenac Sodium (VOLTAREN) 1 % GEL; Apply 2 g topically 4 (four) times daily.  Dispense: 20 g; Refill: 0  4. Numbness and tingling in both hands - B12 and Folate Panel   Labs drawn today to rule out rheumatoid arthritis We will call you with lab results Continue Ibuprofen as directed Use Voltaren gel topically to bilateral hands 4 times daily Follow-up as needed  Follow-up: As needed   I, Lauren Peterson [place] as a scribe for [employer], NP.,have documented all relevant documentation on the behalf of Rip Harbour, NP,as directed by  Rip Harbour, NP while in the presence of Rip Harbour, NP.   I, Rip Harbour, NP, have reviewed all  documentation for this visit. The documentation on 07/23/20 for the exam, diagnosis, procedures, and orders are all accurate and complete.    Signed, Jerrell Belfast, DNP

## 2020-07-25 ENCOUNTER — Other Ambulatory Visit: Payer: Self-pay | Admitting: Family Medicine

## 2020-07-25 DIAGNOSIS — F988 Other specified behavioral and emotional disorders with onset usually occurring in childhood and adolescence: Secondary | ICD-10-CM

## 2020-07-30 ENCOUNTER — Other Ambulatory Visit: Payer: Self-pay

## 2020-07-30 ENCOUNTER — Encounter: Payer: Self-pay | Admitting: Nurse Practitioner

## 2020-07-30 DIAGNOSIS — F988 Other specified behavioral and emotional disorders with onset usually occurring in childhood and adolescence: Secondary | ICD-10-CM

## 2020-07-30 MED ORDER — METHYLPHENIDATE HCL 20 MG PO TABS: 20.0000 mg | ORAL_TABLET | Freq: Three times a day (TID) | ORAL | 0 refills | Status: DC

## 2020-08-02 ENCOUNTER — Other Ambulatory Visit: Payer: Self-pay | Admitting: Family Medicine

## 2020-08-02 DIAGNOSIS — F330 Major depressive disorder, recurrent, mild: Secondary | ICD-10-CM

## 2020-08-02 LAB — HM PAP SMEAR: HM Pap smear: NORMAL

## 2020-08-03 ENCOUNTER — Encounter: Payer: Self-pay | Admitting: Nurse Practitioner

## 2020-08-03 ENCOUNTER — Other Ambulatory Visit: Payer: Self-pay | Admitting: Nurse Practitioner

## 2020-08-03 DIAGNOSIS — E559 Vitamin D deficiency, unspecified: Secondary | ICD-10-CM

## 2020-08-03 MED ORDER — VITAMIN D (ERGOCALCIFEROL) 1.25 MG (50000 UNIT) PO CAPS
50000.0000 [IU] | ORAL_CAPSULE | ORAL | 2 refills | Status: DC
Start: 2020-08-03 — End: 2021-03-17

## 2020-08-04 LAB — B12 AND FOLATE PANEL
Folate: 16 ng/mL (ref >3.0)
Vitamin B-12: 281 pg/mL (ref 232–1245)

## 2020-08-04 LAB — C-REACTIVE PROTEIN: CRP: 2 mg/L (ref 0–10)

## 2020-08-04 LAB — VITAMIN D 25 HYDROXY (VIT D DEFICIENCY, FRACTURES): Vit D, 25-Hydroxy: 22.9 ng/mL — ABNORMAL LOW (ref 30.0–100.0)

## 2020-08-04 LAB — ANA,IFA RA DIAG PNL W/RFLX TIT/PATN
ANA Titer 1: NEGATIVE
Cyclic Citrullin Peptide Ab: 7 units (ref 0–19)
Rheumatoid fact SerPl-aCnc: <10 IU/mL (ref <14.0)

## 2020-08-30 ENCOUNTER — Other Ambulatory Visit: Payer: Self-pay | Admitting: Family Medicine

## 2020-08-30 ENCOUNTER — Other Ambulatory Visit: Payer: Self-pay

## 2020-08-30 DIAGNOSIS — M545 Low back pain, unspecified: Secondary | ICD-10-CM

## 2020-08-30 DIAGNOSIS — G43009 Migraine without aura, not intractable, without status migrainosus: Secondary | ICD-10-CM

## 2020-08-30 DIAGNOSIS — F330 Major depressive disorder, recurrent, mild: Secondary | ICD-10-CM

## 2020-08-30 DIAGNOSIS — M25551 Pain in right hip: Secondary | ICD-10-CM

## 2020-08-30 DIAGNOSIS — F988 Other specified behavioral and emotional disorders with onset usually occurring in childhood and adolescence: Secondary | ICD-10-CM

## 2020-08-30 MED ORDER — TERBINAFINE HCL 250 MG PO TABS: 250.0000 mg | ORAL_TABLET | Freq: Every day | ORAL | 0 refills | Status: DC

## 2020-08-30 MED ORDER — METHYLPHENIDATE HCL 20 MG PO TABS: 20.0000 mg | ORAL_TABLET | Freq: Three times a day (TID) | ORAL | 0 refills | Status: DC

## 2020-09-24 ENCOUNTER — Other Ambulatory Visit: Payer: Self-pay

## 2020-09-24 MED ORDER — TERBINAFINE HCL 250 MG PO TABS: 250.0000 mg | ORAL_TABLET | Freq: Every day | ORAL | 0 refills | Status: DC

## 2020-09-27 ENCOUNTER — Other Ambulatory Visit: Payer: Self-pay | Admitting: Family Medicine

## 2020-09-27 DIAGNOSIS — G43009 Migraine without aura, not intractable, without status migrainosus: Secondary | ICD-10-CM

## 2020-09-27 DIAGNOSIS — F330 Major depressive disorder, recurrent, mild: Secondary | ICD-10-CM

## 2020-09-29 NOTE — Progress Notes (Signed)
Subjective:  Patient ID: Anne Brown, female    DOB: March 17, 1970  Age: 50 y.o. 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 Rt medial epicondyle tender.  Lumbar pain: Pt saw Dr. Marshell Levan midlevel provider and was referred to Dr. Regino Schultze for [facility]. Has not had due to shortage of dye.  Lumbar MRI results:  1. At L2-3 there is a mild broad-based disc bulge with a right paracentral disc extrusion resulting in mass effect on the right intraspinal L3 nerve root. Overall moderate spinal stenosis. 2. At L4-5 there is a broad-based disc bulge with a right foraminal/lateral disc protrusion contacting the right L4 nerve root. Moderate right foraminal stenosis. Mild spinal stenosis. 3. At L5-S1 there is a mild broad-based disc bulge. Mild bilateral facet arthropathy. Moderate bilateral foraminal stenosis.  Feeling forgetful, tired all the time. Vitamin D has helped a little. Pt is very anxious because of unexplained sxs. Gets 6-7 hrs.  Working out in the mornings. Low impact PURE BARRE exercises (similar to pilates.)  Current Outpatient Medications on File Prior to Visit  Medication Sig Dispense Refill   amitriptyline (ELAVIL) 100 MG tablet TAKE 1 TABLET BY MOUTH ONCE DAILY AT NIGHT 90 tablet 0   buPROPion (WELLBUTRIN SR) 150 MG 12 hr tablet TAKE 1 TABLET BY MOUTH AT BEDTIME AS NEEDED 90 tablet 3   butalbital-acetaminophen-caffeine (FIORICET) 50-325-40 MG tablet TAKE 1 TO 2 TABLETS BY MOUTH EVERY 6 HOURS AS NEEDED FOR HEADACHE 30 tablet 0   cetirizine (ZYRTEC) 10 MG tablet Take 10 mg by mouth at bedtime.     clonazePAM (KLONOPIN) 0.5 MG tablet Take 1 tablet by mouth twice daily as needed for anxiety 60 tablet 0   diclofenac Sodium (VOLTAREN) 1 % GEL Apply 2 g topically 4 (four) times daily. 20 g 0   Lasmiditan Succinate (REYVOW) 100 MG TABS Take 1  tablet by mouth daily as needed. 8 tablet 3   meclizine (ANTIVERT) 25 MG tablet TAKE 1 TABLET BY MOUTH EVERY 6 HOURS AS NEEDED FOR DIZZINESS 30 tablet 0   meloxicam (MOBIC) 15 MG tablet Take 1 tablet by mouth once daily 90 tablet 0   promethazine (PHENERGAN) 12.5 MG tablet TAKE 1 TABLET BY MOUTH EVERY 6 HOURS AS NEEDED FOR NAUSEA 30 tablet 0   terbinafine (LAMISIL) 250 MG tablet Take 1 tablet (250 mg total) by mouth daily. 90 tablet 0   Vitamin D, Ergocalciferol, (DRISDOL) 1.25 MG (50000 UNIT) CAPS capsule Take 1 capsule (50,000 Units total) by mouth every 7 (seven) days. 5 capsule 2   No current facility-administered medications on file prior to visit.   Past Medical History:  Diagnosis Date   Abnormal Pap smear    ADHD    Anxiety    Family history of adverse reaction to anesthesia    mother n/v severe   Headache    migraines   Lower back pain    LSIL (low grade squamous intraepithelial lesion) on Pap smear 10/15/2007   Past Surgical History:  Procedure Laterality Date   ANTERIOR AND POSTERIOR REPAIR N/A 06/27/2018   Procedure: ANTERIOR (CYSTOCELE) AND POSTERIOR REPAIR (RECTOCELE);  Surgeon: Osborn Coho, MD;  Location: [facility];  Service: Gynecology;  Laterality: N/A;   DILATION AND CURETTAGE OF UTERUS  1992   DILATION AND CURETTAGE OF UTERUS  2009  ECTOPIC PREGNANCY SURGERY  01/2008   After BTL   HYSTEROSCOPY WITH NOVASURE N/A 06/27/2018   Procedure: HYSTEROSCOPY WITH NOVASURE;  Surgeon: Osborn Coho, MD;  Location: [facility];  Service: Gynecology;  Laterality: N/A;   TUBAL LIGATION      Family History  Problem Relation Age of Onset   Depression Mother    Social History   Socioeconomic History   Marital status: Married    Spouse name: Not on file   Number of children: Not on file   Years of education: Not on file   Highest education level: Not on file  Occupational History   Not on file  Tobacco Use   Smoking status: Never    Smokeless tobacco: Never  Vaping Use   Vaping Use: Never used  Substance and Sexual Activity   Alcohol use: Yes    Alcohol/week: 0.0 standard drinks    Comment: 3 glasses a week    Drug use: No   Sexual activity: Yes    Birth control/protection: None, Surgical    Comment: 1st intercourse- 17, partners-2  Other Topics Concern   Not on file  Social History Narrative   Not on file   Social Determinants of Health   Financial Resource Strain: Not on file  Food Insecurity: Not on file  Transportation Needs: Not on file  Physical Activity: Not on file  Stress: Not on file  Social Connections: Not on file    Review of Systems  Constitutional:  Positive for fatigue. Negative for chills and fever.  HENT:  Negative for congestion, ear pain, rhinorrhea and sore throat.   Respiratory:  Negative for cough and shortness of breath.   Cardiovascular:  Positive for leg swelling. Negative for chest pain.  Gastrointestinal:  Negative for abdominal pain, constipation, diarrhea, nausea and vomiting.  Genitourinary:  Negative for dysuria and urgency.  Musculoskeletal:  Positive for arthralgias and back pain. Negative for myalgias.  Neurological:  Positive for numbness (down right leg. Pain. numb in rt foot.). Negative for dizziness, weakness, light-headedness and headaches.       Forgetfulness.  Psychiatric/Behavioral:  Negative for dysphoric mood. The patient is nervous/anxious.     Objective:  BP 122/78   Pulse 90   Temp (!) 96.7 F (35.9 C)   Ht 5\' 1"  (1.549 m)   Wt 155 lb (70.3 kg)   SpO2 97%   BMI 29.29 kg/m   BP/Weight 09/30/2020 07/23/2020 06/22/2020  Systolic BP 122 128 120  Diastolic BP 78 72 84  Wt. (Lbs) 155 160 162  BMI 29.29 30.23 30.61    Physical Exam Vitals reviewed.  Constitutional:      Appearance: Normal appearance. She is normal weight.  Neck:     Vascular: No carotid bruit.  Cardiovascular:     Rate and Rhythm: Normal rate and regular rhythm.     Pulses:  Normal pulses.     Heart sounds: Normal heart sounds.  Pulmonary:     Effort: Pulmonary effort is normal. No respiratory distress.     Breath sounds: Normal breath sounds.  Abdominal:     General: Abdomen is flat. Bowel sounds are normal.     Palpations: Abdomen is soft.     Tenderness: There is no abdominal tenderness.  Musculoskeletal:        General: Tenderness (medial epicondyle (rt>lt), FM trigger points not positive. PIPs swollen and tender. nontender over hands.) present. No swelling.  Neurological:     Mental Status: She is  alert and oriented to person, place, and time.  Psychiatric:        Mood and Affect: Mood normal.        Behavior: Behavior normal.    Diabetic Foot Exam - Simple   No data filed      Lab Results  Component Value Date   WBC 6.0 06/22/2020   HGB 13.6 06/22/2020   HCT 41.4 06/22/2020   PLT 323 06/22/2020   GLUCOSE 96 06/22/2020   CHOL 131 02/27/2014   TRIG 50 05/09/2012   HDL 71 05/09/2012   LDLCALC 52 05/09/2012   ALT 24 06/22/2020   AST 24 06/22/2020   NA 141 06/22/2020   K 4.2 06/22/2020   CL 101 06/22/2020   CREATININE 0.61 06/22/2020   BUN 8 06/22/2020   CO2 25 06/22/2020   TSH 2.790 09/30/2020   INR 0.9 06/22/2020      Assessment & Plan:    Problem List Items Addressed This Visit       Nervous and Auditory   Acute right-sided low back pain with right-sided sciatica - Primary    Prednisone 50 mg once daily x 5 days.  Keep appt with dr. Regino Schultze      Relevant Medications   cyclobenzaprine (FLEXERIL) 5 MG tablet   methylphenidate (RITALIN) 20 MG tablet   predniSONE (DELTASONE) 50 MG tablet     Other   Pain of both elbows    Exam not consistent with fibromyalgia.  Start on prednisone course       Pain in both hands    Lab work up.  Start prednisone.      Other fatigue    Labs done      Relevant Orders   TSH (Completed)   Methylmalonic acid, serum (Completed)   B12 and Folate Panel (Completed)   Forgetfulness     Check labs      Relevant Orders   Methylmalonic acid, serum (Completed)   B12 and Folate Panel (Completed)   Attention deficit disorder (ADD) without hyperactivity    The current medical regimen is effective;  continue present plan and medications.       Relevant Medications   methylphenidate (RITALIN) 20 MG tablet   Primary insomnia   Mild recurrent major depression (HCC)    The current medical regimen is effective;  continue present plan and medications.       Other Visit Diagnoses     Need for immunization against influenza       Relevant Orders   Flu Vaccine QUAD 53mo+IM (Fluarix, Fluzone & Alfiuria Quad PF) (Completed)          Follow-up: Return in about 6 weeks (around 11/11/2020) for fatigue, joint pain.  An After Visit Summary was printed and given to the patient.  Blane Ohara, MD [facility] [phone]

## 2020-09-30 ENCOUNTER — Ambulatory Visit: Payer: 59 | Admitting: Family Medicine

## 2020-09-30 ENCOUNTER — Encounter: Payer: Self-pay | Admitting: Family Medicine

## 2020-09-30 ENCOUNTER — Other Ambulatory Visit: Payer: Self-pay

## 2020-09-30 VITALS — BP 122/78 | HR 90 | Temp 96.7°F | Ht 61.0 in | Wt 155.0 lb

## 2020-09-30 DIAGNOSIS — Z23 Encounter for immunization: Secondary | ICD-10-CM | POA: Diagnosis not present

## 2020-09-30 DIAGNOSIS — R5383 Other fatigue: Secondary | ICD-10-CM | POA: Diagnosis not present

## 2020-09-30 DIAGNOSIS — M5441 Lumbago with sciatica, right side: Secondary | ICD-10-CM

## 2020-09-30 DIAGNOSIS — R6889 Other general symptoms and signs: Secondary | ICD-10-CM

## 2020-09-30 DIAGNOSIS — M25521 Pain in right elbow: Secondary | ICD-10-CM

## 2020-09-30 DIAGNOSIS — M79642 Pain in left hand: Secondary | ICD-10-CM

## 2020-09-30 DIAGNOSIS — F330 Major depressive disorder, recurrent, mild: Secondary | ICD-10-CM

## 2020-09-30 DIAGNOSIS — F5101 Primary insomnia: Secondary | ICD-10-CM

## 2020-09-30 DIAGNOSIS — F988 Other specified behavioral and emotional disorders with onset usually occurring in childhood and adolescence: Secondary | ICD-10-CM

## 2020-09-30 DIAGNOSIS — M79641 Pain in right hand: Secondary | ICD-10-CM | POA: Diagnosis not present

## 2020-09-30 DIAGNOSIS — M25522 Pain in left elbow: Secondary | ICD-10-CM

## 2020-09-30 MED ORDER — METHYLPHENIDATE HCL 20 MG PO TABS: 20.0000 mg | ORAL_TABLET | Freq: Three times a day (TID) | ORAL | 0 refills | Status: DC

## 2020-09-30 MED ORDER — PREDNISONE 50 MG PO TABS: 50.0000 mg | ORAL_TABLET | Freq: Every day | ORAL | 0 refills | Status: DC

## 2020-09-30 MED ORDER — CYCLOBENZAPRINE HCL 5 MG PO TABS: 5.0000 mg | ORAL_TABLET | Freq: Three times a day (TID) | ORAL | 2 refills | Status: DC | PRN

## 2020-09-30 NOTE — Patient Instructions (Addendum)
Prednisone 50 mg once daily x 5 days.  Consider rheumatology referral if labs normal.  Call Dr Juanda Chance office for injection

## 2020-10-04 LAB — TSH: TSH: 2.79 u[IU]/mL (ref 0.450–4.500)

## 2020-10-04 LAB — METHYLMALONIC ACID, SERUM: Methylmalonic Acid: 191 nmol/L (ref 0–378)

## 2020-10-04 LAB — B12 AND FOLATE PANEL
Folate: 12.8 ng/mL (ref >3.0)
Vitamin B-12: 227 pg/mL — ABNORMAL LOW (ref 232–1245)

## 2020-10-10 ENCOUNTER — Encounter: Payer: Self-pay | Admitting: Family Medicine

## 2020-10-10 DIAGNOSIS — F988 Other specified behavioral and emotional disorders with onset usually occurring in childhood and adolescence: Secondary | ICD-10-CM | POA: Insufficient documentation

## 2020-10-10 DIAGNOSIS — R5383 Other fatigue: Secondary | ICD-10-CM | POA: Insufficient documentation

## 2020-10-10 DIAGNOSIS — M79641 Pain in right hand: Secondary | ICD-10-CM | POA: Insufficient documentation

## 2020-10-10 DIAGNOSIS — F330 Major depressive disorder, recurrent, mild: Secondary | ICD-10-CM | POA: Insufficient documentation

## 2020-10-10 DIAGNOSIS — M5441 Lumbago with sciatica, right side: Secondary | ICD-10-CM | POA: Insufficient documentation

## 2020-10-10 DIAGNOSIS — F5101 Primary insomnia: Secondary | ICD-10-CM | POA: Insufficient documentation

## 2020-10-10 DIAGNOSIS — M79642 Pain in left hand: Secondary | ICD-10-CM | POA: Insufficient documentation

## 2020-10-10 DIAGNOSIS — M25521 Pain in right elbow: Secondary | ICD-10-CM | POA: Insufficient documentation

## 2020-10-10 DIAGNOSIS — R6889 Other general symptoms and signs: Secondary | ICD-10-CM | POA: Insufficient documentation

## 2020-10-10 NOTE — Assessment & Plan Note (Signed)
Labs done.

## 2020-10-10 NOTE — Assessment & Plan Note (Addendum)
Check labs B12 came back low. Replace b12 1000 mcg daily.

## 2020-10-10 NOTE — Assessment & Plan Note (Signed)
The current medical regimen is effective;  continue present plan and medications.  

## 2020-10-10 NOTE — Assessment & Plan Note (Signed)
Lab work up.  Start prednisone.

## 2020-10-10 NOTE — Assessment & Plan Note (Signed)
Prednisone 50 mg once daily x 5 days.  Keep appt with dr. Regino Schultze

## 2020-10-10 NOTE — Assessment & Plan Note (Signed)
Exam not consistent with fibromyalgia.  Start on prednisone course

## 2020-10-24 ENCOUNTER — Other Ambulatory Visit: Payer: Self-pay | Admitting: Family Medicine

## 2020-10-24 DIAGNOSIS — G43009 Migraine without aura, not intractable, without status migrainosus: Secondary | ICD-10-CM

## 2020-10-24 DIAGNOSIS — F330 Major depressive disorder, recurrent, mild: Secondary | ICD-10-CM

## 2020-10-25 ENCOUNTER — Ambulatory Visit: Payer: 59 | Admitting: Family Medicine

## 2020-10-25 ENCOUNTER — Encounter: Payer: Self-pay | Admitting: Family Medicine

## 2020-10-25 ENCOUNTER — Other Ambulatory Visit: Payer: Self-pay

## 2020-10-25 VITALS — BP 126/76 | HR 88 | Temp 97.6°F | Resp 18 | Ht 61.0 in | Wt 158.2 lb

## 2020-10-25 DIAGNOSIS — R0683 Snoring: Secondary | ICD-10-CM | POA: Diagnosis not present

## 2020-10-25 DIAGNOSIS — M797 Fibromyalgia: Secondary | ICD-10-CM | POA: Insufficient documentation

## 2020-10-25 DIAGNOSIS — R400 Somnolence: Secondary | ICD-10-CM

## 2020-10-25 DIAGNOSIS — F988 Other specified behavioral and emotional disorders with onset usually occurring in childhood and adolescence: Secondary | ICD-10-CM

## 2020-10-25 DIAGNOSIS — N3000 Acute cystitis without hematuria: Secondary | ICD-10-CM

## 2020-10-25 LAB — POCT URINALYSIS DIP (CLINITEK)
Bilirubin, UA: NEGATIVE
Blood, UA: NEGATIVE
Glucose, UA: NEGATIVE mg/dL
Ketones, POC UA: NEGATIVE mg/dL
Nitrite, UA: NEGATIVE
Spec Grav, UA: 1.01 (ref 1.010–1.025)
Urobilinogen, UA: 0.2 E.U./dL
pH, UA: 7 (ref 5.0–8.0)

## 2020-10-25 MED ORDER — METHYLPHENIDATE HCL 20 MG PO TABS: 20.0000 mg | ORAL_TABLET | Freq: Three times a day (TID) | ORAL | 0 refills | Status: DC

## 2020-10-25 MED ORDER — PREGABALIN 50 MG PO CAPS: 50.0000 mg | ORAL_CAPSULE | Freq: Three times a day (TID) | ORAL | 0 refills | Status: DC

## 2020-10-25 MED ORDER — FLUCONAZOLE 150 MG PO TABS: 150.0000 mg | ORAL_TABLET | Freq: Once | ORAL | 0 refills | Status: AC

## 2020-10-25 MED ORDER — NITROFURANTOIN MONOHYD MACRO 100 MG PO CAPS
100.0000 mg | ORAL_CAPSULE | Freq: Two times a day (BID) | ORAL | 0 refills | Status: DC
Start: 2020-10-25 — End: 2020-12-14

## 2020-10-25 NOTE — Assessment & Plan Note (Addendum)
Start on Macrobid 100 mg twice daily x1 week. Send urine culture. Fluconazole given in case all symptoms do not resolve.  Patient feels she has a yeast infection she may take this.

## 2020-10-25 NOTE — Assessment & Plan Note (Signed)
Well-controlled.  Ritalin refilled

## 2020-10-25 NOTE — Assessment & Plan Note (Signed)
Started on Lyrica 50 mg 3 times daily. Currently on amitriptyline 100 mg once at night. Patient is also on Flexeril already 5 mg 3 times daily.

## 2020-10-25 NOTE — Assessment & Plan Note (Signed)
Ordering home sleep study 

## 2020-10-25 NOTE — Progress Notes (Signed)
Acute Office Visit  Subjective:    Patient ID: Anne Brown, female    DOB: 1970-11-26, 50 y.o.   MRN: [id number]  Chief Complaint  Patient presents with   Urinary Frequency   Vaginal Itching    HPI Patient is in today for urgency, dysuria, vaginal itching, abdomen tenderness x 3 days intermittently.   Patient has aching and muscular pain. Previous work up has been negative. Pt is concerned she may have fibromyalgia. She is very tired. Suffers from chronic fatigue. Snores. Daytime somnolence.  Past Medical History:  Diagnosis Date   Abnormal Pap smear    ADHD    Anxiety    Family history of adverse reaction to anesthesia    mother n/v severe   Headache    migraines   Lower back pain    LSIL (low grade squamous intraepithelial lesion) on Pap smear 10/15/2007    Past Surgical History:  Procedure Laterality Date   ANTERIOR AND POSTERIOR REPAIR N/A 06/27/2018   Procedure: ANTERIOR (CYSTOCELE) AND POSTERIOR REPAIR (RECTOCELE);  Surgeon: Osborn Coho, MD;  Location: [facility];  Service: Gynecology;  Laterality: N/A;   DILATION AND CURETTAGE OF UTERUS  1992   DILATION AND CURETTAGE OF UTERUS  2009   ECTOPIC PREGNANCY SURGERY  01/2008   After BTL   HYSTEROSCOPY WITH NOVASURE N/A 06/27/2018   Procedure: HYSTEROSCOPY WITH NOVASURE;  Surgeon: Osborn Coho, MD;  Location: [facility];  Service: Gynecology;  Laterality: N/A;   TUBAL LIGATION      Family History  Problem Relation Age of Onset   Depression Mother     Social History   Socioeconomic History   Marital status: Married    Spouse name: Not on file   Number of children: Not on file   Years of education: Not on file   Highest education level: Not on file  Occupational History   Not on file  Tobacco Use   Smoking status: Never   Smokeless tobacco: Never  Vaping Use   Vaping Use: Never used  Substance and Sexual Activity   Alcohol use: Yes    Alcohol/week: 0.0 standard drinks     Comment: 3 glasses a week    Drug use: No   Sexual activity: Yes    Birth control/protection: None, Surgical    Comment: 1st intercourse- 17, partners-2  Other Topics Concern   Not on file  Social History Narrative   Not on file   Social Determinants of Health   Financial Resource Strain: Not on file  Food Insecurity: Not on file  Transportation Needs: Not on file  Physical Activity: Not on file  Stress: Not on file  Social Connections: Not on file  Intimate Partner Violence: Not on file    Outpatient Medications Prior to Visit  Medication Sig Dispense Refill   amitriptyline (ELAVIL) 100 MG tablet TAKE 1 TABLET BY MOUTH ONCE DAILY AT NIGHT 90 tablet 0   buPROPion (WELLBUTRIN SR) 150 MG 12 hr tablet TAKE 1 TABLET BY MOUTH AT BEDTIME AS NEEDED 90 tablet 3   butalbital-acetaminophen-caffeine (FIORICET) 50-325-40 MG tablet TAKE 1 TO 2 TABLETS BY MOUTH EVERY 6 HOURS AS NEEDED FOR HEADACHE 30 tablet 0   cetirizine (ZYRTEC) 10 MG tablet Take 10 mg by mouth at bedtime.     clonazePAM (KLONOPIN) 0.5 MG tablet Take 1 tablet by mouth twice daily as needed for anxiety 60 tablet 0   cyclobenzaprine (FLEXERIL) 5 MG tablet Take 1 tablet (5 mg  total) by mouth 3 (three) times daily as needed. for muscle spams 90 tablet 2   diclofenac Sodium (VOLTAREN) 1 % GEL Apply 2 g topically 4 (four) times daily. 20 g 0   Lasmiditan Succinate (REYVOW) 100 MG TABS Take 1 tablet by mouth daily as needed. 8 tablet 3   meclizine (ANTIVERT) 25 MG tablet TAKE 1 TABLET BY MOUTH EVERY 6 HOURS AS NEEDED FOR DIZZINESS 30 tablet 0   meloxicam (MOBIC) 15 MG tablet Take 1 tablet by mouth once daily 90 tablet 0   predniSONE (DELTASONE) 50 MG tablet Take 1 tablet (50 mg total) by mouth daily with breakfast. 5 tablet 0   promethazine (PHENERGAN) 12.5 MG tablet TAKE 1 TABLET BY MOUTH EVERY 6 HOURS AS NEEDED FOR NAUSEA 30 tablet 0   terbinafine (LAMISIL) 250 MG tablet Take 1 tablet (250 mg total) by mouth daily. 90 tablet 0    Vitamin D, Ergocalciferol, (DRISDOL) 1.25 MG (50000 UNIT) CAPS capsule Take 1 capsule (50,000 Units total) by mouth every 7 (seven) days. 5 capsule 2   methylphenidate (RITALIN) 20 MG tablet Take 1 tablet (20 mg total) by mouth 3 (three) times daily with meals. 90 tablet 0   No facility-administered medications prior to visit.    No Known Allergies  Review of Systems  Constitutional:  Negative for chills, fatigue and fever.  HENT:  Positive for rhinorrhea. Negative for congestion, ear pain and sore throat.   Respiratory:  Negative for cough and shortness of breath.   Cardiovascular:  Negative for chest pain.  Genitourinary:  Positive for dysuria and urgency. Negative for difficulty urinating and vaginal discharge.      Objective:    Physical Exam Vitals reviewed.  Constitutional:      Appearance: Normal appearance. She is normal weight.  Neck:     Vascular: No carotid bruit.  Cardiovascular:     Rate and Rhythm: Normal rate and regular rhythm.     Heart sounds: Normal heart sounds.  Pulmonary:     Effort: Pulmonary effort is normal. No respiratory distress.     Breath sounds: Normal breath sounds.  Abdominal:     General: Abdomen is flat. Bowel sounds are normal.     Palpations: Abdomen is soft.     Tenderness: There is abdominal tenderness (Suprapubic). There is no right CVA tenderness or left CVA tenderness.  Musculoskeletal:        General: Tenderness (some FM trigger points tender.) present.  Neurological:     Mental Status: She is alert and oriented to person, place, and time.  Psychiatric:        Mood and Affect: Mood normal.        Behavior: Behavior normal.    BP 126/76   Pulse 88   Temp 97.6 F (36.4 C)   Resp 18   Ht $R'5\' 1"'vW$  (1.549 m)   Wt 158 lb 3.2 oz (71.8 kg)   SpO2 95%   BMI 29.89 kg/m  Wt Readings from Last 3 Encounters:  10/25/20 158 lb 3.2 oz (71.8 kg)  09/30/20 155 lb (70.3 kg)  07/23/20 160 lb (72.6 kg)    Health Maintenance Due  Topic  Date Due   HIV Screening  Never done   Hepatitis C Screening  Never done   TETANUS/TDAP  Never done   COLONOSCOPY (Pts 45-39yrs Insurance coverage will need to be confirmed)  Never done   PAP SMEAR-Modifier  02/27/2017    There are no preventive care reminders to  display for this patient.   Lab Results  Component Value Date   TSH 2.790 09/30/2020   Lab Results  Component Value Date   WBC 6.0 06/22/2020   HGB 13.6 06/22/2020   HCT 41.4 06/22/2020   MCV 96 06/22/2020   PLT 323 06/22/2020   Lab Results  Component Value Date   NA 141 06/22/2020   K 4.2 06/22/2020   CO2 25 06/22/2020   GLUCOSE 96 06/22/2020   BUN 8 06/22/2020   CREATININE 0.61 06/22/2020   BILITOT 0.3 06/22/2020   ALKPHOS 102 06/22/2020   AST 24 06/22/2020   ALT 24 06/22/2020   PROT 7.0 06/22/2020   ALBUMIN 4.5 06/22/2020   CALCIUM 9.5 06/22/2020   ANIONGAP 7 06/24/2018   EGFR 110 06/22/2020   Lab Results  Component Value Date   CHOL 131 02/27/2014   Lab Results  Component Value Date   HDL 71 05/09/2012   Lab Results  Component Value Date   LDLCALC 52 05/09/2012   Lab Results  Component Value Date   TRIG 50 05/09/2012   Lab Results  Component Value Date   CHOLHDL 1.9 05/09/2012   No results found for: HGBA1C     Assessment & Plan:   Problem List Items Addressed This Visit       Genitourinary   Acute cystitis without hematuria - Primary   Relevant Medications   nitrofurantoin, macrocrystal-monohydrate, (MACROBID) 100 MG capsule   Other Relevant Orders   POCT URINALYSIS DIP (CLINITEK) (Completed)     Other   Daytime somnolence   Relevant Orders   Home sleep test   Snoring   Relevant Orders   Home sleep test   Fibromyalgia   Relevant Medications   pregabalin (LYRICA) 50 MG capsule   Attention deficit disorder (ADD) without hyperactivity   Relevant Medications   methylphenidate (RITALIN) 20 MG tablet   Meds ordered this encounter  Medications   pregabalin (LYRICA) 50  MG capsule    Sig: Take 1 capsule (50 mg total) by mouth 3 (three) times daily.    Dispense:  90 capsule    Refill:  0   nitrofurantoin, macrocrystal-monohydrate, (MACROBID) 100 MG capsule    Sig: Take 1 capsule (100 mg total) by mouth 2 (two) times daily.    Dispense:  14 capsule    Refill:  0   fluconazole (DIFLUCAN) 150 MG tablet    Sig: Take 1 tablet (150 mg total) by mouth once for 1 dose.    Dispense:  1 tablet    Refill:  0   methylphenidate (RITALIN) 20 MG tablet    Sig: Take 1 tablet (20 mg total) by mouth 3 (three) times daily with meals.    Dispense:  90 tablet    Refill:  0    Orders Placed This Encounter  Procedures   POCT URINALYSIS DIP (CLINITEK)   Home sleep test     Follow-up: No follow-ups on file.  An After Visit Summary was printed and given to the patient.  Rochel Brome, MD [facility] [phone]

## 2020-10-29 LAB — URINE CULTURE

## 2020-11-17 ENCOUNTER — Ambulatory Visit: Payer: 59 | Admitting: Family Medicine

## 2020-11-21 ENCOUNTER — Other Ambulatory Visit: Payer: Self-pay | Admitting: Nurse Practitioner

## 2020-11-21 ENCOUNTER — Other Ambulatory Visit: Payer: Self-pay | Admitting: Family Medicine

## 2020-11-21 DIAGNOSIS — M797 Fibromyalgia: Secondary | ICD-10-CM

## 2020-11-21 DIAGNOSIS — F330 Major depressive disorder, recurrent, mild: Secondary | ICD-10-CM

## 2020-12-01 ENCOUNTER — Other Ambulatory Visit: Payer: Self-pay

## 2020-12-01 DIAGNOSIS — F988 Other specified behavioral and emotional disorders with onset usually occurring in childhood and adolescence: Secondary | ICD-10-CM

## 2020-12-01 MED ORDER — METHYLPHENIDATE HCL 20 MG PO TABS: 20.0000 mg | ORAL_TABLET | Freq: Three times a day (TID) | ORAL | 0 refills | Status: DC

## 2020-12-07 ENCOUNTER — Encounter: Payer: Self-pay | Admitting: Nurse Practitioner

## 2020-12-07 ENCOUNTER — Other Ambulatory Visit: Payer: Self-pay | Admitting: Family Medicine

## 2020-12-07 ENCOUNTER — Other Ambulatory Visit: Payer: Self-pay | Admitting: Nurse Practitioner

## 2020-12-07 ENCOUNTER — Telehealth (INDEPENDENT_AMBULATORY_CARE_PROVIDER_SITE_OTHER): Payer: 59 | Admitting: Nurse Practitioner

## 2020-12-07 VITALS — HR 94 | Temp 97.3°F

## 2020-12-07 DIAGNOSIS — U071 COVID-19: Secondary | ICD-10-CM

## 2020-12-07 DIAGNOSIS — R051 Acute cough: Secondary | ICD-10-CM

## 2020-12-07 DIAGNOSIS — F988 Other specified behavioral and emotional disorders with onset usually occurring in childhood and adolescence: Secondary | ICD-10-CM

## 2020-12-07 LAB — POCT RAPID STREP A (OFFICE): Rapid Strep A Screen: NEGATIVE

## 2020-12-07 LAB — POC COVID19 BINAXNOW: SARS Coronavirus 2 Ag: POSITIVE — AB

## 2020-12-07 LAB — POCT INFLUENZA A/B
Influenza A, POC: NEGATIVE
Influenza B, POC: NEGATIVE

## 2020-12-07 MED ORDER — METHYLPHENIDATE HCL 20 MG PO TABS: 20.0000 mg | ORAL_TABLET | Freq: Three times a day (TID) | ORAL | 0 refills | Status: DC

## 2020-12-07 MED ORDER — AMITRIPTYLINE HCL 100 MG PO TABS: 100.0000 mg | ORAL_TABLET | Freq: Every day | ORAL | 0 refills | Status: DC

## 2020-12-07 MED ORDER — NIRMATRELVIR/RITONAVIR (PAXLOVID)TABLET
3.0000 | ORAL_TABLET | Freq: Two times a day (BID) | ORAL | 0 refills | Status: AC
Start: 2020-12-07 — End: 2020-12-12

## 2020-12-07 MED ORDER — PROMETHAZINE-DM 6.25-15 MG/5ML PO SYRP: 5.0000 mL | ORAL_SOLUTION | Freq: Four times a day (QID) | ORAL | 0 refills | Status: DC | PRN

## 2020-12-07 NOTE — Progress Notes (Deleted)
Acute Office Visit  Subjective:    Patient ID: Anne Brown, female    DOB: 1970/11/27, 50 y.o.   MRN: [id number]  Chief Complaint  Patient presents with   URI    HPI: Patient is in today for Upper respiratory symptoms She complains of {uri sx's' brief:15453}.with {systemic_sx:15294}. Onset of symptoms was {onset initial:119223} and {progression:119226}.She {hydration history:15378}.  Past history is significant for {respiratory illness:412}. Patient is {smoker?:15292}   Past Medical History:  Diagnosis Date   Abnormal Pap smear    ADHD    Anxiety    Family history of adverse reaction to anesthesia    mother n/v severe   Headache    migraines   Lower back pain    LSIL (low grade squamous intraepithelial lesion) on Pap smear 10/15/2007    Past Surgical History:  Procedure Laterality Date   ANTERIOR AND POSTERIOR REPAIR N/A 06/27/2018   Procedure: ANTERIOR (CYSTOCELE) AND POSTERIOR REPAIR (RECTOCELE);  Surgeon: Osborn Coho, MD;  Location: [facility];  Service: Gynecology;  Laterality: N/A;   DILATION AND CURETTAGE OF UTERUS  1992   DILATION AND CURETTAGE OF UTERUS  2009   ECTOPIC PREGNANCY SURGERY  01/2008   After BTL   HYSTEROSCOPY WITH NOVASURE N/A 06/27/2018   Procedure: HYSTEROSCOPY WITH NOVASURE;  Surgeon: Osborn Coho, MD;  Location: [facility];  Service: Gynecology;  Laterality: N/A;   TUBAL LIGATION      Family History  Problem Relation Age of Onset   Depression Mother     Social History   Socioeconomic History   Marital status: Married    Spouse name: Not on file   Number of children: Not on file   Years of education: Not on file   Highest education level: Not on file  Occupational History   Not on file  Tobacco Use   Smoking status: Never   Smokeless tobacco: Never  Vaping Use   Vaping Use: Never used  Substance and Sexual Activity   Alcohol use: Yes    Alcohol/week: 0.0 standard drinks    Comment: 3 glasses a  week    Drug use: No   Sexual activity: Yes    Birth control/protection: None, Surgical    Comment: 1st intercourse- 17, partners-2  Other Topics Concern   Not on file  Social History Narrative   Not on file   Social Determinants of Health   Financial Resource Strain: Not on file  Food Insecurity: Not on file  Transportation Needs: Not on file  Physical Activity: Not on file  Stress: Not on file  Social Connections: Not on file  Intimate Partner Violence: Not on file    Outpatient Medications Prior to Visit  Medication Sig Dispense Refill   amitriptyline (ELAVIL) 100 MG tablet TAKE 1 TABLET BY MOUTH ONCE DAILY AT NIGHT 90 tablet 0   buPROPion (WELLBUTRIN SR) 150 MG 12 hr tablet TAKE 1 TABLET BY MOUTH AT BEDTIME AS NEEDED 90 tablet 3   butalbital-acetaminophen-caffeine (FIORICET) 50-325-40 MG tablet TAKE 1 TO 2 TABLETS BY MOUTH EVERY 6 HOURS AS NEEDED FOR HEADACHE 30 tablet 0   cetirizine (ZYRTEC) 10 MG tablet Take 10 mg by mouth at bedtime.     CLENPIQ 10-3.5-12 MG-GM -GM/160ML SOLN Take by mouth as directed.     clonazePAM (KLONOPIN) 0.5 MG tablet Take 1 tablet by mouth twice daily as needed for anxiety 60 tablet 2   cyclobenzaprine (FLEXERIL) 5 MG tablet Take 1 tablet (5 mg total)  by mouth 3 (three) times daily as needed. for muscle spams 90 tablet 2   diclofenac Sodium (VOLTAREN) 1 % GEL Apply 2 g topically 4 (four) times daily. 20 g 0   Lasmiditan Succinate (REYVOW) 100 MG TABS Take 1 tablet by mouth daily as needed. 8 tablet 3   meclizine (ANTIVERT) 25 MG tablet TAKE 1 TABLET BY MOUTH EVERY 6 HOURS AS NEEDED FOR DIZZINESS 30 tablet 0   meloxicam (MOBIC) 15 MG tablet Take 1 tablet by mouth once daily 90 tablet 0   methylphenidate (RITALIN) 20 MG tablet Take 1 tablet (20 mg total) by mouth 3 (three) times daily with meals. 90 tablet 0   nitrofurantoin, macrocrystal-monohydrate, (MACROBID) 100 MG capsule Take 1 capsule (100 mg total) by mouth 2 (two) times daily. 14 capsule 0    predniSONE (DELTASONE) 50 MG tablet Take 1 tablet (50 mg total) by mouth daily with breakfast. 5 tablet 0   pregabalin (LYRICA) 50 MG capsule TAKE 1 CAPSULE BY MOUTH THREE TIMES DAILY 90 capsule 0   promethazine (PHENERGAN) 12.5 MG tablet TAKE 1 TABLET BY MOUTH EVERY 6 HOURS AS NEEDED FOR NAUSEA 30 tablet 0   terbinafine (LAMISIL) 250 MG tablet Take 1 tablet (250 mg total) by mouth daily. 90 tablet 0   Vitamin D, Ergocalciferol, (DRISDOL) 1.25 MG (50000 UNIT) CAPS capsule Take 1 capsule (50,000 Units total) by mouth every 7 (seven) days. 5 capsule 2   No facility-administered medications prior to visit.    No Known Allergies  Review of Systems     Objective:    Physical Exam  There were no vitals taken for this visit. Wt Readings from Last 3 Encounters:  10/25/20 158 lb 3.2 oz (71.8 kg)  09/30/20 155 lb (70.3 kg)  07/23/20 160 lb (72.6 kg)    Health Maintenance Due  Topic Date Due   TETANUS/TDAP  Never done   COLONOSCOPY (Pts 45-80yrs Insurance coverage will need to be confirmed)  Never done   PAP SMEAR-Modifier  02/27/2017    There are no preventive care reminders to display for this patient.   Lab Results  Component Value Date   TSH 2.790 09/30/2020   Lab Results  Component Value Date   WBC 6.0 06/22/2020   HGB 13.6 06/22/2020   HCT 41.4 06/22/2020   MCV 96 06/22/2020   PLT 323 06/22/2020   Lab Results  Component Value Date   NA 141 06/22/2020   K 4.2 06/22/2020   CO2 25 06/22/2020   GLUCOSE 96 06/22/2020   BUN 8 06/22/2020   CREATININE 0.61 06/22/2020   BILITOT 0.3 06/22/2020   ALKPHOS 102 06/22/2020   AST 24 06/22/2020   ALT 24 06/22/2020   PROT 7.0 06/22/2020   ALBUMIN 4.5 06/22/2020   CALCIUM 9.5 06/22/2020   ANIONGAP 7 06/24/2018   EGFR 110 06/22/2020   Lab Results  Component Value Date   CHOL 131 02/27/2014   Lab Results  Component Value Date   HDL 71 05/09/2012   Lab Results  Component Value Date   LDLCALC 52 05/09/2012   Lab  Results  Component Value Date   TRIG 50 05/09/2012   Lab Results  Component Value Date   CHOLHDL 1.9 05/09/2012   No results found for: HGBA1C     Assessment & Plan:   Problem List Items Addressed This Visit   None Visit Diagnoses     Acute cough    -  Primary   Relevant Orders  POC COVID-19 BinaxNow   POCT Influenza A/B   POCT rapid strep A      No orders of the defined types were placed in this encounter.   Orders Placed This Encounter  Procedures   POC COVID-19 BinaxNow   POCT Influenza A/B   POCT rapid strep A     Follow-up: No follow-ups on file.  An After Visit Summary was printed and given to the patient.  Rip Harbour, NP [place] [phone]

## 2020-12-07 NOTE — Progress Notes (Signed)
Virtual Visit via Video Note   This visit type was conducted due to national recommendations for restrictions regarding the COVID-19 Pandemic (e.g. social distancing) in an effort to limit this patient's exposure and mitigate transmission in our community.  Due to her co-morbid illnesses, this patient is at least at moderate risk for complications without adequate follow up.  This format is felt to be most appropriate for this patient at this time.  All issues noted in this document were discussed and addressed.  A limited physical exam was performed with this format.  A verbal consent was obtained for the virtual visit.   Date:  12/07/2020   ID:  Anne Brown, DOB Oct 27, 1970, MRN [id number]  Patient Location: Other:  [facility] parking log Provider Location: Office/Clinic  PCP:  Blane Ohara, MD   Evaluation Performed:  Follow-Up Visit  Chief Complaint:  cough  History of Present Illness:    Anne Brown is a 50 y.o. female with cough, sinus congestion, and fatigue. Onset of symptoms was three days ago. Denies previous treatment. She denies known exposure to ill-contacts. She has obtained COVID-19 and influenza vaccines.  The patient does have symptoms concerning for COVID-19 infection (fever, chills, cough, or new shortness of breath).   Past Medical History:  Diagnosis Date  . Abnormal Pap smear   . ADHD   . Anxiety   . Family history of adverse reaction to anesthesia    mother n/v severe  . Headache    migraines  . Lower back pain   . LSIL (low grade squamous intraepithelial lesion) on Pap smear 10/15/2007    Past Surgical History:  Procedure Laterality Date  . ANTERIOR AND POSTERIOR REPAIR N/A 06/27/2018   Procedure: ANTERIOR (CYSTOCELE) AND POSTERIOR REPAIR (RECTOCELE);  Surgeon: Osborn Coho, MD;  Location: [facility];  Service: Gynecology;  Laterality: N/A;  . DILATION AND CURETTAGE OF UTERUS  1992  . DILATION AND CURETTAGE OF UTERUS  2009   . ECTOPIC PREGNANCY SURGERY  01/2008   After BTL  . HYSTEROSCOPY WITH NOVASURE N/A 06/27/2018   Procedure: HYSTEROSCOPY WITH NOVASURE;  Surgeon: Osborn Coho, MD;  Location: [facility];  Service: Gynecology;  Laterality: N/A;  . TUBAL LIGATION      Family History  Problem Relation Age of Onset  . Depression Mother     Social History   Socioeconomic History  . Marital status: Married    Spouse name: Not on file  . Number of children: Not on file  . Years of education: Not on file  . Highest education level: Not on file  Occupational History  . Not on file  Tobacco Use  . Smoking status: Never  . Smokeless tobacco: Never  Vaping Use  . Vaping Use: Never used  Substance and Sexual Activity  . Alcohol use: Yes    Alcohol/week: 0.0 standard drinks    Comment: 3 glasses a week   . Drug use: No  . Sexual activity: Yes    Birth control/protection: None, Surgical    Comment: 1st intercourse- 17, partners-2  Other Topics Concern  . Not on file  Social History Narrative  . Not on file   Social Determinants of Health   Financial Resource Strain: Not on file  Food Insecurity: Not on file  Transportation Needs: Not on file  Physical Activity: Not on file  Stress: Not on file  Social Connections: Not on file  Intimate Partner Violence: Not on file  Outpatient Medications Prior to Visit  Medication Sig Dispense Refill  . amitriptyline (ELAVIL) 100 MG tablet TAKE 1 TABLET BY MOUTH ONCE DAILY AT NIGHT 90 tablet 0  . buPROPion (WELLBUTRIN SR) 150 MG 12 hr tablet TAKE 1 TABLET BY MOUTH AT BEDTIME AS NEEDED 90 tablet 3  . butalbital-acetaminophen-caffeine (FIORICET) 50-325-40 MG tablet TAKE 1 TO 2 TABLETS BY MOUTH EVERY 6 HOURS AS NEEDED FOR HEADACHE 30 tablet 0  . cetirizine (ZYRTEC) 10 MG tablet Take 10 mg by mouth at bedtime.    Marland Kitchen CLENPIQ 10-3.5-12 MG-GM -GM/160ML SOLN Take by mouth as directed.    . clonazePAM (KLONOPIN) 0.5 MG tablet Take 1 tablet by  mouth twice daily as needed for anxiety 60 tablet 2  . cyclobenzaprine (FLEXERIL) 5 MG tablet Take 1 tablet (5 mg total) by mouth 3 (three) times daily as needed. for muscle spams 90 tablet 2  . diclofenac Sodium (VOLTAREN) 1 % GEL Apply 2 g topically 4 (four) times daily. 20 g 0  . Lasmiditan Succinate (REYVOW) 100 MG TABS Take 1 tablet by mouth daily as needed. 8 tablet 3  . meclizine (ANTIVERT) 25 MG tablet TAKE 1 TABLET BY MOUTH EVERY 6 HOURS AS NEEDED FOR DIZZINESS 30 tablet 0  . meloxicam (MOBIC) 15 MG tablet Take 1 tablet by mouth once daily 90 tablet 0  . methylphenidate (RITALIN) 20 MG tablet Take 1 tablet (20 mg total) by mouth 3 (three) times daily with meals. 90 tablet 0  . nitrofurantoin, macrocrystal-monohydrate, (MACROBID) 100 MG capsule Take 1 capsule (100 mg total) by mouth 2 (two) times daily. 14 capsule 0  . predniSONE (DELTASONE) 50 MG tablet Take 1 tablet (50 mg total) by mouth daily with breakfast. 5 tablet 0  . pregabalin (LYRICA) 50 MG capsule TAKE 1 CAPSULE BY MOUTH THREE TIMES DAILY 90 capsule 0  . promethazine (PHENERGAN) 12.5 MG tablet TAKE 1 TABLET BY MOUTH EVERY 6 HOURS AS NEEDED FOR NAUSEA 30 tablet 0  . terbinafine (LAMISIL) 250 MG tablet Take 1 tablet (250 mg total) by mouth daily. 90 tablet 0  . Vitamin D, Ergocalciferol, (DRISDOL) 1.25 MG (50000 UNIT) CAPS capsule Take 1 capsule (50,000 Units total) by mouth every 7 (seven) days. 5 capsule 2   No facility-administered medications prior to visit.    Allergies:   Patient has no known allergies.   Social History   Tobacco Use  . Smoking status: Never  . Smokeless tobacco: Never  Vaping Use  . Vaping Use: Never used  Substance Use Topics  . Alcohol use: Yes    Alcohol/week: 0.0 standard drinks    Comment: 3 glasses a week   . Drug use: No     Review of Systems  Constitutional:  Positive for chills and malaise/fatigue.  Eyes: Negative.   Respiratory:  Positive for cough. Negative for shortness of  breath.   Gastrointestinal: Negative.   Musculoskeletal:  Positive for myalgias (generalized body aches).  Skin:  Negative for rash.  Neurological:  Positive for headaches.  Endo/Heme/Allergies:  Does not bruise/bleed easily.    Labs/Other Tests and Data Reviewed:    Recent Labs: 06/22/2020: ALT 24; BUN 8; Creatinine, Ser 0.61; Hemoglobin 13.6; Platelets 323; Potassium 4.2; Sodium 141 09/30/2020: TSH 2.790   Recent Lipid Panel Lab Results  Component Value Date/Time   CHOL 131 02/27/2014 03:00 PM   TRIG 50 05/09/2012 11:00 AM   HDL 71 05/09/2012 11:00 AM   CHOLHDL 1.9 05/09/2012 11:00 AM   LDLCALC 52  05/09/2012 11:00 AM    Wt Readings from Last 3 Encounters:  10/25/20 158 lb 3.2 oz (71.8 kg)  09/30/20 155 lb (70.3 kg)  07/23/20 160 lb (72.6 kg)     Objective:    Vital Signs:  Pulse 94   Temp (!) 97.3 F (36.3 C)   SpO2 99%     Physical Exam No physical exam due to telemedicine visit  ASSESSMENT & PLAN:    1. Lab test positive for detection of COVID-19 virus - nirmatrelvir/ritonavir EUA (PAXLOVID) 20 x 150 MG & 10 x 100MG  TABS; Take 3 tablets by mouth 2 (two) times daily for 5 days. (Take nirmatrelvir 150 mg two tablets twice daily for 5 days and ritonavir 100 mg one tablet twice daily for 5 days) Patient GFR is 110  Dispense: 30 tablet; Refill: 0  2. Acute cough - POC COVID-19 BinaxNow-POSITIVE - POCT Influenza A/B-NEGATIVE - POCT rapid strep A-NEGATIVE - promethazine-dextromethorphan (PROMETHAZINE-DM) 6.25-15 MG/5ML syrup; Take 5 mLs by mouth 4 (four) times daily as needed.  Dispense: 118 mL; Refill: 0   Rest and push fluid Symptom management with OTC medications Follow-up as needed   Orders Placed This Encounter  Procedures  . POC COVID-19 BinaxNow  . POCT Influenza A/B  . POCT rapid strep A       COVID-19 Education: The signs and symptoms of COVID-19 were discussed with the patient and how to seek care for testing (follow up with PCP or arrange E-visit).  The importance of social distancing was discussed today.   I spent 10 minutes dedicated to the care of this patient on the date of this encounter to include face-to-face time with the patient, as well as: EMR and prescription medication management.   Follow Up:  In Person prn  Signed, 03-27-1990, NP  12/07/2020 10:37 AM    [facility] [place]

## 2020-12-14 ENCOUNTER — Ambulatory Visit: Payer: 59 | Admitting: Family Medicine

## 2020-12-14 ENCOUNTER — Other Ambulatory Visit: Payer: Self-pay

## 2020-12-14 ENCOUNTER — Encounter: Payer: Self-pay | Admitting: Family Medicine

## 2020-12-14 DIAGNOSIS — F988 Other specified behavioral and emotional disorders with onset usually occurring in childhood and adolescence: Secondary | ICD-10-CM

## 2020-12-14 DIAGNOSIS — M797 Fibromyalgia: Secondary | ICD-10-CM

## 2020-12-14 MED ORDER — PREGABALIN 50 MG PO CAPS: 50.0000 mg | ORAL_CAPSULE | Freq: Three times a day (TID) | ORAL | 1 refills | Status: DC

## 2020-12-14 MED ORDER — METHYLPHENIDATE HCL 20 MG PO TABS: 20.0000 mg | ORAL_TABLET | Freq: Three times a day (TID) | ORAL | 0 refills | Status: DC

## 2020-12-14 NOTE — Progress Notes (Signed)
Subjective:  Patient ID: Anne Brown, female    DOB: 03-15-1970  Age: 50 y.o. MRN: [id number]  Chief Complaint  Patient presents with   Joint Pain    HPI Fibromyalgia: On lyrica 50 mg one three times a day. On amitriptyline 100 mg once daily at night. On flexeril 5 mg one three times a day prn. Feels much better.   ADHD: ON ritalin. Does well.   Current Outpatient Medications on File Prior to Visit  Medication Sig Dispense Refill   amitriptyline (ELAVIL) 100 MG tablet Take 1 tablet (100 mg total) by mouth at bedtime. 90 tablet 0   buPROPion (WELLBUTRIN SR) 150 MG 12 hr tablet TAKE 1 TABLET BY MOUTH AT BEDTIME AS NEEDED 90 tablet 3   butalbital-acetaminophen-caffeine (FIORICET) 50-325-40 MG tablet TAKE 1 TO 2 TABLETS BY MOUTH EVERY 6 HOURS AS NEEDED FOR HEADACHE 30 tablet 0   cetirizine (ZYRTEC) 10 MG tablet Take 10 mg by mouth at bedtime.     CLENPIQ 10-3.5-12 MG-GM -GM/160ML SOLN Take by mouth as directed.     clonazePAM (KLONOPIN) 0.5 MG tablet Take 1 tablet by mouth twice daily as needed for anxiety 60 tablet 2   cyclobenzaprine (FLEXERIL) 5 MG tablet Take 1 tablet (5 mg total) by mouth 3 (three) times daily as needed. for muscle spams 90 tablet 2   diclofenac Sodium (VOLTAREN) 1 % GEL Apply 2 g topically 4 (four) times daily. 20 g 0   Lasmiditan Succinate (REYVOW) 100 MG TABS Take 1 tablet by mouth daily as needed. 8 tablet 3   meclizine (ANTIVERT) 25 MG tablet TAKE 1 TABLET BY MOUTH EVERY 6 HOURS AS NEEDED FOR DIZZINESS 30 tablet 0   meloxicam (MOBIC) 15 MG tablet Take 1 tablet by mouth once daily 90 tablet 0   promethazine (PHENERGAN) 12.5 MG tablet TAKE 1 TABLET BY MOUTH EVERY 6 HOURS AS NEEDED FOR NAUSEA 30 tablet 0   promethazine-dextromethorphan (PROMETHAZINE-DM) 6.25-15 MG/5ML syrup Take 5 mLs by mouth 4 (four) times daily as needed. 118 mL 0   terbinafine (LAMISIL) 250 MG tablet Take 1 tablet (250 mg total) by mouth daily. 90 tablet 0   Vitamin D, Ergocalciferol, (DRISDOL)  1.25 MG (50000 UNIT) CAPS capsule Take 1 capsule (50,000 Units total) by mouth every 7 (seven) days. 5 capsule 2   No current facility-administered medications on file prior to visit.   Past Medical History:  Diagnosis Date   Abnormal Pap smear    ADHD    Anxiety    Family history of adverse reaction to anesthesia    mother n/v severe   Headache    migraines   Lower back pain    LSIL (low grade squamous intraepithelial lesion) on Pap smear 10/15/2007   Past Surgical History:  Procedure Laterality Date   ANTERIOR AND POSTERIOR REPAIR N/A 06/27/2018   Procedure: ANTERIOR (CYSTOCELE) AND POSTERIOR REPAIR (RECTOCELE);  Surgeon: Osborn Coho, MD;  Location: [facility];  Service: Gynecology;  Laterality: N/A;   DILATION AND CURETTAGE OF UTERUS  1992   DILATION AND CURETTAGE OF UTERUS  2009   ECTOPIC PREGNANCY SURGERY  01/2008   After BTL   HYSTEROSCOPY WITH NOVASURE N/A 06/27/2018   Procedure: HYSTEROSCOPY WITH NOVASURE;  Surgeon: Osborn Coho, MD;  Location: [facility];  Service: Gynecology;  Laterality: N/A;   TUBAL LIGATION      Family History  Problem Relation Age of Onset   Depression Mother    Social History  Socioeconomic History   Marital status: Married    Spouse name: Not on file   Number of children: Not on file   Years of education: Not on file   Highest education level: Not on file  Occupational History   Not on file  Tobacco Use   Smoking status: Never   Smokeless tobacco: Never  Vaping Use   Vaping Use: Never used  Substance and Sexual Activity   Alcohol use: Yes    Alcohol/week: 0.0 standard drinks    Comment: 3 glasses a week    Drug use: No   Sexual activity: Yes    Birth control/protection: None, Surgical    Comment: 1st intercourse- 17, partners-2  Other Topics Concern   Not on file  Social History Narrative   Not on file   Social Determinants of Health   Financial Resource Strain: Not on file  Food  Insecurity: Not on file  Transportation Needs: Not on file  Physical Activity: Not on file  Stress: Not on file  Social Connections: Not on file    Review of Systems  Constitutional:  Negative for chills, fatigue and fever.  HENT:  Negative for congestion, ear pain and sore throat.   Respiratory:  Negative for cough and shortness of breath.   Cardiovascular:  Negative for chest pain and palpitations.  Gastrointestinal:  Negative for abdominal pain, constipation, diarrhea, nausea and vomiting.  Endocrine: Negative for polydipsia, polyphagia and polyuria.  Genitourinary:  Negative for difficulty urinating and dysuria.  Musculoskeletal:  Negative for arthralgias, back pain and myalgias.  Skin:  Negative for rash.  Neurological:  Negative for headaches.  Psychiatric/Behavioral:  Negative for dysphoric mood. The patient is not nervous/anxious.     Objective:  BP 126/80   Pulse 93   Temp 98 F (36.7 C)   Resp 16   Ht 5\' 1"  (1.549 m)   Wt 163 lb (73.9 kg)   SpO2 97%   BMI 30.80 kg/m   BP/Weight 12/14/2020 10/25/2020 09/30/2020  Systolic BP 126 126 122  Diastolic BP 80 76 78  Wt. (Lbs) 163 158.2 155  BMI 30.8 29.89 29.29    Physical Exam Vitals reviewed.  Constitutional:      Appearance: Normal appearance. She is normal weight.  Neck:     Vascular: No carotid bruit.  Cardiovascular:     Rate and Rhythm: Normal rate and regular rhythm.     Heart sounds: Normal heart sounds.  Pulmonary:     Effort: Pulmonary effort is normal. No respiratory distress.     Breath sounds: Normal breath sounds.  Abdominal:     General: Abdomen is flat. Bowel sounds are normal.     Palpations: Abdomen is soft.     Tenderness: There is no abdominal tenderness.  Musculoskeletal:        General: No tenderness (negative FM trigger points. Nearly fully resolved.).  Neurological:     Mental Status: She is alert and oriented to person, place, and time.  Psychiatric:        Mood and Affect: Mood  normal.        Behavior: Behavior normal.    Diabetic Foot Exam - Simple   No data filed      Lab Results  Component Value Date   WBC 6.0 06/22/2020   HGB 13.6 06/22/2020   HCT 41.4 06/22/2020   PLT 323 06/22/2020   GLUCOSE 96 06/22/2020   CHOL 131 02/27/2014   TRIG 50 05/09/2012   HDL  71 05/09/2012   LDLCALC 52 05/09/2012   ALT 24 06/22/2020   AST 24 06/22/2020   NA 141 06/22/2020   K 4.2 06/22/2020   CL 101 06/22/2020   CREATININE 0.61 06/22/2020   BUN 8 06/22/2020   CO2 25 06/22/2020   TSH 2.790 09/30/2020   INR 0.9 06/22/2020      Assessment & Plan:   Problem List Items Addressed This Visit       Other   Attention deficit disorder (ADD) without hyperactivity    The current medical regimen is effective;  continue present plan and medications.       Relevant Medications   methylphenidate (RITALIN) 20 MG tablet   Fibromyalgia    Continue lyrica.  Greatly improved.      .  Meds ordered this encounter  Medications   methylphenidate (RITALIN) 20 MG tablet    Sig: Take 1 tablet (20 mg total) by mouth 3 (three) times daily with meals.    Dispense:  90 tablet    Refill:  0   DISCONTD: pregabalin (LYRICA) 50 MG capsule    Sig: Take 1 capsule (50 mg total) by mouth 3 (three) times daily.    Dispense:  270 capsule    Refill:  1    No orders of the defined types were placed in this encounter.    Follow-up: Return in about 3 months (around 03/16/2021) for chronic fasting.  An After Visit Summary was printed and given to the patient.  Blane Ohara, MD [facility] [phone]

## 2020-12-20 ENCOUNTER — Other Ambulatory Visit: Payer: Self-pay | Admitting: Family Medicine

## 2020-12-20 DIAGNOSIS — M797 Fibromyalgia: Secondary | ICD-10-CM

## 2021-01-03 NOTE — Assessment & Plan Note (Signed)
The current medical regimen is effective;  continue present plan and medications.  

## 2021-01-03 NOTE — Assessment & Plan Note (Signed)
Continue lyrica.  Greatly improved.

## 2021-01-20 ENCOUNTER — Other Ambulatory Visit: Payer: Self-pay | Admitting: Nurse Practitioner

## 2021-01-20 DIAGNOSIS — M545 Low back pain, unspecified: Secondary | ICD-10-CM

## 2021-01-20 DIAGNOSIS — M25552 Pain in left hip: Secondary | ICD-10-CM

## 2021-01-20 DIAGNOSIS — F988 Other specified behavioral and emotional disorders with onset usually occurring in childhood and adolescence: Secondary | ICD-10-CM

## 2021-01-20 MED ORDER — METHYLPHENIDATE HCL 20 MG PO TABS: 20.0000 mg | ORAL_TABLET | Freq: Three times a day (TID) | ORAL | 0 refills | Status: DC

## 2021-01-25 ENCOUNTER — Other Ambulatory Visit: Payer: Self-pay

## 2021-01-25 DIAGNOSIS — F988 Other specified behavioral and emotional disorders with onset usually occurring in childhood and adolescence: Secondary | ICD-10-CM

## 2021-01-25 MED ORDER — METHYLPHENIDATE HCL 20 MG PO TABS: 20.0000 mg | ORAL_TABLET | Freq: Three times a day (TID) | ORAL | 0 refills | Status: DC

## 2021-01-26 ENCOUNTER — Other Ambulatory Visit: Payer: Self-pay

## 2021-01-26 DIAGNOSIS — F988 Other specified behavioral and emotional disorders with onset usually occurring in childhood and adolescence: Secondary | ICD-10-CM

## 2021-01-26 MED ORDER — METHYLPHENIDATE HCL 20 MG PO TABS: 20.0000 mg | ORAL_TABLET | Freq: Three times a day (TID) | ORAL | 0 refills | Status: DC

## 2021-01-29 ENCOUNTER — Other Ambulatory Visit: Payer: Self-pay | Admitting: Family Medicine

## 2021-01-29 DIAGNOSIS — M5441 Lumbago with sciatica, right side: Secondary | ICD-10-CM

## 2021-02-21 ENCOUNTER — Other Ambulatory Visit: Payer: Self-pay | Admitting: Family Medicine

## 2021-02-21 DIAGNOSIS — F330 Major depressive disorder, recurrent, mild: Secondary | ICD-10-CM

## 2021-02-25 ENCOUNTER — Other Ambulatory Visit: Payer: Self-pay | Admitting: Family Medicine

## 2021-02-25 DIAGNOSIS — G43009 Migraine without aura, not intractable, without status migrainosus: Secondary | ICD-10-CM

## 2021-02-25 DIAGNOSIS — M797 Fibromyalgia: Secondary | ICD-10-CM

## 2021-02-25 DIAGNOSIS — F988 Other specified behavioral and emotional disorders with onset usually occurring in childhood and adolescence: Secondary | ICD-10-CM

## 2021-02-25 MED ORDER — METHYLPHENIDATE HCL 20 MG PO TABS: 20.0000 mg | ORAL_TABLET | Freq: Three times a day (TID) | ORAL | 0 refills | Status: DC

## 2021-03-17 ENCOUNTER — Ambulatory Visit (INDEPENDENT_AMBULATORY_CARE_PROVIDER_SITE_OTHER): Payer: BLUE CROSS/BLUE SHIELD

## 2021-03-17 ENCOUNTER — Ambulatory Visit (INDEPENDENT_AMBULATORY_CARE_PROVIDER_SITE_OTHER): Payer: BLUE CROSS/BLUE SHIELD | Admitting: Family Medicine

## 2021-03-17 ENCOUNTER — Encounter: Payer: Self-pay | Admitting: Family Medicine

## 2021-03-17 ENCOUNTER — Other Ambulatory Visit: Payer: Self-pay

## 2021-03-17 VITALS — BP 110/60 | HR 80 | Temp 97.4°F | Resp 16 | Ht 61.0 in | Wt 166.2 lb

## 2021-03-17 DIAGNOSIS — F988 Other specified behavioral and emotional disorders with onset usually occurring in childhood and adolescence: Secondary | ICD-10-CM | POA: Diagnosis not present

## 2021-03-17 DIAGNOSIS — Z23 Encounter for immunization: Secondary | ICD-10-CM

## 2021-03-17 DIAGNOSIS — M797 Fibromyalgia: Secondary | ICD-10-CM

## 2021-03-17 DIAGNOSIS — J301 Allergic rhinitis due to pollen: Secondary | ICD-10-CM | POA: Diagnosis not present

## 2021-03-17 DIAGNOSIS — F330 Major depressive disorder, recurrent, mild: Secondary | ICD-10-CM

## 2021-03-17 NOTE — Patient Instructions (Addendum)
B12 1000 mcg once daily. Vitamin D 2000 U daily. Calcium 1200-1500 mg calcium.

## 2021-03-17 NOTE — Progress Notes (Signed)
Subjective:  Patient ID: Anne Brown, female    DOB: 09-06-1970  Age: 51 y.o. MRN: [id number]  Chief Complaint  Patient presents with   Fibromyalgia   HPI Fibromyalgia: Lyrica 50 mg twice daily. On amitriptyline, meloxicam, and cyclobenzaprine. Working well.  ADD: Ritalin doing well.  Vertigo: meclizine and phenergan as needed for vertigo.  Allergic rhinitis: on zyrtec prn Migraines: Wellbutrin sr 150 mg one oral before bed. Amitripyline before bed. Fiorecet very infrequently.   Onychomycosis. ON lamisil. Current Outpatient Medications on File Prior to Visit  Medication Sig Dispense Refill   amitriptyline (ELAVIL) 100 MG tablet Take 1 tablet (100 mg total) by mouth at bedtime. 90 tablet 0   buPROPion (WELLBUTRIN SR) 150 MG 12 hr tablet TAKE 1 TABLET BY MOUTH AT BEDTIME AS NEEDED 90 tablet 3   butalbital-acetaminophen-caffeine (FIORICET) 50-325-40 MG tablet TAKE 1 TO 2 TABLETS BY MOUTH EVERY 6 HOURS AS NEEDED FOR HEADACHE 30 tablet 0   cetirizine (ZYRTEC) 10 MG tablet Take 10 mg by mouth at bedtime.     clonazePAM (KLONOPIN) 0.5 MG tablet Take 1 tablet by mouth twice daily as needed for anxiety 60 tablet 5   cyclobenzaprine (FLEXERIL) 5 MG tablet TAKE 1 TABLET BY MOUTH THREE TIMES DAILY AS NEEDED FOR  MUSCLE  SPASMS 270 tablet 1   meclizine (ANTIVERT) 25 MG tablet TAKE 1 TABLET BY MOUTH EVERY 6 HOURS AS NEEDED FOR DIZZINESS 30 tablet 0   meloxicam (MOBIC) 15 MG tablet Take 1 tablet by mouth once daily 90 tablet 0   methylphenidate (RITALIN) 20 MG tablet Take 1 tablet (20 mg total) by mouth 3 (three) times daily with meals. 90 tablet 0   pregabalin (LYRICA) 50 MG capsule TAKE 1 CAPSULE BY MOUTH THREE TIMES DAILY 270 capsule 1   promethazine (PHENERGAN) 12.5 MG tablet TAKE 1 TABLET BY MOUTH EVERY 6 HOURS AS NEEDED FOR NAUSEA 30 tablet 0   terbinafine (LAMISIL) 250 MG tablet Take 1 tablet (250 mg total) by mouth daily. 90 tablet 0   No current facility-administered medications on file  prior to visit.   Past Medical History:  Diagnosis Date   Abnormal Pap smear    ADHD    Anxiety    Family history of adverse reaction to anesthesia    mother n/v severe   Headache    migraines   Lower back pain    LSIL (low grade squamous intraepithelial lesion) on Pap smear 10/15/2007   Past Surgical History:  Procedure Laterality Date   ANTERIOR AND POSTERIOR REPAIR N/A 06/27/2018   Procedure: ANTERIOR (CYSTOCELE) AND POSTERIOR REPAIR (RECTOCELE);  Surgeon: Osborn Coho, MD;  Location: [facility];  Service: Gynecology;  Laterality: N/A;   DILATION AND CURETTAGE OF UTERUS  1992   DILATION AND CURETTAGE OF UTERUS  2009   ECTOPIC PREGNANCY SURGERY  01/2008   After BTL   HYSTEROSCOPY WITH NOVASURE N/A 06/27/2018   Procedure: HYSTEROSCOPY WITH NOVASURE;  Surgeon: Osborn Coho, MD;  Location: [facility];  Service: Gynecology;  Laterality: N/A;   TUBAL LIGATION      Family History  Problem Relation Age of Onset   Depression Mother    Social History   Socioeconomic History   Marital status: Married    Spouse name: Not on file   Number of children: Not on file   Years of education: Not on file   Highest education level: Not on file  Occupational History   Not on file  Tobacco Use   Smoking status: Never   Smokeless tobacco: Never  Vaping Use   Vaping Use: Never used  Substance and Sexual Activity   Alcohol use: Yes    Alcohol/week: 0.0 standard drinks    Comment: 3 glasses a week    Drug use: No   Sexual activity: Yes    Birth control/protection: None, Surgical    Comment: 1st intercourse- 17, partners-2  Other Topics Concern   Not on file  Social History Narrative   Not on file   Social Determinants of Health   Financial Resource Strain: Not on file  Food Insecurity: Not on file  Transportation Needs: Not on file  Physical Activity: Not on file  Stress: Not on file  Social Connections: Not on file    Review of Systems   Constitutional:  Negative for chills, fatigue and fever.  HENT:  Negative for congestion, rhinorrhea and sore throat.   Respiratory:  Negative for cough and shortness of breath.   Cardiovascular:  Negative for chest pain.  Gastrointestinal:  Negative for abdominal pain, constipation, diarrhea, nausea and vomiting.  Genitourinary:  Negative for dysuria and urgency.  Musculoskeletal:  Negative for back pain and myalgias.  Neurological:  Positive for light-headedness and headaches. Negative for dizziness and weakness.  Psychiatric/Behavioral:  Negative for dysphoric mood. The patient is not nervous/anxious.     Objective:  BP 110/60    Pulse 80    Temp (!) 97.4 F (36.3 C)    Resp 16    Ht 5\' 1"  (1.549 m)    Wt 166 lb 3.2 oz (75.4 kg)    BMI 31.40 kg/m   BP/Weight 03/17/2021 12/14/2020 10/25/2020  Systolic BP 110 126 126  Diastolic BP 60 80 76  Wt. (Lbs) 166.2 163 158.2  BMI 31.4 30.8 29.89    Physical Exam Vitals reviewed.  Constitutional:      Appearance: Normal appearance. She is obese.  HENT:     Right Ear: Tympanic membrane, ear canal and external ear normal.     Left Ear: Tympanic membrane, ear canal and external ear normal.     Nose: Congestion present.     Comments: pale    Mouth/Throat:     Pharynx: Oropharynx is clear.  Cardiovascular:     Rate and Rhythm: Normal rate and regular rhythm.     Heart sounds: Normal heart sounds. No murmur heard. Pulmonary:     Effort: Pulmonary effort is normal. No respiratory distress.     Breath sounds: Normal breath sounds.  Musculoskeletal:     Comments: Mild FM trigger points. Much better.  Lymphadenopathy:     Cervical: No cervical adenopathy.  Neurological:     Mental Status: She is alert and oriented to person, place, and time.  Psychiatric:        Mood and Affect: Mood normal.        Behavior: Behavior normal.    Diabetic Foot Exam - Simple   No data filed      Lab Results  Component Value Date   WBC 6.0  06/22/2020   HGB 13.6 06/22/2020   HCT 41.4 06/22/2020   PLT 323 06/22/2020   GLUCOSE 96 06/22/2020   CHOL 131 02/27/2014   TRIG 50 05/09/2012   HDL 71 05/09/2012   LDLCALC 52 05/09/2012   ALT 24 06/22/2020   AST 24 06/22/2020   NA 141 06/22/2020   K 4.2 06/22/2020   CL 101 06/22/2020   CREATININE 0.61 06/22/2020  BUN 8 06/22/2020   CO2 25 06/22/2020   TSH 2.790 09/30/2020   INR 0.9 06/22/2020      Assessment & Plan:   Problem List Items Addressed This Visit       Respiratory   Allergic rhinitis    Recommend start otc allergy medicine.         Other   Attention deficit disorder (ADD) without hyperactivity    The current medical regimen is effective;  continue present plan and medications.       Mild recurrent major depression (HCC)    The current medical regimen is effective;  continue present plan and medications.       Fibromyalgia - Primary    The current medical regimen is effective;  continue present plan and medications.       Follow-up: Return in about 6 months (around 09/14/2021) for cpe, chronic fasting.  An After Visit Summary was printed and given to the patient.  Blane Ohara, MD [facility] [phone]

## 2021-03-27 DIAGNOSIS — J309 Allergic rhinitis, unspecified: Secondary | ICD-10-CM | POA: Insufficient documentation

## 2021-03-27 NOTE — Assessment & Plan Note (Signed)
Recommend start otc allergy medicine.  ?

## 2021-03-27 NOTE — Assessment & Plan Note (Signed)
The current medical regimen is effective;  continue present plan and medications.  

## 2021-03-28 ENCOUNTER — Other Ambulatory Visit: Payer: Self-pay | Admitting: Family Medicine

## 2021-03-28 NOTE — Telephone Encounter (Signed)
Refill sent to pharmacy.   

## 2021-03-30 ENCOUNTER — Other Ambulatory Visit: Payer: Self-pay

## 2021-03-30 ENCOUNTER — Ambulatory Visit (INDEPENDENT_AMBULATORY_CARE_PROVIDER_SITE_OTHER): Payer: PRIVATE HEALTH INSURANCE | Admitting: Obstetrics and Gynecology

## 2021-03-30 ENCOUNTER — Encounter: Payer: Self-pay | Admitting: Obstetrics and Gynecology

## 2021-03-30 VITALS — BP 127/84 | HR 77 | Ht 61.0 in | Wt 163.5 lb

## 2021-03-30 DIAGNOSIS — F988 Other specified behavioral and emotional disorders with onset usually occurring in childhood and adolescence: Secondary | ICD-10-CM

## 2021-03-30 DIAGNOSIS — N393 Stress incontinence (female) (male): Secondary | ICD-10-CM

## 2021-03-30 DIAGNOSIS — R350 Frequency of micturition: Secondary | ICD-10-CM

## 2021-03-30 LAB — POCT URINALYSIS DIPSTICK
Appearance: NORMAL
Bilirubin, UA: NEGATIVE
Blood, UA: NEGATIVE
Glucose, UA: NEGATIVE
Ketones, UA: NEGATIVE
Leukocytes, UA: NEGATIVE
Nitrite, UA: NEGATIVE
Protein, UA: NEGATIVE
Spec Grav, UA: 1.02 (ref 1.010–1.025)
Urobilinogen, UA: 0.2 E.U./dL
pH, UA: 6.5 (ref 5.0–8.0)

## 2021-03-30 MED ORDER — METHYLPHENIDATE HCL 20 MG PO TABS: 20.0000 mg | ORAL_TABLET | Freq: Three times a day (TID) | ORAL | 0 refills | Status: DC

## 2021-03-30 NOTE — Progress Notes (Signed)
[facility] ?New Patient Evaluation and Consultation ? ?Referring Provider: Osborn Coho, MD ?PCP: Blane Ohara, MD ?Date of Service: 03/30/2021 ? ?SUBJECTIVE ?Chief Complaint: New Patient (Initial Visit) ? ?History of Present Illness: Anne Brown is a 51 y.o.  hispanic  female seen in consultation at the request of Dr. Su Hilt for evaluation of incontinence.   ? ?Review of records significant for: ?Having increased urinary urgency and frequency ? ?Urinary Symptoms: ?Leaks urine with cough/ sneeze, laughing, exercise, lifting, going from sitting to standing, without sensation, and without warning - but usually with walking. Does not really have urgency.  ?Leaks 2-4 time(s) per day.  ?Pad use: 2-4 liners/ mini-pads per day.   ?She is bothered by her UI symptoms. ? ?Day time voids 4-5.  Nocturia: 1 times per night to void. ?Voiding dysfunction: she empties her bladder well.  ?does not use a catheter to empty bladder.  ?When urinating, she feels she has no difficulties ?Drinks: 1 cup of coffee, plain water  ?Sometimes takes muscle relaxers and maybe thinks there is an association with the leakage. Not taking them often, only when the back pain is bad.  ? ?UTIs: 1-2 UTI's in the last year.  Sometimes has the feeling of urgency.  ?Denies history of blood in urine and kidney or bladder stones ? ?Pelvic Organ Prolapse Symptoms:                  ?She Admits to a feeling of a bulge the vaginal area. It has been present for 1 year.  ?She Denies seeing a bulge.  ?This bulge is bothersome. ?Had an anterior and posterior repair in 2020 ? ?Bowel Symptom: ?Bowel movements: 1-2 time(s) per day ?Stool consistency: hard ?Straining: yes, sometimes ?Splinting: no.  ?Incomplete evacuation: no.  ?She Denies accidental bowel leakage / fecal incontinence ?Bowel regimen: diet and fiber ?Last colonoscopy: Date- 2022, normal ? ?Sexual Function ?Sexually active: yes.  ?Pain with sex: No ? ?Pelvic Pain ?Denies pelvic  pain ? ? ?Past Medical History:  ?Past Medical History:  ?Diagnosis Date  ? Abnormal Pap smear   ? ADHD   ? Anxiety   ? Family history of adverse reaction to anesthesia   ? mother n/v severe  ? Headache   ? migraines  ? Lower back pain   ? LSIL (low grade squamous intraepithelial lesion) on Pap smear 10/15/2007  ? ? ? ?Past Surgical History:   ?Past Surgical History:  ?Procedure Laterality Date  ? ANTERIOR AND POSTERIOR REPAIR N/A 06/27/2018  ? Procedure: ANTERIOR (CYSTOCELE) AND POSTERIOR REPAIR (RECTOCELE);  Surgeon: Osborn Coho, MD;  Location: [facility];  Service: Gynecology;  Laterality: N/A;  ? DILATION AND CURETTAGE OF UTERUS  1992  ? DILATION AND CURETTAGE OF UTERUS  2009  ? ECTOPIC PREGNANCY SURGERY  01/2008  ? After BTL  ? HYSTEROSCOPY WITH NOVASURE N/A 06/27/2018  ? Procedure: HYSTEROSCOPY WITH NOVASURE;  Surgeon: Osborn Coho, MD;  Location: [facility];  Service: Gynecology;  Laterality: N/A;  ? TUBAL LIGATION    ? ? ? ?Past OB/GYN History: ?OB History  ?Gravida Para Term Preterm AB Living  ?6 3 3  0 3 3  ?SAB IAB Ectopic Multiple Live Births  ?2 0 1 0 3  ?  ?# Outcome Date GA Lbr Len/2nd Weight Sex Delivery Anes PTL Lv  ?6 Ectopic           ?5 SAB           ?4  SAB           ?3 Term     M    LIV  ?2 Term     M Vag-Spont   LIV  ?1 Term     F Vag-Spont   LIV  ? ? ?Contraception: tubal ligation. ?Last pap smear was 06/2020- negative.   ?S/p Novasure ablation ? ?Medications: She has a current medication list which includes the following prescription(s): amitriptyline, bupropion, butalbital-acetaminophen-caffeine, cetirizine, clonazepam, cyclobenzaprine, meclizine, meloxicam, methylphenidate, pregabalin, promethazine, and terbinafine.  ? ?Allergies: Patient has No Known Allergies.  ? ?Social History:  ?Social History  ? ?Tobacco Use  ? Smoking status: Never  ? Smokeless tobacco: Never  ?Vaping Use  ? Vaping Use: Never used  ?Substance Use Topics  ? Alcohol use: Yes  ?   Alcohol/week: 0.0 standard drinks  ?  Comment: 3 glasses a week   ? Drug use: No  ? ? ?Relationship status: married ?She lives with husband.   ?She is not employed . ?Regular exercise: Yes: 2-4 times per week ?History of abuse: No ? ?Family History:   ?Family History  ?Problem Relation Age of Onset  ? Depression Mother   ? ? ? ?Review of Systems: Review of Systems  ?Constitutional:  Negative for fever, malaise/fatigue and weight loss.  ?Respiratory:  Negative for cough, shortness of breath and wheezing.   ?Cardiovascular:  Negative for chest pain, palpitations and leg swelling.  ?Gastrointestinal:  Negative for abdominal pain and blood in stool.  ?Genitourinary:  Negative for dysuria.  ?Musculoskeletal:  Positive for myalgias.  ?Skin:  Negative for rash.  ?Neurological:  Positive for dizziness and headaches.  ?Endo/Heme/Allergies:  Bruises/bleeds easily.  ?Psychiatric/Behavioral:  Negative for depression. The patient is nervous/anxious.   ? ? ?OBJECTIVE ?Physical Exam: ?Vitals:  ? 03/30/21 1351  ?BP: 127/84  ?Pulse: 77  ?Weight: 163 lb 8 oz (74.2 kg)  ?Height: 5\' 1"  (1.549 m)  ? ? ?Physical Exam ?Constitutional:   ?   General: She is not in acute distress. ?Pulmonary:  ?   Effort: Pulmonary effort is normal.  ?Abdominal:  ?   General: There is no distension.  ?   Palpations: Abdomen is soft.  ?   Tenderness: There is no abdominal tenderness. There is no rebound.  ?Musculoskeletal:     ?   General: No swelling. Normal range of motion.  ?Skin: ?   General: Skin is warm and dry.  ?   Findings: No rash.  ?Neurological:  ?   Mental Status: She is alert and oriented to person, place, and time.  ?Psychiatric:     ?   Mood and Affect: Mood normal.     ?   Behavior: Behavior normal.  ? ? ? ?GU / Detailed Urogynecologic Evaluation:  ?Pelvic Exam: Normal external female genitalia; Bartholin's and Skene's glands normal in appearance; urethral meatus normal in appearance, no urethral masses or discharge.  ? ?CST:  negative ? ?Speculum exam reveals normal vaginal mucosa without atrophy. Cervix normal appearance. Uterus normal single, nontender. Adnexa no mass, fullness, tenderness.   ? ? ?Pelvic floor strength II/V ? ?Pelvic floor musculature: Right levator tender, Right obturator tender, Left levator non-tender, Left obturator non-tender ? ?POP-Q:  ? ?POP-Q ? ?-2  ?  Aa   ?-2 ?                                          Ba  ?-6.5  ?                                            C  ? ?3  ?                                          Gh  ?5.5  ?                                          Pb  ?9.5  ?                                          tvl  ? ?-2  ?                                          Ap  ?-2  ?                                          Bp  ?-9  ?                                            D  ? ? ? ?Rectal Exam:  ?Normal external rectum ? ?Post-Void Residual (PVR) by Bladder Scan: ?In order to evaluate bladder emptying, we discussed obtaining a postvoid residual and she agreed to this procedure. ? ?Procedure: The ultrasound unit was placed on the patient's abdomen in the suprapubic region after the patient had voided. A PVR of 22 ml was obtained by bladder scan. ? ?Laboratory Results: ?POC urine: negative ? ? ?ASSESSMENT AND PLAN ?Ms. Anne Brown is a 51 y.o. with:  ?1. SUI (stress urinary incontinence, female)   ?2. Urinary frequency   ? ?- Appears to have worsening stress incontinence. May have a component of overactive bladder.  ?- Will further evaluate leakage with urodynamic testing.  ?- For treatment of stress urinary incontinence,  non-surgical options include expectant management, weight loss, physical therapy, as well as a pessary.  Surgical options include a midurethral sling,  and transurethral injection of a bulking agent. She is most interested in urethral bulking, handout provided.  ? ?Return for urodynamics ? ? ?Marguerita BeardsMichelle N Caelan Atchley, MD ? ? ? ? ? ?

## 2021-04-05 ENCOUNTER — Other Ambulatory Visit: Payer: Self-pay | Admitting: Family Medicine

## 2021-04-05 DIAGNOSIS — G43009 Migraine without aura, not intractable, without status migrainosus: Secondary | ICD-10-CM

## 2021-04-14 ENCOUNTER — Other Ambulatory Visit: Payer: Self-pay | Admitting: Family Medicine

## 2021-04-14 DIAGNOSIS — G43009 Migraine without aura, not intractable, without status migrainosus: Secondary | ICD-10-CM

## 2021-04-27 ENCOUNTER — Ambulatory Visit (INDEPENDENT_AMBULATORY_CARE_PROVIDER_SITE_OTHER): Payer: PRIVATE HEALTH INSURANCE | Admitting: Obstetrics and Gynecology

## 2021-04-27 ENCOUNTER — Encounter: Payer: Self-pay | Admitting: Obstetrics and Gynecology

## 2021-04-27 ENCOUNTER — Other Ambulatory Visit (HOSPITAL_COMMUNITY)
Admission: RE | Admit: 2021-04-27 | Discharge: 2021-04-27 | Disposition: A | Discharge status: Home / Self Care | Payer: No Typology Code available for payment source | Source: Ambulatory Visit | Attending: Obstetrics and Gynecology | Admitting: Obstetrics and Gynecology

## 2021-04-27 VITALS — BP 130/85 | HR 77

## 2021-04-27 DIAGNOSIS — R350 Frequency of micturition: Secondary | ICD-10-CM | POA: Insufficient documentation

## 2021-04-27 DIAGNOSIS — R82998 Other abnormal findings in urine: Secondary | ICD-10-CM

## 2021-04-27 LAB — POCT URINALYSIS DIPSTICK
Appearance: ABNORMAL
Bilirubin, UA: NEGATIVE
Glucose, UA: NEGATIVE
Ketones, UA: NEGATIVE
Nitrite, UA: NEGATIVE
Protein, UA: NEGATIVE
Spec Grav, UA: 1.015 (ref 1.010–1.025)
Urobilinogen, UA: 0.2 E.U./dL
pH, UA: 7.5 (ref 5.0–8.0)

## 2021-04-27 MED ORDER — NITROFURANTOIN MONOHYD MACRO 100 MG PO CAPS
100.0000 mg | ORAL_CAPSULE | Freq: Two times a day (BID) | ORAL | 0 refills | Status: AC
Start: 2021-04-27 — End: 2021-05-02

## 2021-04-27 NOTE — Progress Notes (Signed)
[facility] ?Return Visit ? ?SUBJECTIVE  ?History of Present Illness: ?Anne Brown is a 51 y.o. female seen in follow-up for stress incontinence.  ? ?Plan today was to do a simple CMG to assess leakage. Has some burning and urgency starting this morning. Took one dose of macrobid.  ? ?Past Medical History: ?Patient  has a past medical history of Abnormal Pap smear, ADHD, Anxiety, Family history of adverse reaction to anesthesia, Headache, Lower back pain, and LSIL (low grade squamous intraepithelial lesion) on Pap smear (10/15/2007).  ? ?Past Surgical History: ?She  has a past surgical history that includes Ectopic pregnancy surgery (01/2008); Tubal ligation; Dilation and curettage of uterus (1992); Dilation and curettage of uterus (2009); Anterior and posterior repair (N/A, 06/27/2018); and Hysteroscopy with novasure (N/A, 06/27/2018).  ? ?Medications: ?She has a current medication list which includes the following prescription(s): amitriptyline, bupropion, butalbital-acetaminophen-caffeine, cetirizine, clonazepam, cyclobenzaprine, meclizine, meloxicam, methylphenidate, nitrofurantoin (macrocrystal-monohydrate), pregabalin, promethazine, and terbinafine.  ? ?Allergies: ?Patient has No Known Allergies.  ? ?Social History: ?Patient  reports that she has never smoked. She has never used smokeless tobacco. She reports current alcohol use. She reports that she does not use drugs.  ?  ?  ?OBJECTIVE  ?  ? ?Physical Exam: ?Vitals:  ? 04/27/21 1405  ?BP: 130/85  ?Pulse: 77  ? ?Gen: No apparent distress, A&O x 3. ? ?Detailed Urogynecologic Evaluation:  ?Deferred. Prior exam showed: ? ?POP-Q:  ?  ?POP-Q ?  ?-2  ?                                          Aa   ?-2 ?                                          Ba   ?-6.5  ?                                            C  ?  ?3  ?                                          Gh   ?5.5  ?                                          Pb   ?9.5  ?                                           tvl  ?  ?-2  ?                                          Ap   ?-2  ?  Bp   ?-9  ?                                            D  ?  ?   ? ?ASSESSMENT AND PLAN  ?  ?Ms. Imbert is a 51 y.o. with:  ?1. Urinary frequency   ?2. Leukocytes in urine   ? ?- POC urine today shows small leukocytes. She has already taken a dose of antibiotic but will send for urine culture. Will send macrobid 100 BID x 5 days to pharmacy.  ?- Will defer simple CMG test until symptoms have improved.  ? ?Marguerita Beards, MD ? ? ? ? ?

## 2021-04-28 ENCOUNTER — Encounter: Payer: Self-pay | Admitting: Family Medicine

## 2021-04-28 ENCOUNTER — Ambulatory Visit (INDEPENDENT_AMBULATORY_CARE_PROVIDER_SITE_OTHER): Payer: PRIVATE HEALTH INSURANCE | Admitting: Family Medicine

## 2021-04-28 VITALS — BP 124/72 | HR 90 | Temp 98.0°F | Resp 18 | Ht 61.0 in | Wt 167.2 lb

## 2021-04-28 DIAGNOSIS — R079 Chest pain, unspecified: Secondary | ICD-10-CM | POA: Diagnosis not present

## 2021-04-28 DIAGNOSIS — R42 Dizziness and giddiness: Secondary | ICD-10-CM | POA: Diagnosis not present

## 2021-04-28 LAB — URINE CULTURE: Culture: NO GROWTH

## 2021-04-28 NOTE — Progress Notes (Signed)
? ?Acute Office Visit ? ?Subjective:  ? ? Patient ID: Anne Brown, female    DOB: 1970/09/06, 51 y.o.   MRN: [id number] ? ?Chief Complaint  ?Patient presents with  ? Dizziness 3-4 weeks  ? ? ?HPI ?Patient is in today for dizziness that started 3-4 weeks ago. It happens sporadically.  ?BPV exercises did not help.  ?When she moves her head to the right, light headedness. Lasts for seconds.  ?Having headaches different from migraines, but seem like allergies.  ?No palpitations. Some chest pressure that occurs randomly. Thought it might have been anxiety. Took clonazepam and resolved in about 15 min. 5/10.  ? ?Past Medical History:  ?Diagnosis Date  ? Abnormal Pap smear   ? ADHD   ? Anxiety   ? Family history of adverse reaction to anesthesia   ? mother n/v severe  ? Headache   ? migraines  ? Lower back pain   ? LSIL (low grade squamous intraepithelial lesion) on Pap smear 10/15/2007  ? ? ?Past Surgical History:  ?Procedure Laterality Date  ? ANTERIOR AND POSTERIOR REPAIR N/A 06/27/2018  ? Procedure: ANTERIOR (CYSTOCELE) AND POSTERIOR REPAIR (RECTOCELE);  Surgeon: Everett Graff, MD;  Location: [facility];  Service: Gynecology;  Laterality: N/A;  ? [place] OF UTERUS  1992  ? DILATION AND CURETTAGE OF UTERUS  2009  ? ECTOPIC PREGNANCY SURGERY  01/2008  ? After BTL  ? HYSTEROSCOPY WITH NOVASURE N/A 06/27/2018  ? Procedure: HYSTEROSCOPY WITH NOVASURE;  Surgeon: Everett Graff, MD;  Location: [facility];  Service: Gynecology;  Laterality: N/A;  ? TUBAL LIGATION    ? ? ?Family History  ?Problem Relation Age of Onset  ? Depression Mother   ? ? ?Social History  ? ?Socioeconomic History  ? Marital status: Married  ?  Spouse name: Not on file  ? Number of children: Not on file  ? Years of education: Not on file  ? Highest education level: Not on file  ?Occupational History  ? Not on file  ?Tobacco Use  ? Smoking status: Never  ? Smokeless tobacco: Never  ?Vaping Use  ? Vaping Use: Never  used  ?Substance and Sexual Activity  ? Alcohol use: Yes  ?  Alcohol/week: 0.0 standard drinks  ?  Comment: 3 glasses a week   ? Drug use: No  ? Sexual activity: Yes  ?  Birth control/protection: None, Surgical  ?  Comment: 1st intercourse- 4, partners-2  ?Other Topics Concern  ? Not on file  ?Social History Narrative  ? Not on file  ? ?Social Determinants of Health  ? ?Financial Resource Strain: Not on file  ?Food Insecurity: Not on file  ?Transportation Needs: Not on file  ?Physical Activity: Not on file  ?Stress: Not on file  ?Social Connections: Not on file  ?Intimate Partner Violence: Not on file  ? ? ?Outpatient Medications Prior to Visit  ?Medication Sig Dispense Refill  ? amitriptyline (ELAVIL) 100 MG tablet Take 1 tablet (100 mg total) by mouth at bedtime. 90 tablet 0  ? buPROPion (WELLBUTRIN SR) 150 MG 12 hr tablet TAKE 1 TABLET BY MOUTH AT BEDTIME AS NEEDED 90 tablet 3  ? butalbital-acetaminophen-caffeine (FIORICET) 50-325-40 MG tablet TAKE 1 TO 2 TABLETS BY MOUTH EVERY 6 HOURS AS NEEDED FOR HEADACHE 30 tablet 0  ? cetirizine (ZYRTEC) 10 MG tablet Take 10 mg by mouth at bedtime.    ? clonazePAM (KLONOPIN) 0.5 MG tablet Take 1 tablet by mouth twice  daily as needed for anxiety 60 tablet 5  ? cyclobenzaprine (FLEXERIL) 5 MG tablet TAKE 1 TABLET BY MOUTH THREE TIMES DAILY AS NEEDED FOR  MUSCLE  SPASMS 270 tablet 1  ? meclizine (ANTIVERT) 25 MG tablet TAKE 1 TABLET BY MOUTH EVERY 6 HOURS AS NEEDED FOR DIZZINESS 30 tablet 0  ? meloxicam (MOBIC) 15 MG tablet Take 1 tablet by mouth once daily 90 tablet 0  ? methylphenidate (RITALIN) 20 MG tablet Take 1 tablet (20 mg total) by mouth 3 (three) times daily with meals. 90 tablet 0  ? nitrofurantoin, macrocrystal-monohydrate, (MACROBID) 100 MG capsule Take 1 capsule (100 mg total) by mouth 2 (two) times daily for 5 days. 10 capsule 0  ? pregabalin (LYRICA) 50 MG capsule TAKE 1 CAPSULE BY MOUTH THREE TIMES DAILY 270 capsule 1  ? promethazine (PHENERGAN) 12.5 MG tablet  TAKE 1 TABLET BY MOUTH EVERY 6 HOURS AS NEEDED FOR NAUSEA 30 tablet 0  ? terbinafine (LAMISIL) 250 MG tablet Take 1 tablet by mouth once daily 90 tablet 0  ? ?No facility-administered medications prior to visit.  ? ? ?No Known Allergies ? ?Review of Systems  ?Constitutional:  Negative for appetite change, fatigue and fever.  ?HENT:  Positive for congestion (off/on for weeks) and sneezing. Negative for ear pain, sinus pressure and sore throat.   ?Respiratory:  Positive for chest tightness and shortness of breath. Negative for cough and wheezing.   ?Cardiovascular:  Negative for chest pain and palpitations.  ?Gastrointestinal:  Negative for abdominal pain, constipation, diarrhea, nausea and vomiting.  ?Genitourinary:  Negative for dysuria and hematuria.  ?Musculoskeletal:  Positive for back pain (lower). Negative for arthralgias, joint swelling and myalgias.  ?Skin:  Negative for rash.  ?Neurological:  Positive for headaches. Negative for dizziness and weakness.  ?Psychiatric/Behavioral:  Negative for dysphoric mood. The patient is not nervous/anxious.   ? ?   ?Objective:  ?  ?Physical Exam ?Vitals reviewed.  ?Constitutional:   ?   Appearance: Normal appearance. She is normal weight.  ?Neck:  ?   Vascular: No carotid bruit.  ?Cardiovascular:  ?   Rate and Rhythm: Normal rate and regular rhythm.  ?   Heart sounds: Normal heart sounds.  ?Pulmonary:  ?   Effort: Pulmonary effort is normal. No respiratory distress.  ?   Breath sounds: Normal breath sounds.  ?Neurological:  ?   Mental Status: She is alert and oriented to person, place, and time.  ?   Comments: Positive dix hallpike maneuver   ?Psychiatric:     ?   Mood and Affect: Mood normal.     ?   Behavior: Behavior normal.  ? ? ?BP 124/72   Pulse 90   Temp 98 ?F (36.7 ?C)   Resp 18   Ht $R'5\' 1"'Mp$  (1.549 m)   Wt 167 lb 3.2 oz (75.8 kg)   SpO2 95%   BMI 31.59 kg/m?  ?Wt Readings from Last 3 Encounters:  ?04/28/21 167 lb 3.2 oz (75.8 kg)  ?03/30/21 163 lb 8 oz (74.2  kg)  ?03/17/21 166 lb 3.2 oz (75.4 kg)  ? ? ?Health Maintenance Due  ?Topic Date Due  ? TETANUS/TDAP  Never done  ? COLONOSCOPY (Pts 45-29yrs Insurance coverage will need to be confirmed)  Never done  ? PAP SMEAR-Modifier  02/27/2017  ? ? ?There are no preventive care reminders to display for this patient. ? ? ?Lab Results  ?Component Value Date  ? TSH 2.790 09/30/2020  ? ?Lab Results  ?  Component Value Date  ? WBC 6.0 06/22/2020  ? HGB 13.6 06/22/2020  ? HCT 41.4 06/22/2020  ? MCV 96 06/22/2020  ? PLT 323 06/22/2020  ? ?Lab Results  ?Component Value Date  ? NA 141 06/22/2020  ? K 4.2 06/22/2020  ? CO2 25 06/22/2020  ? GLUCOSE 96 06/22/2020  ? BUN 8 06/22/2020  ? CREATININE 0.61 06/22/2020  ? BILITOT 0.3 06/22/2020  ? ALKPHOS 102 06/22/2020  ? AST 24 06/22/2020  ? ALT 24 06/22/2020  ? PROT 7.0 06/22/2020  ? ALBUMIN 4.5 06/22/2020  ? CALCIUM 9.5 06/22/2020  ? ANIONGAP 7 06/24/2018  ? EGFR 110 06/22/2020  ? ?Lab Results  ?Component Value Date  ? CHOL 131 02/27/2014  ? ?Lab Results  ?Component Value Date  ? HDL 71 05/09/2012  ? ?Lab Results  ?Component Value Date  ? [place] 52 05/09/2012  ? ?Lab Results  ?Component Value Date  ? TRIG 50 05/09/2012  ? ?Lab Results  ?Component Value Date  ? CHOLHDL 1.9 05/09/2012  ? ?No results found for: HGBA1C ? ? ? ?   ?Assessment & Plan:  ? ?Problem List Items Addressed This Visit   ? ?  ? Other  ? Dizziness - Primary  ?  I believe this is due to BPPV still. Recommend some different exercises.  ?Check labs.  ?  ?  ? Relevant Orders  ? EKG 12-Lead (Completed)  ? CBC with Differential/Platelet  ? Comprehensive metabolic panel  ? TSH  ? Chest pain at rest  ?  EKG: NSR NO ST CHANGES. ?  ?  ? Relevant Orders  ? EKG 12-Lead (Completed)  ? ?Follow up: as needed  ? ?Rochel Brome, MD ?

## 2021-05-03 DIAGNOSIS — R42 Dizziness and giddiness: Secondary | ICD-10-CM | POA: Insufficient documentation

## 2021-05-03 DIAGNOSIS — R079 Chest pain, unspecified: Secondary | ICD-10-CM | POA: Insufficient documentation

## 2021-05-03 NOTE — Assessment & Plan Note (Signed)
EKG: NSR NO ST CHANGES. ?

## 2021-05-03 NOTE — Assessment & Plan Note (Signed)
I believe this is due to BPPV still. Recommend some different exercises.  ?Check labs.  ?

## 2021-05-06 ENCOUNTER — Other Ambulatory Visit: Payer: Self-pay

## 2021-05-06 DIAGNOSIS — F988 Other specified behavioral and emotional disorders with onset usually occurring in childhood and adolescence: Secondary | ICD-10-CM

## 2021-05-06 MED ORDER — METHYLPHENIDATE HCL 20 MG PO TABS: 20.0000 mg | ORAL_TABLET | Freq: Three times a day (TID) | ORAL | 0 refills | Status: DC

## 2021-05-10 ENCOUNTER — Other Ambulatory Visit: Payer: Self-pay | Admitting: Family Medicine

## 2021-05-10 DIAGNOSIS — M545 Low back pain, unspecified: Secondary | ICD-10-CM

## 2021-05-10 DIAGNOSIS — M25551 Pain in right hip: Secondary | ICD-10-CM

## 2021-05-10 NOTE — Telephone Encounter (Signed)
Refill sent to pharmacy.   

## 2021-05-11 ENCOUNTER — Encounter: Payer: Self-pay | Admitting: Family Medicine

## 2021-05-19 ENCOUNTER — Ambulatory Visit: Payer: PRIVATE HEALTH INSURANCE | Admitting: Obstetrics and Gynecology

## 2021-05-23 ENCOUNTER — Other Ambulatory Visit: Payer: Self-pay | Admitting: Family Medicine

## 2021-06-14 ENCOUNTER — Other Ambulatory Visit: Payer: Self-pay

## 2021-06-14 DIAGNOSIS — F988 Other specified behavioral and emotional disorders with onset usually occurring in childhood and adolescence: Secondary | ICD-10-CM

## 2021-06-14 MED ORDER — METHYLPHENIDATE HCL 20 MG PO TABS: 20.0000 mg | ORAL_TABLET | Freq: Three times a day (TID) | ORAL | 0 refills | Status: DC

## 2021-06-15 ENCOUNTER — Other Ambulatory Visit: Payer: Self-pay | Admitting: Family Medicine

## 2021-06-15 DIAGNOSIS — M25551 Pain in right hip: Secondary | ICD-10-CM

## 2021-06-15 DIAGNOSIS — M545 Low back pain, unspecified: Secondary | ICD-10-CM

## 2021-06-16 ENCOUNTER — Ambulatory Visit (INDEPENDENT_AMBULATORY_CARE_PROVIDER_SITE_OTHER): Payer: PRIVATE HEALTH INSURANCE | Admitting: Obstetrics and Gynecology

## 2021-06-16 ENCOUNTER — Encounter: Payer: Self-pay | Admitting: Obstetrics and Gynecology

## 2021-06-16 VITALS — BP 133/84 | HR 98

## 2021-06-16 DIAGNOSIS — N393 Stress incontinence (female) (male): Secondary | ICD-10-CM

## 2021-06-16 NOTE — Progress Notes (Signed)
[facility] Return Visit  SUBJECTIVE  History of Present Illness: Anne Brown is a 51 y.o. female seen in follow-up for stress urinary incontinence.  Past Medical History: Patient  has a past medical history of Abnormal Pap smear, ADHD, Anxiety, Family history of adverse reaction to anesthesia, Headache, Lower back pain, and LSIL (low grade squamous intraepithelial lesion) on Pap smear (10/15/2007).   Past Surgical History: She  has a past surgical history that includes Ectopic pregnancy surgery (01/2008); Tubal ligation; Dilation and curettage of uterus (1992); Dilation and curettage of uterus (2009); Anterior and posterior repair (N/A, 06/27/2018); and Hysteroscopy with novasure (N/A, 06/27/2018).   Medications: She has a current medication list which includes the following prescription(s): amitriptyline, bupropion, butalbital-acetaminophen-caffeine, cetirizine, clonazepam, cyclobenzaprine, meclizine, meloxicam, meloxicam, methylphenidate, pregabalin, promethazine, and terbinafine.   Allergies: Patient has No Known Allergies.   Social History: Patient  reports that she has never smoked. She has never used smokeless tobacco. She reports current alcohol use. She reports that she does not use drugs.      OBJECTIVE     Physical Exam: Vitals:   06/16/21 1400  BP: 133/84  Pulse: 98   Gen: No apparent distress, A&O x 3.  Detailed Urogynecologic Evaluation:  Deferred. Prior exam showed: POP-Q:    POP-Q   -2                                            Aa   -2                                           Ba   -6.5                                              C    3                                            Gh   5.5                                            Pb   9.5                                            tvl    -2                                            Ap   -2  Bp   -9                                               D      Verbal consent was obtained to perform simple CMG procedure:   Prolapse was reduced using 2 large cotton swabs. Urethra was prepped with betadine and a 72F catheter was placed and bladder was drained completely. The bladder was then backfilled with sterile water by gravity.  First sensation: First Desire: Strong Desire: Capacity: Cough stress test was positive. Valsalva stress test was not done.  Catheter was replaced to empty bladder.   Interpretation: CMG showed normal sensation, and normal cystometric capacity. Findings positive for stress incontinence, negative for detrusor overactivity.     ASSESSMENT AND PLAN    [name] is a 51 y.o. with:  1. SUI (stress urinary incontinence, female)    - We again reviewed surgical options for SUI including sling and urethral bulking.  - She would like to proceed with Urethral bulking in the OR. Risks of the procedure discussed.  - Has some urinary urgency at baseline and we reviewed that this procedure will not treat urgency so she may need further treatment after the procedure.   Will send request for surgery scheduling.   Marguerita Beards, MD   Time spent: I spent 20 minutes dedicated to the care of this patient on the date of this encounter to include pre-visit review of records, face-to-face time with the patient and post visit documentation. Additional time was spent on the procedure.

## 2021-06-29 ENCOUNTER — Encounter (HOSPITAL_BASED_OUTPATIENT_CLINIC_OR_DEPARTMENT_OTHER): Payer: Self-pay | Admitting: *Deleted

## 2021-07-13 ENCOUNTER — Other Ambulatory Visit: Payer: Self-pay | Admitting: Family Medicine

## 2021-07-13 DIAGNOSIS — M797 Fibromyalgia: Secondary | ICD-10-CM

## 2021-07-20 ENCOUNTER — Other Ambulatory Visit: Payer: Self-pay

## 2021-07-20 DIAGNOSIS — F988 Other specified behavioral and emotional disorders with onset usually occurring in childhood and adolescence: Secondary | ICD-10-CM

## 2021-07-20 MED ORDER — METHYLPHENIDATE HCL 20 MG PO TABS: 20.0000 mg | ORAL_TABLET | Freq: Three times a day (TID) | ORAL | 0 refills | Status: DC

## 2021-07-29 ENCOUNTER — Ambulatory Visit (INDEPENDENT_AMBULATORY_CARE_PROVIDER_SITE_OTHER): Payer: No Typology Code available for payment source | Admitting: Family Medicine

## 2021-07-29 VITALS — BP 134/78 | HR 100 | Temp 97.4°F | Resp 16 | Ht 61.0 in | Wt 167.0 lb

## 2021-07-29 DIAGNOSIS — E559 Vitamin D deficiency, unspecified: Secondary | ICD-10-CM | POA: Diagnosis not present

## 2021-07-29 DIAGNOSIS — M797 Fibromyalgia: Secondary | ICD-10-CM

## 2021-07-29 DIAGNOSIS — R6889 Other general symptoms and signs: Secondary | ICD-10-CM

## 2021-07-29 DIAGNOSIS — E538 Deficiency of other specified B group vitamins: Secondary | ICD-10-CM

## 2021-07-29 DIAGNOSIS — R5383 Other fatigue: Secondary | ICD-10-CM | POA: Diagnosis not present

## 2021-07-29 NOTE — Progress Notes (Unsigned)
Acute Office Visit  Subjective:    Patient ID: Anne Brown, female    DOB: 1970/11/21, 51 y.o.   MRN: [id number]  No chief complaint on file.   HPI: Patient is in today for myalgias and fatigue x 1 1/2 to 2 weeks.  She reports that her symptoms started about  days ago and seem to be worsening.  Sleeping 7-8 hours per night. Covid negative 5-6 days ago. Focus is poor.   Past Medical History:  Diagnosis Date   Abnormal Pap smear    ADHD    Anxiety    Family history of adverse reaction to anesthesia    mother n/v severe   Headache    migraines   Lower back pain    LSIL (low grade squamous intraepithelial lesion) on Pap smear 10/15/2007    Past Surgical History:  Procedure Laterality Date   ANTERIOR AND POSTERIOR REPAIR N/A 06/27/2018   Procedure: ANTERIOR (CYSTOCELE) AND POSTERIOR REPAIR (RECTOCELE);  Surgeon: Everett Graff, MD;  Location: [facility];  Service: Gynecology;  Laterality: N/A;   DILATION AND CURETTAGE OF UTERUS  1992   DILATION AND CURETTAGE OF UTERUS  2009   ECTOPIC PREGNANCY SURGERY  01/2008   After BTL   HYSTEROSCOPY WITH NOVASURE N/A 06/27/2018   Procedure: HYSTEROSCOPY WITH NOVASURE;  Surgeon: Everett Graff, MD;  Location: [facility];  Service: Gynecology;  Laterality: N/A;   TUBAL LIGATION      Family History  Problem Relation Age of Onset   Depression Mother     Social History   Socioeconomic History   Marital status: Married    Spouse name: Not on file   Number of children: Not on file   Years of education: Not on file   Highest education level: Not on file  Occupational History   Not on file  Tobacco Use   Smoking status: Never   Smokeless tobacco: Never  Vaping Use   Vaping Use: Never used  Substance and Sexual Activity   Alcohol use: Yes    Alcohol/week: 0.0 standard drinks of alcohol    Comment: 3 glasses a week    Drug use: No   Sexual activity: Yes    Birth control/protection: None, Surgical     Comment: 1st intercourse- 17, partners-2  Other Topics Concern   Not on file  Social History Narrative   Not on file   Social Determinants of Health   Financial Resource Strain: Not on file  Food Insecurity: Not on file  Transportation Needs: Not on file  Physical Activity: Not on file  Stress: Not on file  Social Connections: Not on file  Intimate Partner Violence: Not on file    Outpatient Medications Prior to Visit  Medication Sig Dispense Refill   amitriptyline (ELAVIL) 100 MG tablet TAKE 1 TABLET BY MOUTH ONCE DAILY AT NIGHT 90 tablet 0   buPROPion (WELLBUTRIN SR) 150 MG 12 hr tablet TAKE 1 TABLET BY MOUTH AT BEDTIME AS NEEDED 90 tablet 3   butalbital-acetaminophen-caffeine (FIORICET) 50-325-40 MG tablet TAKE 1 TO 2 TABLETS BY MOUTH EVERY 6 HOURS AS NEEDED FOR HEADACHE 30 tablet 0   cetirizine (ZYRTEC) 10 MG tablet Take 10 mg by mouth at bedtime.     clonazePAM (KLONOPIN) 0.5 MG tablet Take 1 tablet by mouth twice daily as needed for anxiety 60 tablet 5   cyclobenzaprine (FLEXERIL) 5 MG tablet TAKE 1 TABLET BY MOUTH THREE TIMES DAILY AS NEEDED FOR  MUSCLE  SPASMS  270 tablet 1   meclizine (ANTIVERT) 25 MG tablet TAKE 1 TABLET BY MOUTH EVERY 6 HOURS AS NEEDED FOR DIZZINESS 30 tablet 0   meloxicam (MOBIC) 15 MG tablet Take 1 tablet by mouth once daily 90 tablet 1   meloxicam (MOBIC) 15 MG tablet Take 1 tablet by mouth once daily 90 tablet 0   methylphenidate (RITALIN) 20 MG tablet Take 1 tablet (20 mg total) by mouth 3 (three) times daily with meals. 90 tablet 0   pregabalin (LYRICA) 50 MG capsule TAKE 1 CAPSULE BY MOUTH THREE TIMES DAILY 90 capsule 0   promethazine (PHENERGAN) 12.5 MG tablet TAKE 1 TABLET BY MOUTH EVERY 6 HOURS AS NEEDED FOR NAUSEA 30 tablet 0   terbinafine (LAMISIL) 250 MG tablet Take 1 tablet by mouth once daily 90 tablet 0   No facility-administered medications prior to visit.    No Known Allergies  Review of Systems  Constitutional:  Positive for  fatigue. Negative for chills and fever.  Cardiovascular:  Positive for leg swelling.  Musculoskeletal:  Positive for arthralgias (left elbow) and myalgias.  Neurological:  Positive for weakness and headaches. Negative for dizziness.  Psychiatric/Behavioral:  Negative for dysphoric mood. The patient is not nervous/anxious.        Objective:    Physical Exam  BP 134/78   Pulse 100   Temp (!) 97.4 F (36.3 C)   Resp 16   Ht $R'5\' 1"'xD$  (1.549 m)   Wt 167 lb (75.8 kg)   BMI 31.55 kg/m  Wt Readings from Last 3 Encounters:  07/29/21 167 lb (75.8 kg)  04/28/21 167 lb 3.2 oz (75.8 kg)  03/30/21 163 lb 8 oz (74.2 kg)    Health Maintenance Due  Topic Date Due   TETANUS/TDAP  Never done   COLONOSCOPY (Pts 45-54yrs Insurance coverage will need to be confirmed)  Never done   PAP SMEAR-Modifier  02/27/2017   MAMMOGRAM  05/14/2021   Zoster Vaccines- Shingrix (1 of 2) Never done    There are no preventive care reminders to display for this patient.   Lab Results  Component Value Date   TSH 2.790 09/30/2020   Lab Results  Component Value Date   WBC 6.0 06/22/2020   HGB 13.6 06/22/2020   HCT 41.4 06/22/2020   MCV 96 06/22/2020   PLT 323 06/22/2020   Lab Results  Component Value Date   NA 141 06/22/2020   K 4.2 06/22/2020   CO2 25 06/22/2020   GLUCOSE 96 06/22/2020   BUN 8 06/22/2020   CREATININE 0.61 06/22/2020   BILITOT 0.3 06/22/2020   ALKPHOS 102 06/22/2020   AST 24 06/22/2020   ALT 24 06/22/2020   PROT 7.0 06/22/2020   ALBUMIN 4.5 06/22/2020   CALCIUM 9.5 06/22/2020   ANIONGAP 7 06/24/2018   EGFR 110 06/22/2020   Lab Results  Component Value Date   CHOL 131 02/27/2014   Lab Results  Component Value Date   HDL 71 05/09/2012   Lab Results  Component Value Date   LDLCALC 52 05/09/2012   Lab Results  Component Value Date   TRIG 50 05/09/2012   Lab Results  Component Value Date   CHOLHDL 1.9 05/09/2012   No results found for: "HGBA1C"     Assessment  & Plan:   Problem List Items Addressed This Visit   None  No orders of the defined types were placed in this encounter.   No orders of the defined types were placed in this  encounter.    Follow-up: No follow-ups on file.  An After Visit Summary was printed and given to the patient.  Rochel Brome, MD [facility] [phone]

## 2021-08-03 ENCOUNTER — Encounter: Payer: No Typology Code available for payment source | Admitting: Obstetrics and Gynecology

## 2021-08-03 ENCOUNTER — Encounter: Payer: Self-pay | Admitting: Family Medicine

## 2021-08-03 LAB — METHYLMALONIC ACID, SERUM: Methylmalonic Acid: 133 nmol/L (ref 0–378)

## 2021-08-03 LAB — CBC WITH DIFFERENTIAL/PLATELET
Basophils Absolute: 0.1 10*3/uL (ref 0.0–0.2)
Basos: 1 %
EOS (ABSOLUTE): 0.1 10*3/uL (ref 0.0–0.4)
Eos: 2 %
Hematocrit: 38.4 % (ref 34.0–46.6)
Hemoglobin: 12.9 g/dL (ref 11.1–15.9)
Immature Grans (Abs): 0 10*3/uL (ref 0.0–0.1)
Immature Granulocytes: 0 %
Lymphocytes Absolute: 2.2 10*3/uL (ref 0.7–3.1)
Lymphs: 38 %
MCH: 31 pg (ref 26.6–33.0)
MCHC: 33.6 g/dL (ref 31.5–35.7)
MCV: 92 fL (ref 79–97)
Monocytes Absolute: 0.4 10*3/uL (ref 0.1–0.9)
Monocytes: 7 %
Neutrophils Absolute: 3 10*3/uL (ref 1.4–7.0)
Neutrophils: 52 %
Platelets: 300 10*3/uL (ref 150–450)
RBC: 4.16 x10E6/uL (ref 3.77–5.28)
RDW: 12.1 % (ref 11.7–15.4)
WBC: 5.7 10*3/uL (ref 3.4–10.8)

## 2021-08-03 LAB — EPSTEIN-BARR VIRUS (EBV) ANTIBODY PROFILE
EBV NA IgG: >600 U/mL — ABNORMAL HIGH (ref 0.0–17.9)
EBV VCA IgG: >600 U/mL — ABNORMAL HIGH (ref 0.0–17.9)
EBV VCA IgM: <36 U/mL (ref 0.0–35.9)

## 2021-08-03 LAB — TSH: TSH: 2.21 u[IU]/mL (ref 0.450–4.500)

## 2021-08-03 LAB — B12 AND FOLATE PANEL
Folate: 9.2 ng/mL (ref >3.0)
Vitamin B-12: 795 pg/mL (ref 232–1245)

## 2021-08-03 LAB — COMPREHENSIVE METABOLIC PANEL
ALT: 17 IU/L (ref 0–32)
AST: 20 IU/L (ref 0–40)
Albumin/Globulin Ratio: 1.8 (ref 1.2–2.2)
Albumin: 4.3 g/dL (ref 3.8–4.8)
Alkaline Phosphatase: 93 IU/L (ref 44–121)
BUN/Creatinine Ratio: 14 (ref 9–23)
BUN: 9 mg/dL (ref 6–24)
Bilirubin Total: 0.3 mg/dL (ref 0.0–1.2)
CO2: 23 mmol/L (ref 20–29)
Calcium: 9.6 mg/dL (ref 8.7–10.2)
Chloride: 103 mmol/L (ref 96–106)
Creatinine, Ser: 0.65 mg/dL (ref 0.57–1.00)
Globulin, Total: 2.4 g/dL (ref 1.5–4.5)
Glucose: 103 mg/dL — ABNORMAL HIGH (ref 70–99)
Potassium: 4.3 mmol/L (ref 3.5–5.2)
Sodium: 140 mmol/L (ref 134–144)
Total Protein: 6.7 g/dL (ref 6.0–8.5)
eGFR: 107 mL/min/{1.73_m2} (ref >59)

## 2021-08-03 LAB — PHOSPHORUS: Phosphorus: 3.6 mg/dL (ref 3.0–4.3)

## 2021-08-03 LAB — MAGNESIUM: Magnesium: 2 mg/dL (ref 1.6–2.3)

## 2021-08-03 LAB — VITAMIN D 25 HYDROXY (VIT D DEFICIENCY, FRACTURES): Vit D, 25-Hydroxy: 28.7 ng/mL — ABNORMAL LOW (ref 30.0–100.0)

## 2021-08-03 NOTE — Assessment & Plan Note (Signed)
Check labs.  Continue amitriptyline and lyrica.

## 2021-08-03 NOTE — Assessment & Plan Note (Signed)
Check labs 

## 2021-08-04 ENCOUNTER — Other Ambulatory Visit: Payer: Self-pay

## 2021-08-04 MED ORDER — VITAMIN D (ERGOCALCIFEROL) 1.25 MG (50000 UNIT) PO CAPS
50000.0000 [IU] | ORAL_CAPSULE | ORAL | 0 refills | Status: DC
Start: 2021-08-04 — End: 2021-10-21

## 2021-08-04 MED ORDER — PREGABALIN 75 MG PO CAPS: 75.0000 mg | ORAL_CAPSULE | Freq: Three times a day (TID) | ORAL | 0 refills | Status: DC

## 2021-08-05 ENCOUNTER — Encounter: Payer: No Typology Code available for payment source | Admitting: Obstetrics and Gynecology

## 2021-08-08 ENCOUNTER — Ambulatory Visit (HOSPITAL_BASED_OUTPATIENT_CLINIC_OR_DEPARTMENT_OTHER)
Admission: RE | Admit: 2021-08-08 | Payer: No Typology Code available for payment source | Source: Ambulatory Visit | Admitting: Obstetrics and Gynecology

## 2021-08-08 SURGERY — INJECTION, BULKING AGENT, URETHRA
Anesthesia: Monitor Anesthesia Care

## 2021-08-15 ENCOUNTER — Other Ambulatory Visit: Payer: Self-pay | Admitting: Family Medicine

## 2021-08-15 DIAGNOSIS — F330 Major depressive disorder, recurrent, mild: Secondary | ICD-10-CM

## 2021-08-19 ENCOUNTER — Other Ambulatory Visit: Payer: Self-pay

## 2021-08-19 DIAGNOSIS — F988 Other specified behavioral and emotional disorders with onset usually occurring in childhood and adolescence: Secondary | ICD-10-CM

## 2021-08-19 MED ORDER — METHYLPHENIDATE HCL 20 MG PO TABS: 20.0000 mg | ORAL_TABLET | Freq: Three times a day (TID) | ORAL | 0 refills | Status: DC

## 2021-08-19 NOTE — Telephone Encounter (Signed)
Patient called requesting refill of Ritalin be sent to [facility] on [address].  Last refilled 07/20/21 #90

## 2021-09-06 ENCOUNTER — Encounter: Payer: No Typology Code available for payment source | Admitting: Obstetrics and Gynecology

## 2021-09-07 ENCOUNTER — Other Ambulatory Visit: Payer: Self-pay | Admitting: Family Medicine

## 2021-09-12 ENCOUNTER — Other Ambulatory Visit: Payer: Self-pay | Admitting: Nurse Practitioner

## 2021-09-12 DIAGNOSIS — G43009 Migraine without aura, not intractable, without status migrainosus: Secondary | ICD-10-CM

## 2021-09-13 ENCOUNTER — Other Ambulatory Visit: Payer: Self-pay

## 2021-09-13 DIAGNOSIS — F988 Other specified behavioral and emotional disorders with onset usually occurring in childhood and adolescence: Secondary | ICD-10-CM

## 2021-09-13 MED ORDER — METHYLPHENIDATE HCL 20 MG PO TABS
20.0000 mg | ORAL_TABLET | Freq: Three times a day (TID) | ORAL | 0 refills | Status: DC
Start: 2021-09-13 — End: 2021-10-19

## 2021-09-19 ENCOUNTER — Ambulatory Visit: Payer: Self-pay | Admitting: Family Medicine

## 2021-09-19 VITALS — BP 128/78 | HR 96 | Temp 97.4°F | Resp 18 | Ht 61.0 in | Wt 171.0 lb

## 2021-09-19 DIAGNOSIS — E559 Vitamin D deficiency, unspecified: Secondary | ICD-10-CM

## 2021-09-19 DIAGNOSIS — G43009 Migraine without aura, not intractable, without status migrainosus: Secondary | ICD-10-CM

## 2021-09-19 DIAGNOSIS — M797 Fibromyalgia: Secondary | ICD-10-CM

## 2021-09-19 DIAGNOSIS — F988 Other specified behavioral and emotional disorders with onset usually occurring in childhood and adolescence: Secondary | ICD-10-CM

## 2021-09-19 DIAGNOSIS — E538 Deficiency of other specified B group vitamins: Secondary | ICD-10-CM

## 2021-09-19 DIAGNOSIS — F330 Major depressive disorder, recurrent, mild: Secondary | ICD-10-CM

## 2021-09-19 DIAGNOSIS — E6609 Other obesity due to excess calories: Secondary | ICD-10-CM

## 2021-09-19 DIAGNOSIS — Z6832 Body mass index (BMI) 32.0-32.9, adult: Secondary | ICD-10-CM

## 2021-09-19 DIAGNOSIS — F5101 Primary insomnia: Secondary | ICD-10-CM

## 2021-09-19 MED ORDER — CLONAZEPAM 0.5 MG PO TABS
0.5000 mg | ORAL_TABLET | Freq: Two times a day (BID) | ORAL | 5 refills | Status: DC | PRN
Start: 2021-09-19 — End: 2022-04-10

## 2021-09-19 MED ORDER — DULOXETINE HCL 20 MG PO CPEP: 20.0000 mg | ORAL_CAPSULE | Freq: Two times a day (BID) | ORAL | 2 refills | Status: DC

## 2021-09-19 NOTE — Patient Instructions (Addendum)
Start on duloxetine 20 mg once daily at night x 1 week, then increase to twice daily. Call back in one month to let me know how you are doing.   Decrease lyrica to 75 mg twice daily x 1 week, than once daily x 1 week then off.  B12 deficiency: take b12 1000 mcg once daily OR 2500 mg SL daily.  

## 2021-09-19 NOTE — Progress Notes (Signed)
Subjective:  Patient ID: Anne Brown, female    DOB: [birth year]  Age: 51 y.o. MRN: [id number]  Chief Complaint  Patient presents with   Fibromyalgia   ADHD    HPI Fibromyalgia:  good on lyrica, but gaining weight. Patient is very frustrated. Mild recurrent depression: On wellbutrin, amitriptyline, and clonazepam.  Migraines: well controlled. On amitriptyline. Onychomycosis: terbinafine 250 mg daily. Not really helping.  Vitamin D 50K 2 times weekly.  B12 deficiency: Complaining of fatigue. Nasal congestion. Negative covid 19.  ADHD: Ritalin 20 mg TID with meals.  Current Outpatient Medications on File Prior to Visit  Medication Sig Dispense Refill   amitriptyline (ELAVIL) 100 MG tablet TAKE 1 TABLET BY MOUTH ONCE DAILY AT NIGHT 90 tablet 0   buPROPion (WELLBUTRIN SR) 150 MG 12 hr tablet TAKE 1 TABLET BY MOUTH AT BEDTIME AS NEEDED 30 tablet 0   butalbital-acetaminophen-caffeine (FIORICET) 50-325-40 MG tablet TAKE 1 TO 2 TABLETS BY MOUTH EVERY 6 HOURS AS NEEDED FOR HEADACHE 30 tablet 0   cetirizine (ZYRTEC) 10 MG tablet Take 10 mg by mouth at bedtime.     cyclobenzaprine (FLEXERIL) 5 MG tablet TAKE 1 TABLET BY MOUTH THREE TIMES DAILY AS NEEDED FOR  MUSCLE  SPASMS 270 tablet 1   meclizine (ANTIVERT) 25 MG tablet TAKE 1 TABLET BY MOUTH EVERY 6 HOURS AS NEEDED FOR DIZZINESS 30 tablet 0   meloxicam (MOBIC) 15 MG tablet Take 1 tablet by mouth once daily 90 tablet 1   methylphenidate (RITALIN) 20 MG tablet Take 1 tablet (20 mg total) by mouth 3 (three) times daily with meals. 90 tablet 0   pregabalin (LYRICA) 75 MG capsule TAKE 1 CAPSULE BY MOUTH THREE TIMES DAILY 90 capsule 0   promethazine (PHENERGAN) 12.5 MG tablet TAKE 1 TABLET BY MOUTH EVERY 6 HOURS AS NEEDED FOR NAUSEA 30 tablet 0   terbinafine (LAMISIL) 250 MG tablet Take 1 tablet by mouth once daily 90 tablet 0   Vitamin D, Ergocalciferol, (DRISDOL) 1.25 MG (50000 UNIT) CAPS capsule Take 1 capsule (50,000 Units total) by mouth 2  (two) times a week. 24 capsule 0   No current facility-administered medications on file prior to visit.   Past Medical History:  Diagnosis Date   Abnormal Pap smear    ADHD    Anxiety    Family history of adverse reaction to anesthesia    mother n/v severe   Headache    migraines   Lower back pain    LSIL (low grade squamous intraepithelial lesion) on Pap smear 10/15/2007   Past Surgical History:  Procedure Laterality Date   ANTERIOR AND POSTERIOR REPAIR N/A 06/27/2018   Procedure: ANTERIOR (CYSTOCELE) AND POSTERIOR REPAIR (RECTOCELE);  Surgeon: Osborn Coho, MD;  Location: [facility];  Service: Gynecology;  Laterality: N/A;   DILATION AND CURETTAGE OF UTERUS  1992   DILATION AND CURETTAGE OF UTERUS  2009   ECTOPIC PREGNANCY SURGERY  01/2008   After BTL   HYSTEROSCOPY WITH NOVASURE N/A 06/27/2018   Procedure: HYSTEROSCOPY WITH NOVASURE;  Surgeon: Osborn Coho, MD;  Location: [facility];  Service: Gynecology;  Laterality: N/A;   TUBAL LIGATION      Family History  Problem Relation Age of Onset   Depression Mother    Social History   Socioeconomic History   Marital status: Married    Spouse name: Not on file   Number of children: Not on file   Years of education: Not on file  Highest education level: Not on file  Occupational History   Not on file  Tobacco Use   Smoking status: Never   Smokeless tobacco: Never  Vaping Use   Vaping Use: Never used  Substance and Sexual Activity   Alcohol use: Yes    Alcohol/week: 0.0 standard drinks of alcohol    Comment: 3 glasses a week    Drug use: No   Sexual activity: Yes    Birth control/protection: None, Surgical    Comment: 1st intercourse- 17, partners-2  Other Topics Concern   Not on file  Social History Narrative   Not on file   Social Determinants of Health   Financial Resource Strain: Not on file  Food Insecurity: Not on file  Transportation Needs: Not on file  Physical  Activity: Not on file  Stress: Not on file  Social Connections: Not on file    Review of Systems  Constitutional:  Positive for fatigue. Negative for chills and fever.  HENT:  Positive for congestion. Negative for rhinorrhea and sore throat.   Respiratory:  Positive for cough. Negative for shortness of breath.   Cardiovascular:  Negative for chest pain.  Gastrointestinal:  Positive for abdominal pain and nausea. Negative for constipation, diarrhea and vomiting.  Genitourinary:  Negative for dysuria and urgency.  Musculoskeletal:  Positive for arthralgias (left elbow pain) and myalgias. Negative for back pain.  Neurological:  Positive for headaches. Negative for dizziness, weakness and light-headedness.  Psychiatric/Behavioral:  Negative for dysphoric mood. The patient is not nervous/anxious.      Objective:  BP 128/78   Pulse 96   Temp (!) 97.4 F (36.3 C)   Resp 18   Ht 5\' 1"  (1.549 m)   Wt 171 lb (77.6 kg)   BMI 32.31 kg/m      09/19/2021    9:58 AM 07/29/2021   11:45 AM 06/16/2021    2:00 PM  BP/Weight  Systolic BP 128 134 133  Diastolic BP 78 78 84  Wt. (Lbs) 171 167   BMI 32.31 kg/m2 31.55 kg/m2     Physical Exam Vitals reviewed.  Constitutional:      Appearance: Normal appearance.  HENT:     Nose: Congestion (erythematous turbinates. nontender sinuses) present.     Mouth/Throat:     Pharynx: Oropharynx is clear.  Cardiovascular:     Rate and Rhythm: Normal rate and regular rhythm.     Heart sounds: Normal heart sounds. No murmur heard. Pulmonary:     Effort: Pulmonary effort is normal. No respiratory distress.     Breath sounds: Normal breath sounds.  Abdominal:     General: Bowel sounds are normal.     Palpations: Abdomen is soft.     Tenderness: There is no abdominal tenderness.  Musculoskeletal:        General: Tenderness (FM trigger points.) present.  Lymphadenopathy:     Cervical: No cervical adenopathy.  Neurological:     Mental Status: She is  alert and oriented to person, place, and time.  Psychiatric:        Mood and Affect: Mood normal.        Behavior: Behavior normal.     Diabetic Foot Exam - Simple   No data filed      Lab Results  Component Value Date   WBC 5.7 07/29/2021   HGB 12.9 07/29/2021   HCT 38.4 07/29/2021   PLT 300 07/29/2021   GLUCOSE 103 (H) 07/29/2021   CHOL 141 09/19/2021  TRIG 62 09/19/2021   HDL 79 09/19/2021   LDLCALC 49 09/19/2021   ALT 17 07/29/2021   AST 20 07/29/2021   NA 140 07/29/2021   K 4.3 07/29/2021   CL 103 07/29/2021   CREATININE 0.65 07/29/2021   BUN 9 07/29/2021   CO2 23 07/29/2021   TSH 2.210 07/29/2021   INR 0.9 06/22/2020      Assessment & Plan:   Problem List Items Addressed This Visit       Cardiovascular and Mediastinum   Migraines    Continue amitriptyline.       Relevant Medications   clonazePAM (KLONOPIN) 0.5 MG tablet   DULoxetine (CYMBALTA) 20 MG capsule     Other   Attention deficit disorder (ADD) without hyperactivity    The current medical regimen is effective;  continue present plan and medications. Continue ritalin 20 mg three times a day       Primary insomnia    Continue amitriptyline.       Mild recurrent major depression (HCC)    Continue wellbutrin, amitriptyline, and clonazepam.  Start on duloxetine.      Relevant Medications   clonazePAM (KLONOPIN) 0.5 MG tablet   DULoxetine (CYMBALTA) 20 MG capsule   Fibromyalgia - Primary    Start on duloxetine 20 mg once daily at night x 1 week, then increase to twice daily. Call back in one month to let me know how you are doing.   Decrease lyrica to 75 mg twice daily x 1 week, than once daily x 1 week then off.  B12 deficiency: take b12 1000 mcg once daily OR 2500 mg SL daily.       Relevant Medications   clonazePAM (KLONOPIN) 0.5 MG tablet   DULoxetine (CYMBALTA) 20 MG capsule   B12 deficiency    Recommend otc b12 1000 mcg daily or 2500 mg SL daily.       Mild vitamin D  deficiency    Continue vitamin D 50K twice weekly.       Other Visit Diagnoses     Class 1 obesity due to excess calories without serious comorbidity with body mass index (BMI) of 32.0 to 32.9 in adult       Relevant Orders   Lipid panel (Completed)     .  Meds ordered this encounter  Medications   clonazePAM (KLONOPIN) 0.5 MG tablet    Sig: Take 1 tablet (0.5 mg total) by mouth 2 (two) times daily as needed. for anxiety    Dispense:  60 tablet    Refill:  5   DULoxetine (CYMBALTA) 20 MG capsule    Sig: Take 1 capsule (20 mg total) by mouth 2 (two) times daily.    Dispense:  60 capsule    Refill:  2    Orders Placed This Encounter  Procedures   Lipid panel     Follow-up: Return in about 6 weeks (around 10/31/2021) for chronic follow up.  An After Visit Summary was printed and given to the patient.  Blane Ohara, MD [facility] [phone]

## 2021-09-20 LAB — LIPID PANEL
Chol/HDL Ratio: 1.8 ratio (ref 0.0–4.4)
Cholesterol, Total: 141 mg/dL (ref 100–199)
HDL: 79 mg/dL (ref >39)
LDL Chol Calc (NIH): 49 mg/dL (ref 0–99)
Triglycerides: 62 mg/dL (ref 0–149)
VLDL Cholesterol Cal: 13 mg/dL (ref 5–40)

## 2021-09-21 NOTE — Progress Notes (Signed)
Cholesterol great.

## 2021-09-26 ENCOUNTER — Other Ambulatory Visit: Payer: Self-pay | Admitting: Family Medicine

## 2021-09-26 ENCOUNTER — Encounter: Payer: Self-pay | Admitting: Family Medicine

## 2021-09-26 DIAGNOSIS — F330 Major depressive disorder, recurrent, mild: Secondary | ICD-10-CM

## 2021-09-26 DIAGNOSIS — G43909 Migraine, unspecified, not intractable, without status migrainosus: Secondary | ICD-10-CM | POA: Insufficient documentation

## 2021-09-26 DIAGNOSIS — E538 Deficiency of other specified B group vitamins: Secondary | ICD-10-CM | POA: Insufficient documentation

## 2021-09-26 DIAGNOSIS — E559 Vitamin D deficiency, unspecified: Secondary | ICD-10-CM | POA: Insufficient documentation

## 2021-09-26 NOTE — Assessment & Plan Note (Signed)
The current medical regimen is effective;  continue present plan and medications. Continue ritalin 20 mg three times a day

## 2021-09-26 NOTE — Assessment & Plan Note (Signed)
Start on duloxetine 20 mg once daily at night x 1 week, then increase to twice daily. Call back in one month to let me know how you are doing.   Decrease lyrica to 75 mg twice daily x 1 week, than once daily x 1 week then off.  B12 deficiency: take b12 1000 mcg once daily OR 2500 mg SL daily.

## 2021-09-26 NOTE — Assessment & Plan Note (Signed)
Continue wellbutrin, amitriptyline, and clonazepam.  Start on duloxetine.

## 2021-09-26 NOTE — Assessment & Plan Note (Signed)
Continue amitriptyline

## 2021-09-26 NOTE — Assessment & Plan Note (Addendum)
Recommend otc b12 1000 mcg daily or 2500 mg SL daily.

## 2021-09-26 NOTE — Assessment & Plan Note (Signed)
Continue vitamin D 50 K twice weekly.  

## 2021-10-03 IMAGING — MR MR LUMBAR SPINE W/O CM
4 of 5 series · 25 of 48 positions shown · non-contrast
Comparison: None.

CLINICAL DATA: Low back pain extending down the right leg with
weakness and numbness

EXAM:
MRI LUMBAR SPINE WITHOUT CONTRAST
TECHNIQUE: Multiplanar, multisequence MR imaging of the lumbar spine was
performed. No intravenous contrast was administered.

[Series 2: T2 · sagittal · 4.0mm · 0.53mm/px · 7 of 15 slices shown (1 of 2)]
[im 1/15]
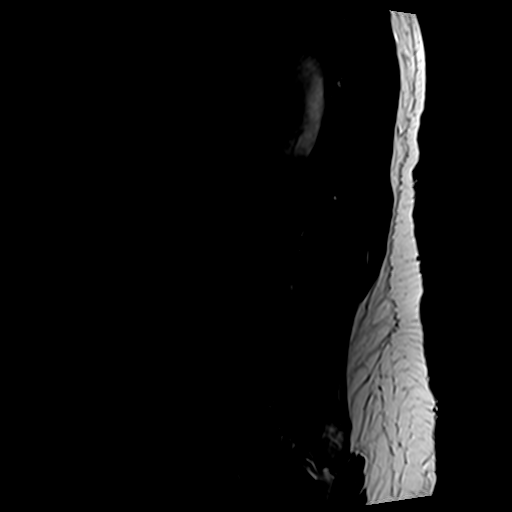
[im 3/15]
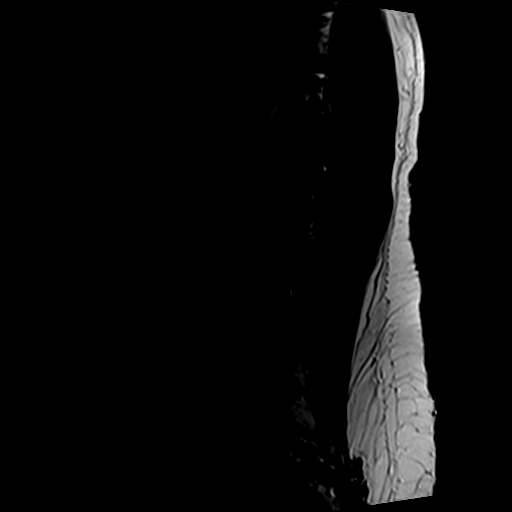
[im 5/15]
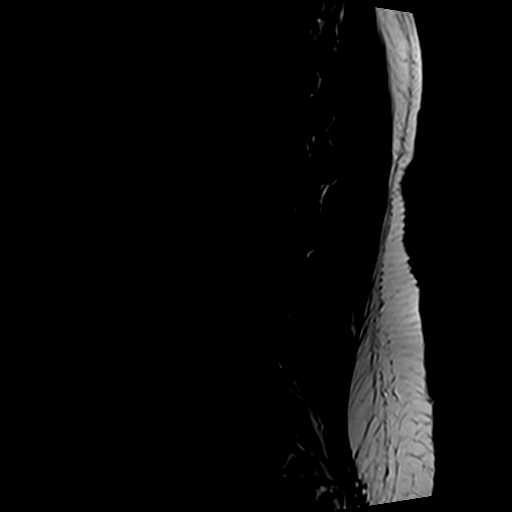
[im 8/15]
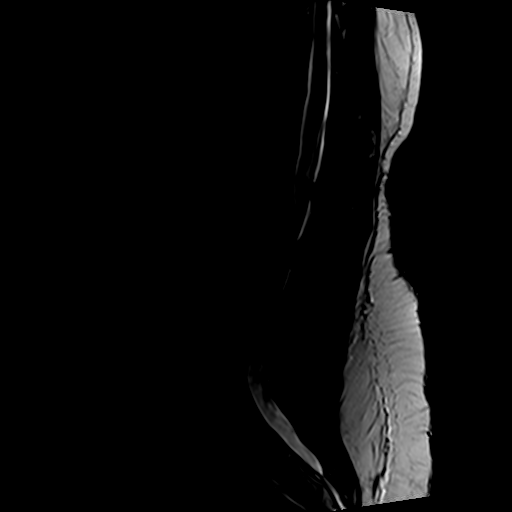
[im 10/15]
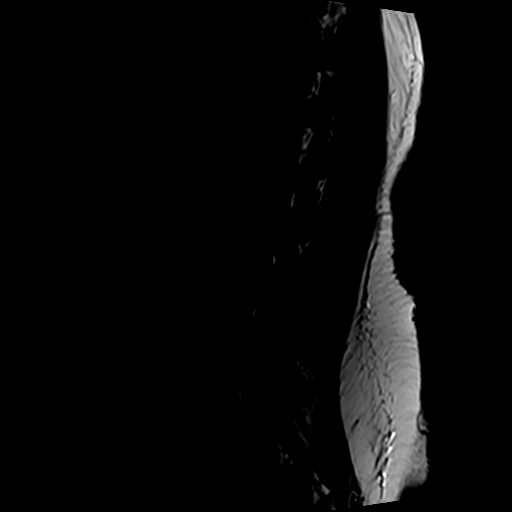
[im 12/15]
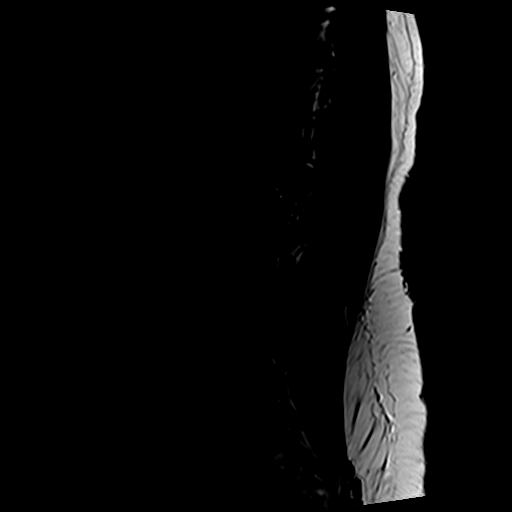
[im 15/15]
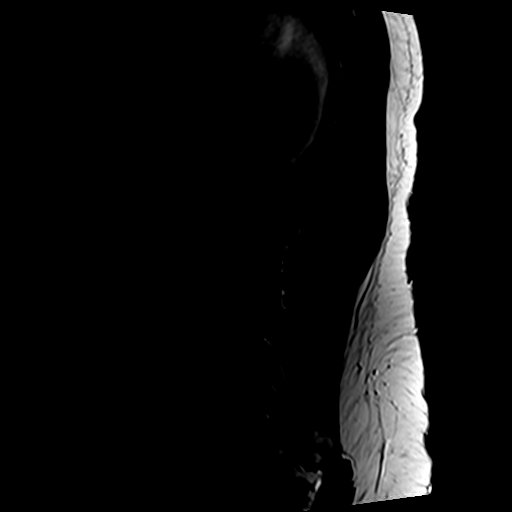

[Series 4: T1 · sagittal · 4.0mm · 0.53mm/px · 6 of 15 slices shown (1 of 2)]
[im 1/15]
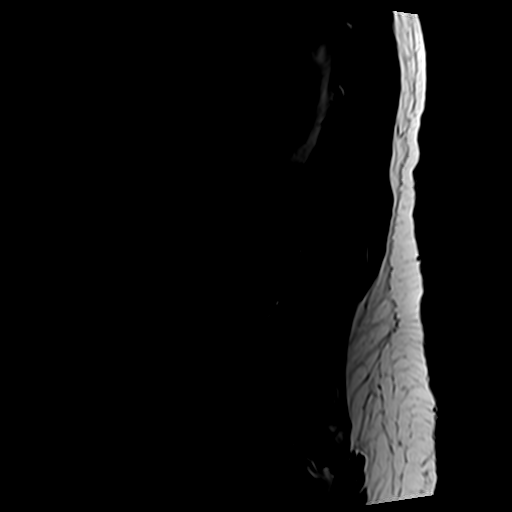
[im 3/15]
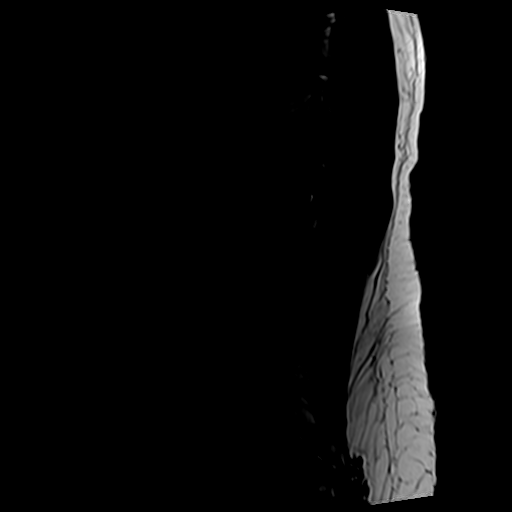
[im 6/15]
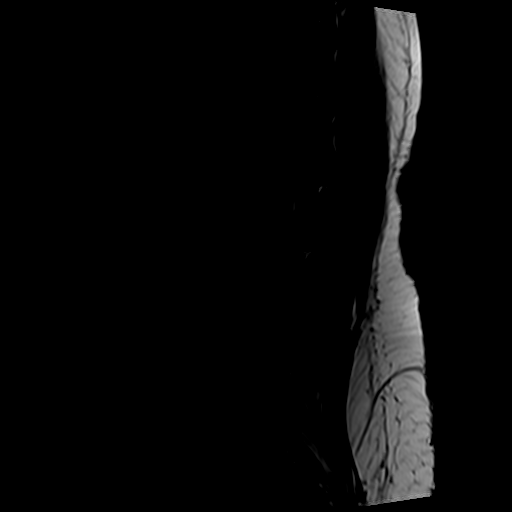
[im 9/15]
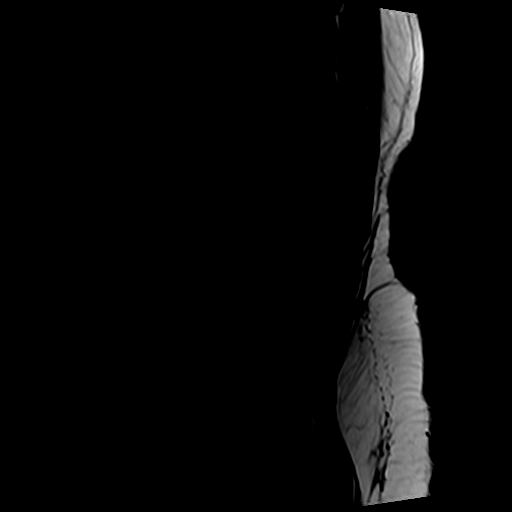
[im 12/15]
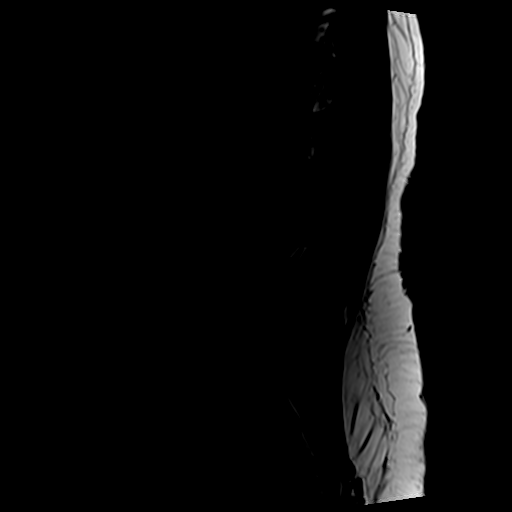
[im 15/15]
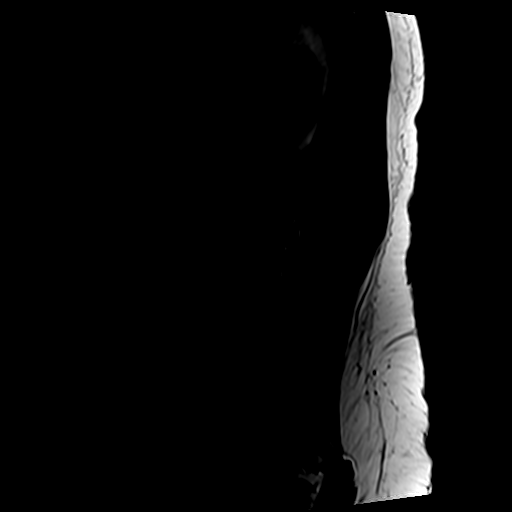

[Series 5: T2 · axial · 4.0mm · 0.70mm/px · z∈[-73,+116]mm · 8 of 34 slices shown (2 of 2)]
[im 1/34]
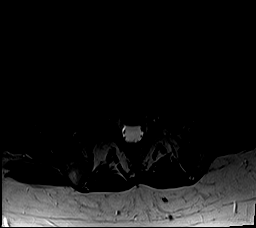
[im 6/34]
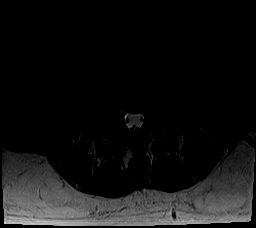
[im 11/34]
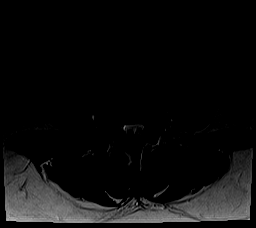
[im 16/34]
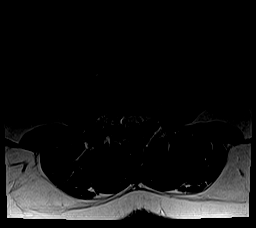
[im 18/34]
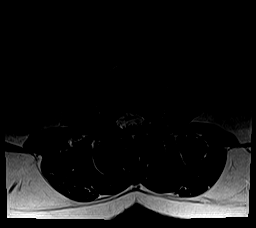
[im 23/34]
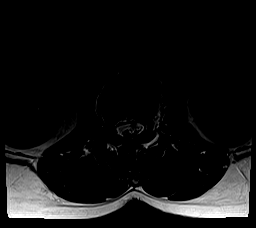
[im 28/34]
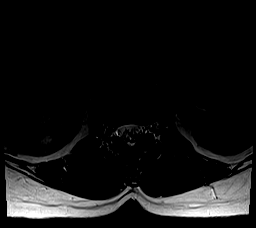
[im 34/34]
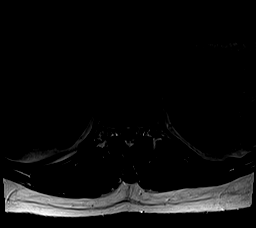

[Series 6: T1 · axial · 4.0mm · 0.35mm/px · z∈[-73,+85]mm · 4 of 34 slices shown (2 of 2)]
[im 1/34]
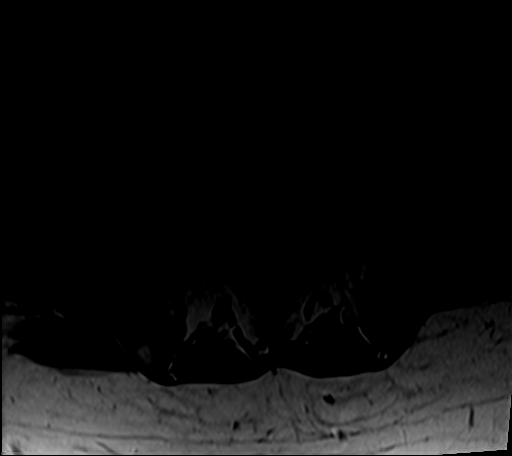
[im 6/34]
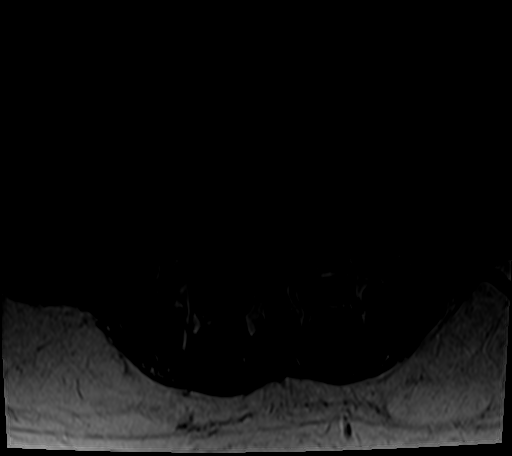
[im 18/34]
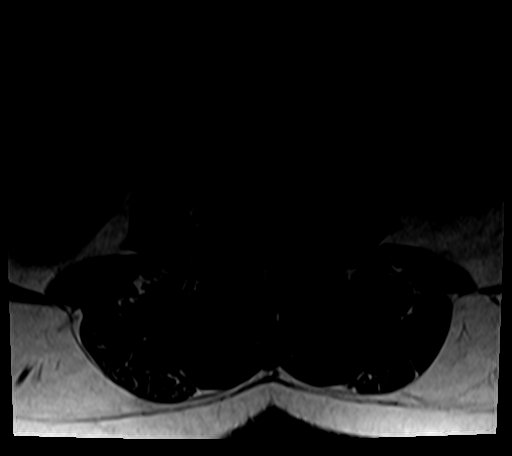
[im 28/34]
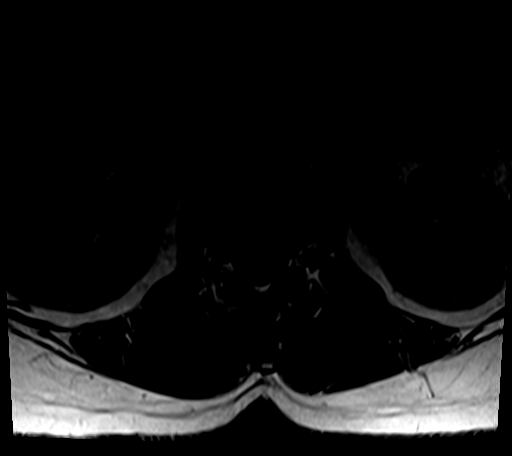

[25 of 48 positions shown; findings below may reference images not displayed]

FINDINGS: Segmentation:  Standard.

Alignment:  1-2 mm retrolisthesis of L4 on L5.

Vertebrae:  No fracture, evidence of discitis, or bone lesion.

Conus medullaris and cauda equina: Conus extends to the L1 level.
Conus and cauda equina appear normal.

Paraspinal and other soft tissues: No acute paraspinal abnormality.

Disc levels:

Disc spaces: Degenerative disease with disc height loss at L2-3,
L3-4, and L4-5.

T12-L1: No significant disc bulge. No neural foraminal stenosis. No
central canal stenosis.

L1-L2: No significant disc bulge. No neural foraminal stenosis. No
central canal stenosis.

L2-L3: Mild broad-based disc bulge with a right paracentral disc
extrusion resulting in mass effect on the right intraspinal L3 nerve
root. Overall moderate spinal stenosis. No foraminal stenosis.

L3-L4: Mild broad-based disc bulge. No foraminal or central canal
stenosis.

L4-L5: Broad-based disc bulge with a right foraminal/lateral disc
protrusion contacting the right L4 nerve root. Moderate right
foraminal stenosis. No left foraminal stenosis. Mild spinal
stenosis.

L5-S1: Mild broad-based disc bulge. Mild bilateral facet
arthropathy. Moderate bilateral foraminal stenosis. No spinal
stenosis.
IMPRESSION: 1. At L2-3 there is a mild broad-based disc bulge with a right
paracentral disc extrusion resulting in mass effect on the right
intraspinal L3 nerve root. Overall moderate spinal stenosis.
2. At L4-5 there is a broad-based disc bulge with a right
foraminal/lateral disc protrusion contacting the right L4 nerve
root. Moderate right foraminal stenosis. Mild spinal stenosis.
3. At L5-S1 there is a mild broad-based disc bulge. Mild bilateral
facet arthropathy. Moderate bilateral foraminal stenosis.

## 2021-10-05 ENCOUNTER — Encounter: Payer: Self-pay | Admitting: Family Medicine

## 2021-10-07 ENCOUNTER — Other Ambulatory Visit: Payer: Self-pay | Admitting: Family Medicine

## 2021-10-19 ENCOUNTER — Other Ambulatory Visit: Payer: Self-pay

## 2021-10-19 DIAGNOSIS — F988 Other specified behavioral and emotional disorders with onset usually occurring in childhood and adolescence: Secondary | ICD-10-CM

## 2021-10-19 MED ORDER — METHYLPHENIDATE HCL 20 MG PO TABS
20.0000 mg | ORAL_TABLET | Freq: Three times a day (TID) | ORAL | 0 refills | Status: DC
Start: 2021-10-19 — End: 2021-11-22

## 2021-10-21 ENCOUNTER — Other Ambulatory Visit: Payer: Self-pay | Admitting: Family Medicine

## 2021-10-31 ENCOUNTER — Ambulatory Visit (INDEPENDENT_AMBULATORY_CARE_PROVIDER_SITE_OTHER): Payer: Self-pay | Admitting: Family Medicine

## 2021-10-31 ENCOUNTER — Encounter: Payer: Self-pay | Admitting: Family Medicine

## 2021-10-31 VITALS — Temp 98.1°F | Resp 15 | Ht 61.0 in | Wt 165.0 lb

## 2021-10-31 DIAGNOSIS — Z23 Encounter for immunization: Secondary | ICD-10-CM

## 2021-10-31 DIAGNOSIS — M797 Fibromyalgia: Secondary | ICD-10-CM

## 2021-10-31 DIAGNOSIS — F988 Other specified behavioral and emotional disorders with onset usually occurring in childhood and adolescence: Secondary | ICD-10-CM

## 2021-10-31 MED ORDER — DULOXETINE HCL 20 MG PO CPEP: 20.0000 mg | ORAL_CAPSULE | Freq: Two times a day (BID) | ORAL | 0 refills | Status: DC

## 2021-10-31 NOTE — Assessment & Plan Note (Signed)
The current medical regimen is effective;  continue present plan and medications.  

## 2021-10-31 NOTE — Patient Instructions (Addendum)
Increase duloxetine to 20 mg 3 daily x 2 weeks, then may increase to 20 mg 2 twice daily.   Call back and update [place] on how you are doing.

## 2021-10-31 NOTE — Progress Notes (Signed)
Subjective:  Patient ID: Anne Brown, female    DOB: 28-Feb-1970  Age: 51 y.o. MRN: [id number]  Chief Complaint  Patient presents with   Fibromyalgia   ADHD   HPI Fibromyalgia: Patient was taking Lyrica 75 mg TID, but she was gaining weight. Medication was changed to Cymbalta 20 mg twice a day. She states medicine is not helping like Lyrica was. She asked if the dose can be adjusted.  She also has ADHD and she is here for follow up of Ritalin. She is doing good.  Current Outpatient Medications on File Prior to Visit  Medication Sig Dispense Refill   amitriptyline (ELAVIL) 100 MG tablet TAKE 1 TABLET BY MOUTH ONCE DAILY AT NIGHT 90 tablet 0   buPROPion (WELLBUTRIN SR) 150 MG 12 hr tablet TAKE 1 TABLET BY MOUTH AT BEDTIME AS NEEDED 30 tablet 0   butalbital-acetaminophen-caffeine (FIORICET) 50-325-40 MG tablet TAKE 1 TO 2 TABLETS BY MOUTH EVERY 6 HOURS AS NEEDED FOR HEADACHE 30 tablet 0   cetirizine (ZYRTEC) 10 MG tablet Take 10 mg by mouth at bedtime.     clonazePAM (KLONOPIN) 0.5 MG tablet Take 1 tablet (0.5 mg total) by mouth 2 (two) times daily as needed. for anxiety 60 tablet 5   cyclobenzaprine (FLEXERIL) 5 MG tablet TAKE 1 TABLET BY MOUTH THREE TIMES DAILY AS NEEDED FOR  MUSCLE  SPASMS 270 tablet 1   meclizine (ANTIVERT) 25 MG tablet TAKE 1 TABLET BY MOUTH EVERY 6 HOURS AS NEEDED FOR DIZZINESS 30 tablet 0   meloxicam (MOBIC) 15 MG tablet Take 1 tablet by mouth once daily 90 tablet 1   methylphenidate (RITALIN) 20 MG tablet Take 1 tablet (20 mg total) by mouth 3 (three) times daily with meals. 90 tablet 0   OnabotulinumtoxinA, Cosmetic, (BOTOX COSMETIC IM)      promethazine (PHENERGAN) 12.5 MG tablet TAKE 1 TABLET BY MOUTH EVERY 6 HOURS AS NEEDED FOR NAUSEA 30 tablet 0   terbinafine (LAMISIL) 250 MG tablet Take 1 tablet by mouth once daily 90 tablet 0   Vitamin D, Ergocalciferol, (DRISDOL) 1.25 MG (50000 UNIT) CAPS capsule TAKE 1 CAPSULE BY MOUTH TWICE A WEEK 24 capsule 0   No  current facility-administered medications on file prior to visit.   Past Medical History:  Diagnosis Date   Abnormal Pap smear    ADHD    Anxiety    Family history of adverse reaction to anesthesia    mother n/v severe   Headache    migraines   Lower back pain    LSIL (low grade squamous intraepithelial lesion) on Pap smear 10/15/2007   Past Surgical History:  Procedure Laterality Date   ANTERIOR AND POSTERIOR REPAIR N/A 06/27/2018   Procedure: ANTERIOR (CYSTOCELE) AND POSTERIOR REPAIR (RECTOCELE);  Surgeon: Osborn Coho, MD;  Location: [facility];  Service: Gynecology;  Laterality: N/A;   DILATION AND CURETTAGE OF UTERUS  1992   DILATION AND CURETTAGE OF UTERUS  2009   ECTOPIC PREGNANCY SURGERY  01/2008   After BTL   HYSTEROSCOPY WITH NOVASURE N/A 06/27/2018   Procedure: HYSTEROSCOPY WITH NOVASURE;  Surgeon: Osborn Coho, MD;  Location: [facility];  Service: Gynecology;  Laterality: N/A;   TUBAL LIGATION      Family History  Problem Relation Age of Onset   Depression Mother    Social History   Socioeconomic History   Marital status: Married    Spouse name: Not on file   Number of children: Not on  file   Years of education: Not on file   Highest education level: Not on file  Occupational History   Not on file  Tobacco Use   Smoking status: Never   Smokeless tobacco: Never  Vaping Use   Vaping Use: Never used  Substance and Sexual Activity   Alcohol use: Yes    Alcohol/week: 0.0 standard drinks of alcohol    Comment: 3 glasses a week    Drug use: No   Sexual activity: Yes    Birth control/protection: None, Surgical    Comment: 1st intercourse- 17, partners-2  Other Topics Concern   Not on file  Social History Narrative   Not on file   Social Determinants of Health   Financial Resource Strain: Not on file  Food Insecurity: Not on file  Transportation Needs: Not on file  Physical Activity: Not on file  Stress: Not on file   Social Connections: Not on file    Review of Systems  Constitutional:  Negative for chills, fatigue and fever.  HENT:  Negative for congestion, ear pain and sore throat.   Respiratory:  Negative for cough and shortness of breath.   Cardiovascular:  Negative for chest pain and palpitations.  Gastrointestinal:  Negative for abdominal pain, constipation, diarrhea, nausea and vomiting.  Endocrine: Negative for polydipsia, polyphagia and polyuria.  Genitourinary:  Negative for difficulty urinating and dysuria.  Musculoskeletal:  Negative for arthralgias, back pain and myalgias.  Skin:  Negative for rash.  Neurological:  Positive for numbness (Right arm). Negative for headaches.  Psychiatric/Behavioral:  Negative for dysphoric mood. The patient is not nervous/anxious.      Objective:  Temp 98.1 F (36.7 C)   Resp 15   Ht 5\' 1"  (1.549 m)   Wt 165 lb (74.8 kg)   BMI 31.18 kg/m      10/31/2021   10:03 AM 09/19/2021    9:58 AM 07/29/2021   11:45 AM  BP/Weight  Systolic BP  778 242  Diastolic BP  78 78  Wt. (Lbs) 165 171 167  BMI 31.18 kg/m2 32.31 kg/m2 31.55 kg/m2    Physical Exam Vitals reviewed.  Constitutional:      Appearance: Normal appearance. She is normal weight.  Neck:     Vascular: No carotid bruit.  Cardiovascular:     Rate and Rhythm: Normal rate and regular rhythm.     Heart sounds: Normal heart sounds.  Pulmonary:     Effort: Pulmonary effort is normal. No respiratory distress.     Breath sounds: Normal breath sounds.  Abdominal:     General: Abdomen is flat. Bowel sounds are normal.     Palpations: Abdomen is soft.     Tenderness: There is no abdominal tenderness.  Musculoskeletal:        General: Tenderness (fm trigger points positive.) present.  Neurological:     Mental Status: She is alert and oriented to person, place, and time.     Comments: Negative tinels and phalens.   Psychiatric:        Mood and Affect: Mood normal.        Behavior: Behavior  normal.     Diabetic Foot Exam - Simple   No data filed      Lab Results  Component Value Date   WBC 5.7 07/29/2021   HGB 12.9 07/29/2021   HCT 38.4 07/29/2021   PLT 300 07/29/2021   GLUCOSE 103 (H) 07/29/2021   CHOL 141 09/19/2021   TRIG 62 09/19/2021  HDL 79 09/19/2021   LDLCALC 49 09/19/2021   ALT 17 07/29/2021   AST 20 07/29/2021   NA 140 07/29/2021   K 4.3 07/29/2021   CL 103 07/29/2021   CREATININE 0.65 07/29/2021   BUN 9 07/29/2021   CO2 23 07/29/2021   TSH 2.210 07/29/2021   INR 0.9 06/22/2020      Assessment & Plan:   Problem List Items Addressed This Visit       Other   Attention deficit disorder (ADD) without hyperactivity    The current medical regimen is effective;  continue present plan and medications.       Fibromyalgia - Primary    Increase duloxetine to 20 mg 3 daily x 2 weeks, then may increase to 20 mg 2 twice daily.   Call back and update [place] on how you are doing.      Relevant Medications   DULoxetine (CYMBALTA) 20 MG capsule   Other Visit Diagnoses     Need for immunization against influenza       Relevant Orders   Flu Vaccine QUAD 2mo+IM (Fluarix, Fluzone & Alfiuria Quad PF) (Completed)     .  Meds ordered this encounter  Medications   DULoxetine (CYMBALTA) 20 MG capsule    Sig: Take 1 capsule (20 mg total) by mouth 2 (two) times daily.    Dispense:  180 capsule    Refill:  0    Orders Placed This Encounter  Procedures   Flu Vaccine QUAD 83mo+IM (Fluarix, Fluzone & Alfiuria Quad PF)     Follow-up: Return in about 4 months (around 03/03/2022) for chronic follow up.  An After Visit Summary was printed and given to the patient.  Blane Ohara, MD [facility] [phone]

## 2021-10-31 NOTE — Assessment & Plan Note (Signed)
Increase duloxetine to 20 mg 3 daily x 2 weeks, then may increase to 20 mg 2 twice daily.   Call back and update us on how you are doing. 

## 2021-11-03 ENCOUNTER — Other Ambulatory Visit: Payer: Self-pay | Admitting: Family Medicine

## 2021-11-03 MED ORDER — DULOXETINE HCL 20 MG PO CPEP: 20.0000 mg | ORAL_CAPSULE | Freq: Two times a day (BID) | ORAL | 0 refills | Status: DC

## 2021-11-06 ENCOUNTER — Other Ambulatory Visit: Payer: Self-pay | Admitting: Family Medicine

## 2021-11-06 ENCOUNTER — Other Ambulatory Visit: Payer: Self-pay | Admitting: Physician Assistant

## 2021-11-06 DIAGNOSIS — G43009 Migraine without aura, not intractable, without status migrainosus: Secondary | ICD-10-CM

## 2021-11-06 DIAGNOSIS — F330 Major depressive disorder, recurrent, mild: Secondary | ICD-10-CM

## 2021-11-16 ENCOUNTER — Other Ambulatory Visit: Payer: Self-pay | Admitting: Family Medicine

## 2021-11-22 ENCOUNTER — Other Ambulatory Visit: Payer: Self-pay

## 2021-11-22 DIAGNOSIS — F988 Other specified behavioral and emotional disorders with onset usually occurring in childhood and adolescence: Secondary | ICD-10-CM

## 2021-11-22 MED ORDER — METHYLPHENIDATE HCL 20 MG PO TABS: 20.0000 mg | ORAL_TABLET | Freq: Three times a day (TID) | ORAL | 0 refills | Status: DC

## 2021-12-28 ENCOUNTER — Encounter: Payer: Self-pay | Admitting: Family Medicine

## 2021-12-28 ENCOUNTER — Other Ambulatory Visit: Payer: Self-pay | Admitting: Family Medicine

## 2021-12-28 ENCOUNTER — Other Ambulatory Visit: Payer: Self-pay

## 2021-12-28 DIAGNOSIS — F988 Other specified behavioral and emotional disorders with onset usually occurring in childhood and adolescence: Secondary | ICD-10-CM

## 2021-12-28 DIAGNOSIS — M797 Fibromyalgia: Secondary | ICD-10-CM

## 2021-12-28 MED ORDER — DULOXETINE HCL 60 MG PO CPEP: 60.0000 mg | ORAL_CAPSULE | Freq: Every day | ORAL | 0 refills | Status: DC

## 2021-12-29 MED ORDER — METHYLPHENIDATE HCL 20 MG PO TABS: 20.0000 mg | ORAL_TABLET | Freq: Three times a day (TID) | ORAL | 0 refills | Status: DC

## 2022-01-22 ENCOUNTER — Other Ambulatory Visit: Payer: Self-pay | Admitting: Legal Medicine

## 2022-01-27 ENCOUNTER — Telehealth: Payer: Self-pay | Admitting: Nurse Practitioner

## 2022-01-27 ENCOUNTER — Telehealth: Payer: Self-pay

## 2022-01-27 DIAGNOSIS — T3695XA Adverse effect of unspecified systemic antibiotic, initial encounter: Secondary | ICD-10-CM

## 2022-01-27 DIAGNOSIS — B379 Candidiasis, unspecified: Secondary | ICD-10-CM

## 2022-01-27 DIAGNOSIS — J40 Bronchitis, not specified as acute or chronic: Secondary | ICD-10-CM

## 2022-01-27 MED ORDER — FLUCONAZOLE 150 MG PO TABS: 150.0000 mg | ORAL_TABLET | Freq: Once | ORAL | 0 refills | Status: AC

## 2022-01-27 MED ORDER — AZITHROMYCIN 250 MG PO TABS: ORAL_TABLET | ORAL | 0 refills | Status: AC

## 2022-01-27 NOTE — Progress Notes (Signed)
We are sorry that you are not feeling well.  Here is how we plan to help!  Based on your presentation I believe you most likely have A cough due to bacteria.  When patients have a fever and a productive cough with a change in color or increased sputum production, we are concerned about bacterial bronchitis.  If left untreated it can progress to pneumonia.  If your symptoms do not improve with your treatment plan it is important that you contact your provider.   I have prescribed Azithromyin 250 mg: two tablets now and then one tablet daily for 4 additonal days    In addition you may use A non-prescription cough medication called Mucinex DM: take 2 tablets every 12 hours.   From your responses in the eVisit questionnaire you describe inflammation in the upper respiratory tract which is causing a significant cough.  This is commonly called Bronchitis and has four common causes:   Allergies Viral Infections Acid Reflux Bacterial Infection Allergies, viruses and acid reflux are treated by controlling symptoms or eliminating the cause. An example might be a cough caused by taking certain blood pressure medications. You stop the cough by changing the medication. Another example might be a cough caused by acid reflux. Controlling the reflux helps control the cough.   HOME CARE Only take medications as instructed by your medical team. Complete the entire course of an antibiotic. Drink plenty of fluids and get plenty of rest. Avoid close contacts especially the very young and the elderly Cover your mouth if you cough or cough into your sleeve. Always remember to wash your hands A steam or ultrasonic humidifier can help congestion.   GET HELP RIGHT AWAY IF: You develop worsening fever. You become short of breath You cough up blood. Your symptoms persist after you have completed your treatment plan MAKE SURE YOU  Understand these instructions. Will watch your condition. Will get help right away  if you are not doing well or get worse.    Thank you for choosing an e-visit.  Your e-visit answers were reviewed by a board certified advanced clinical practitioner to complete your personal care plan. Depending upon the condition, your plan could have included both over the counter or prescription medications.  Please review your pharmacy choice. Make sure the pharmacy is open so you can pick up prescription now. If there is a problem, you may contact your provider through [employer] and have the prescription routed to another pharmacy.  Your safety is important to [place]. If you have drug allergies check your prescription carefully.   For the next 24 hours you can use MyChart to ask questions about today's visit, request a non-urgent call back, or ask for a work or school excuse. You will get an email in the next two days asking about your experience. I hope that your e-visit has been valuable and will speed your recovery.  Meds ordered this encounter  Medications   azithromycin (ZITHROMAX) 250 MG tablet    Sig: Take 2 tablets on day 1, then 1 tablet daily on days 2 through 5    Dispense:  6 tablet    Refill:  0    I spent approximately 5 minutes reviewing the patient's history, current symptoms and coordinating their care today.

## 2022-01-27 NOTE — Addendum Note (Signed)
Addended by: Madilyn Hook on: 01/27/2022 09:41 AM   Modules accepted: Orders

## 2022-01-27 NOTE — Telephone Encounter (Signed)
Patient called this morning requesting an appointment. Patient was notified that we do not have any openings for today and it was recommended for the patient to go to UC to be seen or she is able to login into mychart to go an e-visit with a [facility] provider.

## 2022-01-30 ENCOUNTER — Other Ambulatory Visit: Payer: Self-pay

## 2022-01-30 DIAGNOSIS — F988 Other specified behavioral and emotional disorders with onset usually occurring in childhood and adolescence: Secondary | ICD-10-CM

## 2022-01-30 DIAGNOSIS — F330 Major depressive disorder, recurrent, mild: Secondary | ICD-10-CM

## 2022-01-30 MED ORDER — METHYLPHENIDATE HCL 20 MG PO TABS: 20.0000 mg | ORAL_TABLET | Freq: Three times a day (TID) | ORAL | 0 refills | Status: DC

## 2022-01-30 MED ORDER — BUPROPION HCL ER (SR) 150 MG PO TB12: ORAL_TABLET | ORAL | 1 refills | Status: DC

## 2022-02-06 ENCOUNTER — Other Ambulatory Visit: Payer: Self-pay

## 2022-02-06 DIAGNOSIS — F988 Other specified behavioral and emotional disorders with onset usually occurring in childhood and adolescence: Secondary | ICD-10-CM

## 2022-02-06 MED ORDER — METHYLPHENIDATE HCL 20 MG PO TABS: 20.0000 mg | ORAL_TABLET | Freq: Three times a day (TID) | ORAL | 0 refills | Status: DC

## 2022-02-14 ENCOUNTER — Encounter: Payer: Self-pay | Admitting: Family Medicine

## 2022-02-14 ENCOUNTER — Other Ambulatory Visit: Payer: Self-pay

## 2022-02-14 DIAGNOSIS — F988 Other specified behavioral and emotional disorders with onset usually occurring in childhood and adolescence: Secondary | ICD-10-CM

## 2022-02-14 MED ORDER — METHYLPHENIDATE HCL 10 MG PO TABS
20.0000 mg | ORAL_TABLET | Freq: Three times a day (TID) | ORAL | 0 refills | Status: DC
  Filled 2022-02-14: qty 180, 30d supply, fill #0

## 2022-02-15 ENCOUNTER — Other Ambulatory Visit: Payer: Self-pay | Admitting: Family Medicine

## 2022-02-15 ENCOUNTER — Other Ambulatory Visit (HOSPITAL_COMMUNITY): Payer: Self-pay

## 2022-02-15 DIAGNOSIS — G43009 Migraine without aura, not intractable, without status migrainosus: Secondary | ICD-10-CM

## 2022-02-15 DIAGNOSIS — M5441 Lumbago with sciatica, right side: Secondary | ICD-10-CM

## 2022-02-16 DIAGNOSIS — Z0000 Encounter for general adult medical examination without abnormal findings: Secondary | ICD-10-CM | POA: Diagnosis not present

## 2022-02-16 DIAGNOSIS — E559 Vitamin D deficiency, unspecified: Secondary | ICD-10-CM | POA: Diagnosis not present

## 2022-02-16 DIAGNOSIS — Z124 Encounter for screening for malignant neoplasm of cervix: Secondary | ICD-10-CM | POA: Diagnosis not present

## 2022-03-02 ENCOUNTER — Ambulatory Visit: Payer: 59 | Admitting: Family Medicine

## 2022-03-02 ENCOUNTER — Encounter: Payer: Self-pay | Admitting: Family Medicine

## 2022-03-02 VITALS — BP 124/74 | HR 90 | Temp 97.2°F | Ht 61.0 in | Wt 161.0 lb

## 2022-03-02 DIAGNOSIS — N3941 Urge incontinence: Secondary | ICD-10-CM

## 2022-03-02 DIAGNOSIS — E559 Vitamin D deficiency, unspecified: Secondary | ICD-10-CM | POA: Diagnosis not present

## 2022-03-02 DIAGNOSIS — N3000 Acute cystitis without hematuria: Secondary | ICD-10-CM

## 2022-03-02 LAB — POCT URINALYSIS DIP (CLINITEK)
Bilirubin, UA: NEGATIVE
Blood, UA: NEGATIVE
Glucose, UA: NEGATIVE mg/dL
Ketones, POC UA: NEGATIVE mg/dL
Nitrite, UA: NEGATIVE
Spec Grav, UA: 1.025 (ref 1.010–1.025)
Urobilinogen, UA: 0.2 E.U./dL
pH, UA: 6 (ref 5.0–8.0)

## 2022-03-02 MED ORDER — NITROFURANTOIN MONOHYD MACRO 100 MG PO CAPS: 100.0000 mg | ORAL_CAPSULE | Freq: Two times a day (BID) | ORAL | 0 refills | Status: DC

## 2022-03-02 MED ORDER — VITAMIN D (ERGOCALCIFEROL) 1.25 MG (50000 UNIT) PO CAPS: 50000.0000 [IU] | ORAL_CAPSULE | ORAL | 3 refills | Status: DC

## 2022-03-02 MED ORDER — OXYBUTYNIN CHLORIDE 5 MG PO TABS: 5.0000 mg | ORAL_TABLET | Freq: Three times a day (TID) | ORAL | 0 refills | Status: DC

## 2022-03-02 NOTE — Progress Notes (Signed)
Acute Office Visit  Subjective:    Patient ID: Anne Brown, female    DOB: 01-12-1971, 52 y.o.   MRN: [id number]  Chief Complaint  Patient presents with   Urinary Tract Infection    HPI: Patient is in today for Urinary symptoms  She reports new onset dysuria, urinary urgency, and odor . The current episode started  2 days ago and is staying constant. Patient states symptoms are mild in intensity, occurring constantly. She  has not been recently treated for similar symptoms. Patient had seen gynecology and they prescribed myrbetriq. It was very expensive.    Fibromyalgia:  Discontinued lyrica due to weight gain. Medication was changed to Cymbalta 60 mg daily. Helping. Also on amitriptyline and flexeril.   She also has ADHD and she is here for follow up of Ritalin. She is doing well.  Migraines well controlled. On amitriptyline. Uses fiorecet.  Past Medical History:  Diagnosis Date   Abnormal Pap smear    ADHD    Anxiety    Family history of adverse reaction to anesthesia    mother n/v severe   Headache    migraines   Lower back pain    LSIL (low grade squamous intraepithelial lesion) on Pap smear 10/15/2007    Past Surgical History:  Procedure Laterality Date   ANTERIOR AND POSTERIOR REPAIR N/A 06/27/2018   Procedure: ANTERIOR (CYSTOCELE) AND POSTERIOR REPAIR (RECTOCELE);  Surgeon: Everett Graff, MD;  Location: [facility];  Service: Gynecology;  Laterality: N/A;   DILATION AND CURETTAGE OF UTERUS  1992   DILATION AND CURETTAGE OF UTERUS  2009   ECTOPIC PREGNANCY SURGERY  01/2008   After BTL   HYSTEROSCOPY WITH NOVASURE N/A 06/27/2018   Procedure: HYSTEROSCOPY WITH NOVASURE;  Surgeon: Everett Graff, MD;  Location: [facility];  Service: Gynecology;  Laterality: N/A;   TUBAL LIGATION      Family History  Problem Relation Age of Onset   Depression Mother     Social History   Socioeconomic History   Marital status: Married    Spouse  name: Not on file   Number of children: Not on file   Years of education: Not on file   Highest education level: Not on file  Occupational History   Not on file  Tobacco Use   Smoking status: Never   Smokeless tobacco: Never  Vaping Use   Vaping Use: Never used  Substance and Sexual Activity   Alcohol use: Yes    Alcohol/week: 0.0 standard drinks of alcohol    Comment: 3 glasses a week    Drug use: No   Sexual activity: Yes    Birth control/protection: None, Surgical    Comment: 1st intercourse- 17, partners-2  Other Topics Concern   Not on file  Social History Narrative   Not on file   Social Determinants of Health   Financial Resource Strain: Low Risk  (03/02/2022)   Overall Financial Resource Strain (CARDIA)    Difficulty of Paying Living Expenses: Not hard at all  Food Insecurity: No Food Insecurity (03/02/2022)   Hunger Vital Sign    Worried About Running Out of Food in the Last Year: Never true    Ran Out of Food in the Last Year: Never true  Transportation Needs: No Transportation Needs (03/02/2022)   PRAPARE - Hydrologist (Medical): No    Lack of Transportation (Non-Medical): No  Physical Activity: Insufficiently Active (03/02/2022)   Exercise Vital  Sign    Days of Exercise per Week: 3 days    Minutes of Exercise per Session: 30 min  Stress: No Stress Concern Present (03/02/2022)   [place]    Feeling of Stress : Not at all  Social Connections: Moderately Integrated (03/02/2022)   Social Connection and Isolation Panel [NHANES]    Frequency of Communication with Friends and Family: More than three times a week    Frequency of Social Gatherings with Friends and Family: More than three times a week    Attends Religious Services: More than 4 times per year    Active Member of [employer] or Organizations: No    Attends Archivist Meetings: Never    Marital Status: Married   Human resources officer Violence: Not At Risk (03/02/2022)   Humiliation, Afraid, Rape, and Kick questionnaire    Fear of Current or Ex-Partner: No    Emotionally Abused: No    Physically Abused: No    Sexually Abused: No    Outpatient Medications Prior to Visit  Medication Sig Dispense Refill   MYRBETRIQ 25 MG TB24 tablet Take 25 mg by mouth daily.     buPROPion (WELLBUTRIN SR) 150 MG 12 hr tablet TAKE 1 TABLET BY MOUTH EVERY DAY AT BEDTIME AS NEEDED 90 tablet 1   butalbital-acetaminophen-caffeine (FIORICET) 50-325-40 MG tablet TAKE 1 TO 2 TABLETS BY MOUTH EVERY 6 HOURS AS NEEDED FOR HEADACHE 30 tablet 0   cetirizine (ZYRTEC) 10 MG tablet Take 10 mg by mouth at bedtime.     clonazePAM (KLONOPIN) 0.5 MG tablet Take 1 tablet (0.5 mg total) by mouth 2 (two) times daily as needed. for anxiety 60 tablet 5   cyclobenzaprine (FLEXERIL) 5 MG tablet Take 1 tablet by mouth three times daily as needed for muscle spasm 90 tablet 0   DULoxetine (CYMBALTA) 60 MG capsule Take 1 capsule (60 mg total) by mouth daily. 90 capsule 0   meclizine (ANTIVERT) 25 MG tablet TAKE 1 TABLET BY MOUTH EVERY 6 HOURS AS NEEDED FOR DIZZINESS 30 tablet 0   meloxicam (MOBIC) 15 MG tablet Take 1 tablet by mouth once daily 90 tablet 1   methylphenidate (RITALIN) 10 MG tablet Take 2 tablets (20 mg total) by mouth 3 (three) times daily with meals. 180 tablet 0   OnabotulinumtoxinA, Cosmetic, (BOTOX COSMETIC IM)      promethazine (PHENERGAN) 12.5 MG tablet TAKE 1 TABLET BY MOUTH EVERY 6 HOURS AS NEEDED FOR NAUSEA 30 tablet 0   amitriptyline (ELAVIL) 100 MG tablet TAKE 1 TABLET BY MOUTH ONCE DAILY AT NIGHT 90 tablet 0   terbinafine (LAMISIL) 250 MG tablet Take 1 tablet by mouth once daily 90 tablet 0   Vitamin D, Ergocalciferol, (DRISDOL) 1.25 MG (50000 UNIT) CAPS capsule TAKE 1 CAPSULE BY MOUTH TWICE A WEEK 24 capsule 0   No facility-administered medications prior to visit.    No Known Allergies  Review of Systems  Constitutional:   Negative for chills and fever.  HENT:  Negative for congestion and sore throat.   Respiratory:  Negative for cough and shortness of breath.   Cardiovascular:  Negative for chest pain.  Gastrointestinal:  Negative for abdominal pain and nausea.  Genitourinary:  Positive for dysuria, frequency and urgency. Negative for flank pain.  Musculoskeletal:  Negative for back pain.       Objective:        03/02/2022    8:56 AM 10/31/2021   10:03 AM  09/19/2021    9:58 AM  Vitals with BMI  Height 5' 1"$  5' 1"$  5' 1"$   Weight 161 lbs 165 lbs 171 lbs  BMI 30.44 [id number]  Systolic A999333  0000000  Diastolic 74  78  Pulse 90  96    No data found.   Physical Exam Vitals reviewed.  Constitutional:      Appearance: Normal appearance. She is normal weight.  Neck:     Vascular: No carotid bruit.  Cardiovascular:     Rate and Rhythm: Normal rate and regular rhythm.     Heart sounds: Normal heart sounds.  Pulmonary:     Effort: Pulmonary effort is normal. No respiratory distress.     Breath sounds: Normal breath sounds.  Abdominal:     General: Abdomen is flat. Bowel sounds are normal.     Palpations: Abdomen is soft.     Tenderness: There is no abdominal tenderness.  Neurological:     Mental Status: She is alert and oriented to person, place, and time.  Psychiatric:        Mood and Affect: Mood normal.        Behavior: Behavior normal.     Health Maintenance Due  Topic Date Due   DTaP/Tdap/Td (1 - Tdap) Never done   COLONOSCOPY (Pts 45-110yr Insurance coverage will need to be confirmed)  Never done   PAP SMEAR-Modifier  02/27/2017   MAMMOGRAM  05/14/2021   Zoster Vaccines- Shingrix (1 of 2) Never done   COVID-19 Vaccine (5 - 2023-24 season) 09/23/2021    There are no preventive care reminders to display for this patient.   Lab Results  Component Value Date   TSH 2.210 07/29/2021   Lab Results  Component Value Date   WBC 5.7 07/29/2021   HGB 12.9 07/29/2021   HCT 38.4  07/29/2021   MCV 92 07/29/2021   PLT 300 07/29/2021   Lab Results  Component Value Date   NA 140 07/29/2021   K 4.3 07/29/2021   CO2 23 07/29/2021   GLUCOSE 103 (H) 07/29/2021   BUN 9 07/29/2021   CREATININE 0.65 07/29/2021   BILITOT 0.3 07/29/2021   ALKPHOS 93 07/29/2021   AST 20 07/29/2021   ALT 17 07/29/2021   PROT 6.7 07/29/2021   ALBUMIN 4.3 07/29/2021   CALCIUM 9.6 07/29/2021   ANIONGAP 7 06/24/2018   EGFR 107 07/29/2021   Lab Results  Component Value Date   CHOL 141 09/19/2021   Lab Results  Component Value Date   HDL 79 09/19/2021   Lab Results  Component Value Date   LDLCALC 49 09/19/2021   Lab Results  Component Value Date   TRIG 62 09/19/2021   Lab Results  Component Value Date   CHOLHDL 1.8 09/19/2021   No results found for: "HGBA1C"     Assessment & Plan:  Acute cystitis without hematuria Prescription: macrobid.  Urge incontinence Start on oxybutynin. Discontinued myrbetriq.  Vitamin D deficiency The current medical regimen is effective;  continue present plan and medications. Continue vitamin D 50K three times a week.    Meds ordered this encounter  Medications   nitrofurantoin, macrocrystal-monohydrate, (MACROBID) 100 MG capsule    Sig: Take 1 capsule (100 mg total) by mouth 2 (two) times daily.    Dispense:  14 capsule    Refill:  0   oxybutynin (DITROPAN) 5 MG tablet    Sig: Take 1 tablet (5 mg total) by mouth 3 (three) times daily.  Dispense:  90 tablet    Refill:  0   Vitamin D, Ergocalciferol, (DRISDOL) 1.25 MG (50000 UNIT) CAPS capsule    Sig: Take 1 capsule (50,000 Units total) by mouth 3 (three) times a week.    Dispense:  36 capsule    Refill:  3    Orders Placed This Encounter  Procedures   Urine Culture   POCT URINALYSIS DIP (CLINITEK)     Follow-up: Return in about 3 months (around 05/31/2022) for chronic fasting.  An After Visit Summary was printed and given to the patient.  Rochel Brome, MD [facility] [phone]

## 2022-03-04 ENCOUNTER — Other Ambulatory Visit: Payer: Self-pay | Admitting: Family Medicine

## 2022-03-05 DIAGNOSIS — N3941 Urge incontinence: Secondary | ICD-10-CM | POA: Insufficient documentation

## 2022-03-05 LAB — URINE CULTURE

## 2022-03-05 NOTE — Assessment & Plan Note (Signed)
The current medical regimen is effective;  continue present plan and medications. Continue vitamin D 50K three times a week.

## 2022-03-05 NOTE — Assessment & Plan Note (Signed)
Start on oxybutynin. Discontinued myrbetriq.

## 2022-03-05 NOTE — Assessment & Plan Note (Signed)
Prescription: macrobid.

## 2022-03-06 ENCOUNTER — Ambulatory Visit: Payer: Self-pay | Admitting: Family Medicine

## 2022-03-13 ENCOUNTER — Telehealth: Payer: Self-pay

## 2022-03-13 ENCOUNTER — Other Ambulatory Visit: Payer: Self-pay | Admitting: Family Medicine

## 2022-03-13 MED ORDER — MIRABEGRON ER 25 MG PO TB24: 25.0000 mg | ORAL_TABLET | Freq: Every day | ORAL | 2 refills | Status: DC

## 2022-03-13 NOTE — Telephone Encounter (Addendum)
Anne Brown called with concerns that she may be having an adverse reaction to oxybutynin.  She has severe dry mouth and sensitive patches in the back of her throat.  She is unable to tolerate anything spicy.    Dr. Tobie Poet advised that she discontinue the oxybutynin.  She is willing to try the myrbetriq if insurance will cover.

## 2022-03-14 ENCOUNTER — Ambulatory Visit: Payer: 59 | Admitting: Physician Assistant

## 2022-03-14 ENCOUNTER — Encounter: Payer: Self-pay | Admitting: Physician Assistant

## 2022-03-14 VITALS — BP 138/70 | HR 86 | Temp 97.3°F | Resp 18 | Ht 61.0 in | Wt 163.4 lb

## 2022-03-14 DIAGNOSIS — J069 Acute upper respiratory infection, unspecified: Secondary | ICD-10-CM

## 2022-03-14 DIAGNOSIS — J029 Acute pharyngitis, unspecified: Secondary | ICD-10-CM | POA: Diagnosis not present

## 2022-03-14 LAB — POC COVID19 BINAXNOW: SARS Coronavirus 2 Ag: NEGATIVE

## 2022-03-14 MED ORDER — AZITHROMYCIN 250 MG PO TABS: ORAL_TABLET | ORAL | 0 refills | Status: AC

## 2022-03-14 MED ORDER — LIDOCAINE VISCOUS HCL 2 % MT SOLN: 5.0000 mL | Freq: Three times a day (TID) | OROMUCOSAL | 0 refills | Status: DC

## 2022-03-14 NOTE — Progress Notes (Signed)
Acute Office Visit  Subjective:    Patient ID: Anne Brown, female    DOB: 1971/01/22, 52 y.o.   MRN: [id number]  Chief Complaint  Patient presents with   Sore Throat   Cough    HPI Patient is in today for complaints of cough, sore throat and congestion since the weekend.  States sore to swallow and feels burning when swallowing.  Throat feels dry.  Has had some cough as well.  Denies fever.  Is taking zyrtec qd  Past Medical History:  Diagnosis Date   Abnormal Pap smear    ADHD    Anxiety    Family history of adverse reaction to anesthesia    mother n/v severe   Headache    migraines   Lower back pain    LSIL (low grade squamous intraepithelial lesion) on Pap smear 10/15/2007    Past Surgical History:  Procedure Laterality Date   ANTERIOR AND POSTERIOR REPAIR N/A 06/27/2018   Procedure: ANTERIOR (CYSTOCELE) AND POSTERIOR REPAIR (RECTOCELE);  Surgeon: Everett Graff, MD;  Location: [facility];  Service: Gynecology;  Laterality: N/A;   DILATION AND CURETTAGE OF UTERUS  1992   DILATION AND CURETTAGE OF UTERUS  2009   ECTOPIC PREGNANCY SURGERY  01/2008   After BTL   HYSTEROSCOPY WITH NOVASURE N/A 06/27/2018   Procedure: HYSTEROSCOPY WITH NOVASURE;  Surgeon: Everett Graff, MD;  Location: [facility];  Service: Gynecology;  Laterality: N/A;   TUBAL LIGATION      Family History  Problem Relation Age of Onset   Depression Mother     Social History   Socioeconomic History   Marital status: Married    Spouse name: Not on file   Number of children: Not on file   Years of education: Not on file   Highest education level: Not on file  Occupational History   Not on file  Tobacco Use   Smoking status: Never   Smokeless tobacco: Never  Vaping Use   Vaping Use: Never used  Substance and Sexual Activity   Alcohol use: Yes    Alcohol/week: 0.0 standard drinks of alcohol    Comment: 3 glasses a week    Drug use: No   Sexual activity: Yes     Birth control/protection: None, Surgical    Comment: 1st intercourse- 17, partners-2  Other Topics Concern   Not on file  Social History Narrative   Not on file   Social Determinants of Health   Financial Resource Strain: Low Risk  (03/02/2022)   Overall Financial Resource Strain (CARDIA)    Difficulty of Paying Living Expenses: Not hard at all  Food Insecurity: No Food Insecurity (03/02/2022)   Hunger Vital Sign    Worried About Running Out of Food in the Last Year: Never true    Ran Out of Food in the Last Year: Never true  Transportation Needs: No Transportation Needs (03/02/2022)   PRAPARE - Hydrologist (Medical): No    Lack of Transportation (Non-Medical): No  Physical Activity: Insufficiently Active (03/02/2022)   Exercise Vital Sign    Days of Exercise per Week: 3 days    Minutes of Exercise per Session: 30 min  Stress: No Stress Concern Present (03/02/2022)   [place]    Feeling of Stress : Not at all  Social Connections: Moderately Integrated (03/02/2022)   Social Connection and Isolation Panel [NHANES]    Frequency  of Communication with Friends and Family: More than three times a week    Frequency of Social Gatherings with Friends and Family: More than three times a week    Attends Religious Services: More than 4 times per year    Active Member of [employer] or Organizations: No    Attends Archivist Meetings: Never    Marital Status: Married  Human resources officer Violence: Not At Risk (03/02/2022)   Humiliation, Afraid, Rape, and Kick questionnaire    Fear of Current or Ex-Partner: No    Emotionally Abused: No    Physically Abused: No    Sexually Abused: No     Current Outpatient Medications:    amitriptyline (ELAVIL) 100 MG tablet, TAKE 1 TABLET BY MOUTH ONCE DAILY AT NIGHT, Disp: 90 tablet, Rfl: 0   azithromycin (ZITHROMAX) 250 MG tablet, Take 2 tablets on day 1, then 1  tablet daily on days 2 through 5, Disp: 6 tablet, Rfl: 0   buPROPion (WELLBUTRIN SR) 150 MG 12 hr tablet, TAKE 1 TABLET BY MOUTH EVERY DAY AT BEDTIME AS NEEDED, Disp: 90 tablet, Rfl: 1   butalbital-acetaminophen-caffeine (FIORICET) 50-325-40 MG tablet, TAKE 1 TO 2 TABLETS BY MOUTH EVERY 6 HOURS AS NEEDED FOR HEADACHE, Disp: 30 tablet, Rfl: 0   cetirizine (ZYRTEC) 10 MG tablet, Take 10 mg by mouth at bedtime., Disp: , Rfl:    clonazePAM (KLONOPIN) 0.5 MG tablet, Take 1 tablet (0.5 mg total) by mouth 2 (two) times daily as needed. for anxiety, Disp: 60 tablet, Rfl: 5   cyclobenzaprine (FLEXERIL) 5 MG tablet, Take 1 tablet by mouth three times daily as needed for muscle spasm, Disp: 90 tablet, Rfl: 0   DULoxetine (CYMBALTA) 60 MG capsule, Take 1 capsule (60 mg total) by mouth daily., Disp: 90 capsule, Rfl: 0   magic mouthwash (lidocaine, diphenhydrAMINE, alum & mag hydroxide) suspension, Swish and swallow 5 mLs 3 (three) times daily., Disp: 360 mL, Rfl: 0   meclizine (ANTIVERT) 25 MG tablet, TAKE 1 TABLET BY MOUTH EVERY 6 HOURS AS NEEDED FOR DIZZINESS, Disp: 30 tablet, Rfl: 0   meloxicam (MOBIC) 15 MG tablet, Take 1 tablet by mouth once daily, Disp: 90 tablet, Rfl: 1   methylphenidate (RITALIN) 10 MG tablet, Take 2 tablets (20 mg total) by mouth 3 (three) times daily with meals., Disp: 180 tablet, Rfl: 0   mirabegron ER (MYRBETRIQ) 25 MG TB24 tablet, Take 1 tablet (25 mg total) by mouth daily., Disp: 30 tablet, Rfl: 2   nitrofurantoin, macrocrystal-monohydrate, (MACROBID) 100 MG capsule, Take 1 capsule (100 mg total) by mouth 2 (two) times daily., Disp: 14 capsule, Rfl: 0   OnabotulinumtoxinA, Cosmetic, (BOTOX COSMETIC IM), , Disp: , Rfl:    promethazine (PHENERGAN) 12.5 MG tablet, TAKE 1 TABLET BY MOUTH EVERY 6 HOURS AS NEEDED FOR NAUSEA, Disp: 30 tablet, Rfl: 0   Vitamin D, Ergocalciferol, (DRISDOL) 1.25 MG (50000 UNIT) CAPS capsule, Take 1 capsule (50,000 Units total) by mouth 3 (three) times a week.,  Disp: 36 capsule, Rfl: 3   Allergies  Allergen Reactions   Oxybutynin Other (See Comments)    Severe dry mouth and mouth ulcers    CONSTITUTIONAL: Negative for chills, fatigue, fever,  E/N/T: see HPI CARDIOVASCULAR: Negative for chest pain, dizziness,  RESPIRATORY: see HPI GASTROINTESTINAL: Negative for abdominal pain, acid reflux symptoms, constipation, diarrhea, nausea and vomiting.      Objective:    PHYSICAL EXAM:   VS: BP 138/70   Pulse 86   Temp Marland Kitchen)  97.3 F (36.3 C)   Resp 18   Ht 5' 1"$  (1.549 m)   Wt 163 lb 6.4 oz (74.1 kg)   SpO2 97%   BMI 30.87 kg/m   GEN: Well nourished, well developed, in no acute distress  HEENT: normal external ears and nose - normal external auditory canals and TMS -  - Lips, Teeth and Gums - normal  Oropharynx - moderate erythema with pnd noted Cardiac: RRR; no murmurs, rubs, Respiratory:  normal respiratory rate and pattern with no distress - normal breath sounds with no rales, rhonchi, wheezes or rubs  Office Visit on 03/14/2022  Component Date Value Ref Range Status   SARS Coronavirus 2 Ag 03/14/2022 Negative  Negative Final      Wt Readings from Last 3 Encounters:  03/14/22 163 lb 6.4 oz (74.1 kg)  03/02/22 161 lb (73 kg)  10/31/21 165 lb (74.8 kg)    Health Maintenance Due  Topic Date Due   DTaP/Tdap/Td (1 - Tdap) Never done   COLONOSCOPY (Pts 45-55yr Insurance coverage will need to be confirmed)  Never done   PAP SMEAR-Modifier  02/27/2017   MAMMOGRAM  05/14/2021   Zoster Vaccines- Shingrix (1 of 2) Never done   COVID-19 Vaccine (5 - 2023-24 season) 09/23/2021    There are no preventive care reminders to display for this patient.        Assessment & Plan:   Problem List Items Addressed This Visit       Respiratory   URTI (acute upper respiratory infection) - Primary   Relevant Medications   azithromycin (ZITHROMAX) 250 MG tablet   magic mouthwash (lidocaine, diphenhydrAMINE, alum & mag hydroxide)  suspension   Other Relevant Orders   POC COVID-19 BinaxNow (Completed)   Pharyngitis   Relevant Medications   azithromycin (ZITHROMAX) 250 MG tablet   magic mouthwash (lidocaine, diphenhydrAMINE, alum & mag hydroxide) suspension     Meds ordered this encounter  Medications   azithromycin (ZITHROMAX) 250 MG tablet    Sig: Take 2 tablets on day 1, then 1 tablet daily on days 2 through 5    Dispense:  6 tablet    Refill:  0    Order Specific Question:   Supervising Provider    Answer:Shelton Silvas  magic mouthwash (lidocaine, diphenhydrAMINE, alum & mag hydroxide) suspension    Sig: Swish and swallow 5 mLs 3 (three) times daily.    Dispense:  360 mL    Refill:  0    Order Specific Question:   Supervising Provider    Answer:   COX, KIRSTEN [[id number]    [place] PA-C

## 2022-03-14 NOTE — Telephone Encounter (Signed)
Patient called and stated that she needs to be seen, she thought that she was having a reaction from the medication Saturday her throat started burning and now she has ST, Cough, and stated she do have sores in her mouth. She did a home Covid test on Sunday and it was negative.

## 2022-03-15 ENCOUNTER — Ambulatory Visit: Payer: 59 | Admitting: Physician Assistant

## 2022-03-28 ENCOUNTER — Other Ambulatory Visit: Payer: Self-pay | Admitting: Family Medicine

## 2022-03-28 DIAGNOSIS — M797 Fibromyalgia: Secondary | ICD-10-CM

## 2022-03-28 DIAGNOSIS — M5441 Lumbago with sciatica, right side: Secondary | ICD-10-CM

## 2022-04-08 ENCOUNTER — Other Ambulatory Visit: Payer: Self-pay | Admitting: Family Medicine

## 2022-04-08 DIAGNOSIS — F330 Major depressive disorder, recurrent, mild: Secondary | ICD-10-CM

## 2022-04-17 ENCOUNTER — Other Ambulatory Visit: Payer: Self-pay

## 2022-04-17 DIAGNOSIS — F988 Other specified behavioral and emotional disorders with onset usually occurring in childhood and adolescence: Secondary | ICD-10-CM

## 2022-04-18 MED ORDER — METHYLPHENIDATE HCL 10 MG PO TABS: 20.0000 mg | ORAL_TABLET | Freq: Three times a day (TID) | ORAL | 0 refills | Status: DC

## 2022-04-19 DIAGNOSIS — R300 Dysuria: Secondary | ICD-10-CM | POA: Diagnosis not present

## 2022-04-19 DIAGNOSIS — R3989 Other symptoms and signs involving the genitourinary system: Secondary | ICD-10-CM | POA: Diagnosis not present

## 2022-05-03 ENCOUNTER — Telehealth: Payer: Self-pay

## 2022-05-03 NOTE — Telephone Encounter (Signed)
I was unable to leave a message on the patients phone. The provider is out of the office for 06/02/2022. Appointment has been canceled. Waiting for the patient to return the call.

## 2022-05-07 ENCOUNTER — Other Ambulatory Visit: Payer: Self-pay | Admitting: Family Medicine

## 2022-05-07 DIAGNOSIS — M5441 Lumbago with sciatica, right side: Secondary | ICD-10-CM

## 2022-05-22 ENCOUNTER — Other Ambulatory Visit: Payer: Self-pay

## 2022-05-22 DIAGNOSIS — F988 Other specified behavioral and emotional disorders with onset usually occurring in childhood and adolescence: Secondary | ICD-10-CM

## 2022-05-22 MED ORDER — METHYLPHENIDATE HCL 10 MG PO TABS: 20.0000 mg | ORAL_TABLET | Freq: Three times a day (TID) | ORAL | 0 refills | Status: DC

## 2022-05-29 ENCOUNTER — Other Ambulatory Visit: Payer: Self-pay | Admitting: Family Medicine

## 2022-06-02 ENCOUNTER — Ambulatory Visit: Payer: Self-pay | Admitting: Family Medicine

## 2022-06-16 ENCOUNTER — Telehealth: Payer: Self-pay | Admitting: Family Medicine

## 2022-06-16 DIAGNOSIS — M722 Plantar fascial fibromatosis: Secondary | ICD-10-CM | POA: Diagnosis not present

## 2022-06-16 NOTE — Telephone Encounter (Signed)
Pt called today to request a same day appointment for the following symptoms:foot pain worsen.   Unfortunately, our schedule is full and we have no openings between today or tomorrow. Pt was notified to go to Urgent Care or if they have MyChart they are able to login to do a e-visit with a [facility] Provider from home.

## 2022-06-21 NOTE — Progress Notes (Unsigned)
Subjective:  Patient ID: Anne Brown, female    DOB: 1970-10-27  Age: 52 y.o. MRN: [id number]  No chief complaint on file.   HPI   Fibromyalgia: Patient is taking Cymbalta 60 mg a day.   She also has ADHD and she is here for follow up of Ritalin.      03/02/2022    9:00 AM 09/30/2020    7:48 AM 10/29/2019   11:52 PM  Depression screen PHQ 2/9  Decreased Interest 0 1 0  Down, Depressed, Hopeless 0 1 0  PHQ - 2 Score 0 2 0  Altered sleeping  0   Tired, decreased energy  3   Change in appetite  1   Feeling bad or failure about yourself   0   Trouble concentrating  2   Moving slowly or fidgety/restless  2   Suicidal thoughts  0   PHQ-9 Score  10   Difficult doing work/chores  Somewhat difficult         03/02/2022    9:00 AM  Fall Risk   Falls in the past year? 0  Number falls in past yr: 0  Injury with Fall? 0  Risk for fall due to : No Fall Risks  Follow up Falls evaluation completed    Patient Care Team: Cox, Fritzi Mandes, MD as PCP - General (Family Medicine)   Review of Systems  Current Outpatient Medications on File Prior to Visit  Medication Sig Dispense Refill   amitriptyline (ELAVIL) 100 MG tablet TAKE 1 TABLET BY MOUTH ONCE DAILY AT NIGHT 90 tablet 0   buPROPion (WELLBUTRIN SR) 150 MG 12 hr tablet TAKE 1 TABLET BY MOUTH EVERY DAY AT BEDTIME AS NEEDED 90 tablet 1   butalbital-acetaminophen-caffeine (FIORICET) 50-325-40 MG tablet TAKE 1 TO 2 TABLETS BY MOUTH EVERY 6 HOURS AS NEEDED FOR HEADACHE 30 tablet 0   cetirizine (ZYRTEC) 10 MG tablet Take 10 mg by mouth at bedtime.     clonazePAM (KLONOPIN) 0.5 MG tablet Take 1 tablet by mouth twice daily as needed for anxiety 60 tablet 5   cyclobenzaprine (FLEXERIL) 5 MG tablet Take 1 tablet by mouth three times daily as needed for muscle spasm 90 tablet 11   DULoxetine (CYMBALTA) 60 MG capsule Take 1 capsule by mouth once daily 90 capsule 0   magic mouthwash (lidocaine, diphenhydrAMINE, alum & mag hydroxide) suspension Swish  and swallow 5 mLs 3 (three) times daily. 360 mL 0   meclizine (ANTIVERT) 25 MG tablet TAKE 1 TABLET BY MOUTH EVERY 6 HOURS AS NEEDED FOR DIZZINESS 30 tablet 0   meloxicam (MOBIC) 15 MG tablet Take 1 tablet by mouth once daily 90 tablet 1   methylphenidate (RITALIN) 10 MG tablet Take 2 tablets (20 mg total) by mouth 3 (three) times daily with meals. 180 tablet 0   mirabegron ER (MYRBETRIQ) 25 MG TB24 tablet Take 1 tablet (25 mg total) by mouth daily. 30 tablet 2   nitrofurantoin, macrocrystal-monohydrate, (MACROBID) 100 MG capsule Take 1 capsule (100 mg total) by mouth 2 (two) times daily. 14 capsule 0   OnabotulinumtoxinA, Cosmetic, (BOTOX COSMETIC IM)      promethazine (PHENERGAN) 12.5 MG tablet TAKE 1 TABLET BY MOUTH EVERY 6 HOURS AS NEEDED FOR NAUSEA 30 tablet 0   Vitamin D, Ergocalciferol, (DRISDOL) 1.25 MG (50000 UNIT) CAPS capsule Take 1 capsule (50,000 Units total) by mouth 3 (three) times a week. 36 capsule 3   No current facility-administered medications on file prior to visit.  Past Medical History:  Diagnosis Date   Abnormal Pap smear    ADHD    Anxiety    Family history of adverse reaction to anesthesia    mother n/v severe   Headache    migraines   Lower back pain    LSIL (low grade squamous intraepithelial lesion) on Pap smear 10/15/2007   Past Surgical History:  Procedure Laterality Date   ANTERIOR AND POSTERIOR REPAIR N/A 06/27/2018   Procedure: ANTERIOR (CYSTOCELE) AND POSTERIOR REPAIR (RECTOCELE);  Surgeon: Osborn Coho, MD;  Location: [facility];  Service: Gynecology;  Laterality: N/A;   DILATION AND CURETTAGE OF UTERUS  1992   DILATION AND CURETTAGE OF UTERUS  2009   ECTOPIC PREGNANCY SURGERY  01/2008   After BTL   HYSTEROSCOPY WITH NOVASURE N/A 06/27/2018   Procedure: HYSTEROSCOPY WITH NOVASURE;  Surgeon: Osborn Coho, MD;  Location: [facility];  Service: Gynecology;  Laterality: N/A;   TUBAL LIGATION      Family History   Problem Relation Age of Onset   Depression Mother    Social History   Socioeconomic History   Marital status: Married    Spouse name: Not on file   Number of children: Not on file   Years of education: Not on file   Highest education level: Not on file  Occupational History   Not on file  Tobacco Use   Smoking status: Never   Smokeless tobacco: Never  Vaping Use   Vaping Use: Never used  Substance and Sexual Activity   Alcohol use: Yes    Alcohol/week: 0.0 standard drinks of alcohol    Comment: 3 glasses a week    Drug use: No   Sexual activity: Yes    Birth control/protection: None, Surgical    Comment: 1st intercourse- 17, partners-2  Other Topics Concern   Not on file  Social History Narrative   Not on file   Social Determinants of Health   Financial Resource Strain: Low Risk  (03/02/2022)   Overall Financial Resource Strain (CARDIA)    Difficulty of Paying Living Expenses: Not hard at all  Food Insecurity: No Food Insecurity (03/02/2022)   Hunger Vital Sign    Worried About Running Out of Food in the Last Year: Never true    Ran Out of Food in the Last Year: Never true  Transportation Needs: No Transportation Needs (03/02/2022)   PRAPARE - Administrator, Civil Service (Medical): No    Lack of Transportation (Non-Medical): No  Physical Activity: Insufficiently Active (03/02/2022)   Exercise Vital Sign    Days of Exercise per Week: 3 days    Minutes of Exercise per Session: 30 min  Stress: No Stress Concern Present (03/02/2022)   [employer] of Occupational Health - Occupational Stress Questionnaire    Feeling of Stress : Not at all  Social Connections: Moderately Integrated (03/02/2022)   Social Connection and Isolation Panel [NHANES]    Frequency of Communication with Friends and Family: More than three times a week    Frequency of Social Gatherings with Friends and Family: More than three times a week    Attends Religious Services: More than 4 times  per year    Active Member of [employer] or Organizations: No    Attends Banker Meetings: Never    Marital Status: Married    Objective:  There were no vitals taken for this visit.     03/14/2022   11:03 AM 03/02/2022  8:56 AM 10/31/2021   10:03 AM  BP/Weight  Systolic BP 138 124   Diastolic BP 70 74   Wt. (Lbs) 163.4 161 165  BMI 30.87 kg/m2 30.42 kg/m2 31.18 kg/m2    Physical Exam  Diabetic Foot Exam - Simple   No data filed      Lab Results  Component Value Date   WBC 5.7 07/29/2021   HGB 12.9 07/29/2021   HCT 38.4 07/29/2021   PLT 300 07/29/2021   GLUCOSE 103 (H) 07/29/2021   CHOL 141 09/19/2021   TRIG 62 09/19/2021   HDL 79 09/19/2021   LDLCALC 49 09/19/2021   ALT 17 07/29/2021   AST 20 07/29/2021   NA 140 07/29/2021   K 4.3 07/29/2021   CL 103 07/29/2021   CREATININE 0.65 07/29/2021   BUN 9 07/29/2021   CO2 23 07/29/2021   TSH 2.210 07/29/2021   INR 0.9 06/22/2020      Assessment & Plan:    Fibromyalgia  Attention deficit disorder (ADD) without hyperactivity     No orders of the defined types were placed in this encounter.   No orders of the defined types were placed in this encounter.    Follow-up: No follow-ups on file.   I,Marla I Leal-Borjas,acting as a scribe for Blane Ohara, MD.,have documented all relevant documentation on the behalf of Blane Ohara, MD,as directed by  Blane Ohara, MD while in the presence of Blane Ohara, MD.   An After Visit Summary was printed and given to the patient.  Blane Ohara, MD [facility] [phone]

## 2022-06-22 ENCOUNTER — Encounter: Payer: Self-pay | Admitting: Family Medicine

## 2022-06-22 ENCOUNTER — Ambulatory Visit: Payer: 59 | Admitting: Family Medicine

## 2022-06-22 VITALS — BP 124/72 | HR 97 | Temp 97.1°F | Ht 61.0 in | Wt 163.0 lb

## 2022-06-22 DIAGNOSIS — M797 Fibromyalgia: Secondary | ICD-10-CM

## 2022-06-22 DIAGNOSIS — R8290 Unspecified abnormal findings in urine: Secondary | ICD-10-CM | POA: Insufficient documentation

## 2022-06-22 DIAGNOSIS — N3000 Acute cystitis without hematuria: Secondary | ICD-10-CM | POA: Diagnosis not present

## 2022-06-22 DIAGNOSIS — N3941 Urge incontinence: Secondary | ICD-10-CM | POA: Diagnosis not present

## 2022-06-22 DIAGNOSIS — F988 Other specified behavioral and emotional disorders with onset usually occurring in childhood and adolescence: Secondary | ICD-10-CM | POA: Diagnosis not present

## 2022-06-22 DIAGNOSIS — M79671 Pain in right foot: Secondary | ICD-10-CM | POA: Diagnosis not present

## 2022-06-22 LAB — POCT URINALYSIS DIP (CLINITEK)
Bilirubin, UA: NEGATIVE
Blood, UA: NEGATIVE
Glucose, UA: NEGATIVE mg/dL
Ketones, POC UA: NEGATIVE mg/dL
Nitrite, UA: NEGATIVE
Spec Grav, UA: 1.025 (ref 1.010–1.025)
Urobilinogen, UA: 0.2 E.U./dL
pH, UA: 6 (ref 5.0–8.0)

## 2022-06-22 MED ORDER — METHYLPHENIDATE HCL 10 MG PO TABS: 20.0000 mg | ORAL_TABLET | Freq: Three times a day (TID) | ORAL | 0 refills | Status: DC

## 2022-06-22 MED ORDER — IBUPROFEN 800 MG PO TABS
800.0000 mg | ORAL_TABLET | Freq: Three times a day (TID) | ORAL | 0 refills | Status: DC | PRN
Start: 2022-06-22 — End: 2022-08-17

## 2022-06-22 MED ORDER — NITROFURANTOIN MONOHYD MACRO 100 MG PO CAPS
100.0000 mg | ORAL_CAPSULE | Freq: Two times a day (BID) | ORAL | 0 refills | Status: DC
Start: 2022-06-22 — End: 2022-11-29

## 2022-06-22 NOTE — Assessment & Plan Note (Signed)
Ritalin refilled

## 2022-06-22 NOTE — Progress Notes (Signed)
Acute Office Visit  Subjective:    Patient ID: Anne Brown, female    DOB: [birth year], 52 y.o.   MRN: [id number]  Chief Complaint  Patient presents with   Foot Pain    HPI: Patient is in today for right foot pain x 3-4 weeks, no known injury, some swelling and pain, throbbing, sharp and burning at times. Walking without shoes makes it worse but wearing insoles helps. Has been taking 800 mg ibuprofen TWICE DAILY. Was seen at urgent care and given a steroid injection and rx for prednisone with did help.  Patient had a last dose of prednisone today.  She was taking with ibuprofen twice a day.  In addition I discovered she was also taking the meloxicam once a day.  Patient denies any significant GI upset.  Patient is concerned because she has a strong odor to her urine at times.  She was given Macrobid 100 mg to take prior to intercourse.  She denies any other urinary symptoms other than urge incontinence.  She was initially put on oxybutynin and then Vesicare in the hopes that that would help the urge incontinence.  Although it did help the urge incontinence both cause her severe dry mouth to the point where she had she had infection of her gums recently.  She was treated by her dentist.  I had prescribed Myrbetriq however it was excessively expensive.  Past Medical History:  Diagnosis Date   Abnormal Pap smear    ADHD    Anxiety    Family history of adverse reaction to anesthesia    mother n/v severe   Headache    migraines   Lower back pain    LSIL (low grade squamous intraepithelial lesion) on Pap smear 10/15/2007    Past Surgical History:  Procedure Laterality Date   ANTERIOR AND POSTERIOR REPAIR N/A 06/27/2018   Procedure: ANTERIOR (CYSTOCELE) AND POSTERIOR REPAIR (RECTOCELE);  Surgeon: Osborn Coho, MD;  Location: [facility];  Service: Gynecology;  Laterality: N/A;   DILATION AND CURETTAGE OF UTERUS  1992   DILATION AND CURETTAGE OF UTERUS  2009   ECTOPIC  PREGNANCY SURGERY  01/2008   After BTL   HYSTEROSCOPY WITH NOVASURE N/A 06/27/2018   Procedure: HYSTEROSCOPY WITH NOVASURE;  Surgeon: Osborn Coho, MD;  Location: [facility];  Service: Gynecology;  Laterality: N/A;   TUBAL LIGATION      Family History  Problem Relation Age of Onset   Depression Mother     Social History   Socioeconomic History   Marital status: Married    Spouse name: Not on file   Number of children: Not on file   Years of education: Not on file   Highest education level: Not on file  Occupational History   Not on file  Tobacco Use   Smoking status: Never   Smokeless tobacco: Never  Vaping Use   Vaping Use: Never used  Substance and Sexual Activity   Alcohol use: Yes    Alcohol/week: 0.0 standard drinks of alcohol    Comment: 3 glasses a week    Drug use: No   Sexual activity: Yes    Birth control/protection: None, Surgical    Comment: 1st intercourse- 17, partners-2  Other Topics Concern   Not on file  Social History Narrative   Not on file   Social Determinants of Health   Financial Resource Strain: Low Risk  (03/02/2022)   Overall Financial Resource Strain (CARDIA)    Difficulty  of Paying Living Expenses: Not hard at all  Food Insecurity: No Food Insecurity (03/02/2022)   Hunger Vital Sign    Worried About Running Out of Food in the Last Year: Never true    Ran Out of Food in the Last Year: Never true  Transportation Needs: No Transportation Needs (03/02/2022)   PRAPARE - Administrator, Civil Service (Medical): No    Lack of Transportation (Non-Medical): No  Physical Activity: Insufficiently Active (03/02/2022)   Exercise Vital Sign    Days of Exercise per Week: 3 days    Minutes of Exercise per Session: 30 min  Stress: No Stress Concern Present (03/02/2022)   [employer] of Occupational Health - Occupational Stress Questionnaire    Feeling of Stress : Not at all  Social Connections: Moderately Integrated  (03/02/2022)   Social Connection and Isolation Panel [NHANES]    Frequency of Communication with Friends and Family: More than three times a week    Frequency of Social Gatherings with Friends and Family: More than three times a week    Attends Religious Services: More than 4 times per year    Active Member of [employer] or Organizations: No    Attends Banker Meetings: Never    Marital Status: Married  Catering manager Violence: Not At Risk (03/02/2022)   Humiliation, Afraid, Rape, and Kick questionnaire    Fear of Current or Ex-Partner: No    Emotionally Abused: No    Physically Abused: No    Sexually Abused: No    Outpatient Medications Prior to Visit  Medication Sig Dispense Refill   amitriptyline (ELAVIL) 100 MG tablet TAKE 1 TABLET BY MOUTH ONCE DAILY AT NIGHT 90 tablet 0   buPROPion (WELLBUTRIN SR) 150 MG 12 hr tablet TAKE 1 TABLET BY MOUTH EVERY DAY AT BEDTIME AS NEEDED 90 tablet 1   butalbital-acetaminophen-caffeine (FIORICET) 50-325-40 MG tablet TAKE 1 TO 2 TABLETS BY MOUTH EVERY 6 HOURS AS NEEDED FOR HEADACHE 30 tablet 0   cetirizine (ZYRTEC) 10 MG tablet Take 10 mg by mouth at bedtime.     clonazePAM (KLONOPIN) 0.5 MG tablet Take 1 tablet by mouth twice daily as needed for anxiety 60 tablet 5   cyclobenzaprine (FLEXERIL) 5 MG tablet Take 1 tablet by mouth three times daily as needed for muscle spasm 90 tablet 11   DULoxetine (CYMBALTA) 60 MG capsule Take 1 capsule by mouth once daily 90 capsule 0   magic mouthwash (lidocaine, diphenhydrAMINE, alum & mag hydroxide) suspension Swish and swallow 5 mLs 3 (three) times daily. 360 mL 0   meclizine (ANTIVERT) 25 MG tablet TAKE 1 TABLET BY MOUTH EVERY 6 HOURS AS NEEDED FOR DIZZINESS 30 tablet 0   mirabegron ER (MYRBETRIQ) 25 MG TB24 tablet Take 1 tablet (25 mg total) by mouth daily. 30 tablet 2   OnabotulinumtoxinA, Cosmetic, (BOTOX COSMETIC IM)      promethazine (PHENERGAN) 12.5 MG tablet TAKE 1 TABLET BY MOUTH EVERY 6 HOURS AS  NEEDED FOR NAUSEA 30 tablet 0   Vitamin D, Ergocalciferol, (DRISDOL) 1.25 MG (50000 UNIT) CAPS capsule Take 1 capsule (50,000 Units total) by mouth 3 (three) times a week. 36 capsule 3   meloxicam (MOBIC) 15 MG tablet Take 1 tablet by mouth once daily 90 tablet 1   methylphenidate (RITALIN) 10 MG tablet Take 2 tablets (20 mg total) by mouth 3 (three) times daily with meals. 180 tablet 0   nitrofurantoin, macrocrystal-monohydrate, (MACROBID) 100 MG capsule Take 1 capsule (100  mg total) by mouth 2 (two) times daily. 14 capsule 0   No facility-administered medications prior to visit.    Allergies  Allergen Reactions   Oxybutynin Other (See Comments)    Severe dry mouth and mouth ulcers    Review of Systems  Constitutional:  Negative for chills, fatigue and fever.  HENT:  Negative for congestion, ear pain, rhinorrhea and sore throat.   Respiratory:  Negative for cough and shortness of breath.   Cardiovascular:  Negative for chest pain.  Gastrointestinal:  Negative for abdominal pain, constipation, diarrhea, nausea and vomiting.  Genitourinary:  Positive for urgency. Negative for dysuria.       Urinary incontinenc.e  Musculoskeletal:  Negative for back pain and myalgias.       Right foot pain  Neurological:  Negative for dizziness, weakness, light-headedness and headaches.  Psychiatric/Behavioral:  Negative for dysphoric mood. The patient is not nervous/anxious.        Objective:        06/22/2022    8:01 AM 03/14/2022   11:03 AM 03/02/2022    8:56 AM  Vitals with BMI  Height 5\' 1"  5\' 1"  5\' 1"   Weight 163 lbs 163 lbs 6 oz 161 lbs  BMI 30.81 30.89 30.44  Systolic 124 138 295  Diastolic 72 70 74  Pulse 97 86 90    No data found.   Physical Exam Vitals reviewed.  Constitutional:      Appearance: Normal appearance.  Neck:     Vascular: No carotid bruit.  Cardiovascular:     Rate and Rhythm: Normal rate and regular rhythm.     Heart sounds: Normal heart sounds.  Pulmonary:      Effort: Pulmonary effort is normal. No respiratory distress.     Breath sounds: Normal breath sounds.  Abdominal:     Palpations: Abdomen is soft.     Tenderness: There is no abdominal tenderness.  Musculoskeletal:        General: Tenderness (mild plantar rt foot. nontender over the rest of her foot. (her pain at urgent care was metatarsal and a burning pain which has resolved.)) present.  Neurological:     Mental Status: She is alert and oriented to person, place, and time.  Psychiatric:        Mood and Affect: Mood normal.        Behavior: Behavior normal.     Health Maintenance Due  Topic Date Due   DTaP/Tdap/Td (1 - Tdap) Never done   Colonoscopy  Never done   MAMMOGRAM  05/14/2021   Zoster Vaccines- Shingrix (1 of 2) Never done   COVID-19 Vaccine (5 - 2023-24 season) 09/23/2021    There are no preventive care reminders to display for this patient.   Lab Results  Component Value Date   TSH 2.210 07/29/2021   Lab Results  Component Value Date   WBC 5.7 07/29/2021   HGB 12.9 07/29/2021   HCT 38.4 07/29/2021   MCV 92 07/29/2021   PLT 300 07/29/2021   Lab Results  Component Value Date   NA 140 07/29/2021   K 4.3 07/29/2021   CO2 23 07/29/2021   GLUCOSE 103 (H) 07/29/2021   BUN 9 07/29/2021   CREATININE 0.65 07/29/2021   BILITOT 0.3 07/29/2021   ALKPHOS 93 07/29/2021   AST 20 07/29/2021   ALT 17 07/29/2021   PROT 6.7 07/29/2021   ALBUMIN 4.3 07/29/2021   CALCIUM 9.6 07/29/2021   ANIONGAP 7 06/24/2018   EGFR 107  07/29/2021   Lab Results  Component Value Date   CHOL 141 09/19/2021   Lab Results  Component Value Date   HDL 79 09/19/2021   Lab Results  Component Value Date   LDLCALC 49 09/19/2021   Lab Results  Component Value Date   TRIG 62 09/19/2021   Lab Results  Component Value Date   CHOLHDL 1.8 09/19/2021   No results found for: "HGBA1C"     Assessment & Plan:  Attention deficit disorder (ADD) without hyperactivity Assessment &  Plan: Ritalin refilled.   Orders: -     Methylphenidate HCl; Take 2 tablets (20 mg total) by mouth 3 (three) times daily with meals.  Dispense: 180 tablet; Refill: 0  Malodorous urine Assessment & Plan: Check ua.   Orders: -     POCT URINALYSIS DIP (CLINITEK) -     Urine Culture  Acute cystitis without hematuria Assessment & Plan: Macrobid 100 mg twice daily x 7 days. If persistent symptoms, I will send prescription for macrobid 100 mg daily x 3 months for prevention.   Orders: -     Nitrofurantoin Monohyd Macro; Take 1 capsule (100 mg total) by mouth 2 (two) times daily.  Dispense: 14 capsule; Refill: 0  Right foot pain Assessment & Plan: Sent ibuprofen 800 mg three times a day as needed for foot pain. Call back for podiatry referral if worsening. Do not take meloxicam with ibuprofen.  Orders: -     Ibuprofen; Take 1 tablet (800 mg total) by mouth every 8 (eight) hours as needed.  Dispense: 90 tablet; Refill: 0  Urge incontinence Assessment & Plan: Start on gemtesa once daily.       Meds ordered this encounter  Medications   methylphenidate (RITALIN) 10 MG tablet    Sig: Take 2 tablets (20 mg total) by mouth 3 (three) times daily with meals.    Dispense:  180 tablet    Refill:  0   nitrofurantoin, macrocrystal-monohydrate, (MACROBID) 100 MG capsule    Sig: Take 1 capsule (100 mg total) by mouth 2 (two) times daily.    Dispense:  14 capsule    Refill:  0   ibuprofen (ADVIL) 800 MG tablet    Sig: Take 1 tablet (800 mg total) by mouth every 8 (eight) hours as needed.    Dispense:  90 tablet    Refill:  0    Orders Placed This Encounter  Procedures   Urine Culture   POCT URINALYSIS DIP (CLINITEK)     Follow-up: No follow-ups on file.  An After Visit Summary was printed and given to the patient.  Blane Ohara, MD [facility] [phone]

## 2022-06-22 NOTE — Patient Instructions (Addendum)
Start on gemtesa once daily.   Macrobid 100 mg twice daily x 7 days. If persistent symptoms, I will send prescription for macrobid 100 mg daily x 3 months for prevention.   Sent ibuprofen 800 mg three times a day as needed for foot pain. Call back for podiatry referral if worsening. Do not take meloxicam with ibuprofen.  Ritalin refilled.

## 2022-06-22 NOTE — Assessment & Plan Note (Signed)
Check ua

## 2022-06-22 NOTE — Assessment & Plan Note (Signed)
Start on gemtesa once daily.

## 2022-06-22 NOTE — Assessment & Plan Note (Signed)
Macrobid 100 mg twice daily x 7 days. If persistent symptoms, I will send prescription for macrobid 100 mg daily x 3 months for prevention.

## 2022-06-22 NOTE — Assessment & Plan Note (Signed)
Sent ibuprofen 800 mg three times a day as needed for foot pain. Call back for podiatry referral if worsening. Do not take meloxicam with ibuprofen.

## 2022-06-23 LAB — URINE CULTURE

## 2022-06-28 ENCOUNTER — Other Ambulatory Visit: Payer: Self-pay

## 2022-06-28 ENCOUNTER — Encounter: Payer: Self-pay | Admitting: Family Medicine

## 2022-06-28 MED ORDER — FLUCONAZOLE 150 MG PO TABS
150.0000 mg | ORAL_TABLET | Freq: Every day | ORAL | 0 refills | Status: DC
Start: 2022-06-28 — End: 2022-11-29

## 2022-06-29 ENCOUNTER — Other Ambulatory Visit: Payer: Self-pay | Admitting: Family Medicine

## 2022-06-29 DIAGNOSIS — M797 Fibromyalgia: Secondary | ICD-10-CM

## 2022-07-11 ENCOUNTER — Telehealth: Payer: Self-pay

## 2022-07-11 NOTE — Telephone Encounter (Signed)
Patient called stating that for the past 3 days she has been unable to tolerate or keep under control her joint pain. She states that she has normally kept it under control with the Ibuprofen and the muscle relaxer's. She states that her joints have been swelled up and hard to bend especially her wrists. She is wanting to get in to see you but we have no available appointments. She also questioned if this is happening since she had to discontinue her Meloxicam due to taking the Ibuprofen? Please advise.

## 2022-07-11 NOTE — Telephone Encounter (Signed)
Patient informed, and patient states that she will call back to schedule appointment if she is not any better.

## 2022-07-12 MED ORDER — PREDNISONE 50 MG PO TABS: 50.0000 mg | ORAL_TABLET | Freq: Every day | ORAL | 0 refills | Status: DC

## 2022-07-14 ENCOUNTER — Ambulatory Visit: Payer: 59 | Admitting: Physician Assistant

## 2022-07-15 ENCOUNTER — Encounter: Payer: Self-pay | Admitting: Family Medicine

## 2022-07-24 ENCOUNTER — Other Ambulatory Visit: Payer: Self-pay

## 2022-07-24 DIAGNOSIS — F988 Other specified behavioral and emotional disorders with onset usually occurring in childhood and adolescence: Secondary | ICD-10-CM

## 2022-07-24 MED ORDER — METHYLPHENIDATE HCL 10 MG PO TABS
20.0000 mg | ORAL_TABLET | Freq: Three times a day (TID) | ORAL | 0 refills | Status: DC
Start: 2022-07-24 — End: 2022-08-24

## 2022-08-16 NOTE — Assessment & Plan Note (Signed)
Ritalin refilled

## 2022-08-16 NOTE — Progress Notes (Signed)
CANCELED

## 2022-08-16 NOTE — Assessment & Plan Note (Signed)
Continue duloxetine 20 mg 2 twice daily.

## 2022-08-17 ENCOUNTER — Other Ambulatory Visit: Payer: Self-pay | Admitting: Family Medicine

## 2022-08-17 ENCOUNTER — Encounter: Payer: Self-pay | Admitting: Family Medicine

## 2022-08-17 DIAGNOSIS — G43009 Migraine without aura, not intractable, without status migrainosus: Secondary | ICD-10-CM

## 2022-08-17 DIAGNOSIS — M79671 Pain in right foot: Secondary | ICD-10-CM

## 2022-08-24 ENCOUNTER — Other Ambulatory Visit: Payer: Self-pay

## 2022-08-24 DIAGNOSIS — F988 Other specified behavioral and emotional disorders with onset usually occurring in childhood and adolescence: Secondary | ICD-10-CM

## 2022-08-24 MED ORDER — METHYLPHENIDATE HCL 10 MG PO TABS: 20.0000 mg | ORAL_TABLET | Freq: Three times a day (TID) | ORAL | 0 refills | Status: DC

## 2022-08-28 ENCOUNTER — Ambulatory Visit (INDEPENDENT_AMBULATORY_CARE_PROVIDER_SITE_OTHER): Payer: 59 | Admitting: Family Medicine

## 2022-08-28 VITALS — BP 120/70 | HR 80 | Temp 97.1°F | Resp 16 | Ht 61.0 in | Wt 169.4 lb

## 2022-08-28 DIAGNOSIS — M545 Low back pain, unspecified: Secondary | ICD-10-CM | POA: Diagnosis not present

## 2022-08-28 DIAGNOSIS — M79642 Pain in left hand: Secondary | ICD-10-CM

## 2022-08-28 DIAGNOSIS — F330 Major depressive disorder, recurrent, mild: Secondary | ICD-10-CM | POA: Diagnosis not present

## 2022-08-28 DIAGNOSIS — M79641 Pain in right hand: Secondary | ICD-10-CM

## 2022-08-28 DIAGNOSIS — F988 Other specified behavioral and emotional disorders with onset usually occurring in childhood and adolescence: Secondary | ICD-10-CM | POA: Diagnosis not present

## 2022-08-28 DIAGNOSIS — E559 Vitamin D deficiency, unspecified: Secondary | ICD-10-CM

## 2022-08-28 DIAGNOSIS — G8929 Other chronic pain: Secondary | ICD-10-CM | POA: Diagnosis not present

## 2022-08-28 DIAGNOSIS — M797 Fibromyalgia: Secondary | ICD-10-CM

## 2022-08-28 MED ORDER — DULOXETINE HCL 60 MG PO CPEP: 60.0000 mg | ORAL_CAPSULE | Freq: Two times a day (BID) | ORAL | 1 refills | Status: DC

## 2022-08-28 NOTE — Patient Instructions (Signed)
Increase cymbalta 60 mg twice daily.  Lab draw today.

## 2022-08-28 NOTE — Progress Notes (Signed)
Subjective:  Patient ID: Anne Brown, female    DOB: February 20, 1970  Age: 52 y.o. MRN: [id number]  Chief Complaint  Patient presents with   Medical Management of Chronic Issues    HPI Patient presents complaining of significant fatigue and muscle and joint pains.  She is very discouraged because she feels that she is just doing worse.  Patient is also complaining of low back pain.  This is a chronic issue.  She has seen [facility] in the past.  She was disappointed because currently nobody would call her back when she called.  Fibromyalgia: Patient taking amitriptyline 100 mg nightly and Cymbalta 60 mg daily.   She also has ADHD and she is here for follow up of Ritalin 20 mg three times daily.  She does not feel that her concentration has not been good.       08/28/2022    2:47 PM 06/22/2022    8:09 AM 03/02/2022    9:00 AM 09/30/2020    7:48 AM 10/29/2019   11:52 PM  Depression screen PHQ 2/9  Decreased Interest 0 0 0 1 0  Down, Depressed, Hopeless 1 1 0 1 0  PHQ - 2 Score 1 1 0 2 0  Altered sleeping 0 0  0   Tired, decreased energy 2 3  3    Change in appetite 2 1  1    Feeling bad or failure about yourself  2 1  0   Trouble concentrating 3 3  2    Moving slowly or fidgety/restless 1 0  2   Suicidal thoughts 0 0  0   PHQ-9 Score 11 9  10    Difficult doing work/chores Very difficult Somewhat difficult  Somewhat difficult         08/28/2022    2:47 PM  Fall Risk   Falls in the past year? 0  Number falls in past yr: 0  Injury with Fall? 0  Risk for fall due to : No Fall Risks  Follow up Falls evaluation completed;Follow up appointment    Patient Care Team: Blane Ohara, MD as PCP - General (Family Medicine)   Review of Systems  Constitutional:  Positive for fatigue. Negative for chills and fever.  HENT:  Negative for congestion, rhinorrhea and sore throat.   Respiratory:  Negative for cough and shortness of breath.   Cardiovascular:  Negative for chest pain.   Gastrointestinal:  Negative for abdominal pain, constipation, diarrhea, nausea and vomiting.  Genitourinary:  Negative for dysuria and urgency.  Musculoskeletal:  Positive for myalgias. Negative for back pain.  Neurological:  Positive for headaches. Negative for dizziness, weakness and light-headedness.  Psychiatric/Behavioral:  Negative for dysphoric mood. The patient is not nervous/anxious.     Current Outpatient Medications on File Prior to Visit  Medication Sig Dispense Refill   amitriptyline (ELAVIL) 100 MG tablet TAKE 1 TABLET BY MOUTH ONCE DAILY AT NIGHT 90 tablet 0   butalbital-acetaminophen-caffeine (FIORICET) 50-325-40 MG tablet TAKE 1 TO 2 TABLETS BY MOUTH EVERY 6 HOURS AS NEEDED FOR HEADACHE 30 tablet 2   cetirizine (ZYRTEC) 10 MG tablet Take 10 mg by mouth at bedtime.     clonazePAM (KLONOPIN) 0.5 MG tablet Take 1 tablet by mouth twice daily as needed for anxiety 60 tablet 5   cyclobenzaprine (FLEXERIL) 5 MG tablet Take 1 tablet by mouth three times daily as needed for muscle spasm 90 tablet 11   fluconazole (DIFLUCAN) 150 MG tablet Take 1 tablet (150 mg total) by mouth  daily. 1 tablet 0   ibuprofen (ADVIL) 800 MG tablet TAKE 1 TABLET BY MOUTH EVERY 8 HOURS AS NEEDED 180 tablet 1   meclizine (ANTIVERT) 25 MG tablet TAKE 1 TABLET BY MOUTH EVERY 6 HOURS AS NEEDED FOR DIZZINESS 30 tablet 0   methylphenidate (RITALIN) 10 MG tablet Take 2 tablets (20 mg total) by mouth 3 (three) times daily with meals. 180 tablet 0   mirabegron ER (MYRBETRIQ) 25 MG TB24 tablet Take 1 tablet (25 mg total) by mouth daily. 30 tablet 2   nitrofurantoin, macrocrystal-monohydrate, (MACROBID) 100 MG capsule Take 1 capsule (100 mg total) by mouth 2 (two) times daily. 14 capsule 0   OnabotulinumtoxinA, Cosmetic, (BOTOX COSMETIC IM)      promethazine (PHENERGAN) 12.5 MG tablet TAKE 1 TABLET BY MOUTH EVERY 6 HOURS AS NEEDED FOR NAUSEA 30 tablet 0   Vitamin D, Ergocalciferol, (DRISDOL) 1.25 MG (50000 UNIT) CAPS  capsule Take 1 capsule (50,000 Units total) by mouth 3 (three) times a week. 36 capsule 3   No current facility-administered medications on file prior to visit.   Past Medical History:  Diagnosis Date   Abnormal Pap smear    ADHD    Anxiety    Family history of adverse reaction to anesthesia    mother n/v severe   Headache    migraines   Lower back pain    LSIL (low grade squamous intraepithelial lesion) on Pap smear 10/15/2007   Past Surgical History:  Procedure Laterality Date   ANTERIOR AND POSTERIOR REPAIR N/A 06/27/2018   Procedure: ANTERIOR (CYSTOCELE) AND POSTERIOR REPAIR (RECTOCELE);  Surgeon: Osborn Coho, MD;  Location: [facility];  Service: Gynecology;  Laterality: N/A;   DILATION AND CURETTAGE OF UTERUS  1992   DILATION AND CURETTAGE OF UTERUS  2009   ECTOPIC PREGNANCY SURGERY  01/2008   After BTL   HYSTEROSCOPY WITH NOVASURE N/A 06/27/2018   Procedure: HYSTEROSCOPY WITH NOVASURE;  Surgeon: Osborn Coho, MD;  Location[facility]  Service: Gynecology;  Laterality: N/A;   TUBAL LIGATION      Family History  Problem Relation Age of Onset   Depression Mother    Social History   Socioeconomic History   Marital status: Married    Spouse name: Not on file   Number of children: Not on file   Years of education: Not on file   Highest education level: GED or equivalent  Occupational History   Not on file  Tobacco Use   Smoking status: Never   Smokeless tobacco: Never  Vaping Use   Vaping status: Never Used  Substance and Sexual Activity   Alcohol use: Yes    Alcohol/week: 0.0 standard drinks of alcohol    Comment: 3 glasses a week    Drug use: No   Sexual activity: Yes    Birth control/protection: None, Surgical    Comment: 1st intercourse- 17, partners-2  Other Topics Concern   Not on file  Social History Narrative   Not on file   Social Determinants of Health   Financial Resource Strain: Low Risk  (08/28/2022)    Overall Financial Resource Strain (CARDIA)    Difficulty of Paying Living Expenses: Not hard at all  Food Insecurity: No Food Insecurity (08/28/2022)   Hunger Vital Sign    Worried About Running Out of Food in the Last Year: Never true    Ran Out of Food in the Last Year: Never true  Transportation Needs: No Transportation Needs (08/28/2022)  PRAPARE - Administrator, Civil Service (Medical): No    Lack of Transportation (Non-Medical): No  Physical Activity: Sufficiently Active (08/28/2022)   Exercise Vital Sign    Days of Exercise per Week: 3 days    Minutes of Exercise per Session: 50 min  Stress: No Stress Concern Present (08/28/2022)   [employer] of Occupational Health - Occupational Stress Questionnaire    Feeling of Stress : Not at all  Social Connections: Socially Integrated (08/28/2022)   Social Connection and Isolation Panel [NHANES]    Frequency of Communication with Friends and Family: Three times a week    Frequency of Social Gatherings with Friends and Family: Three times a week    Attends Religious Services: More than 4 times per year    Active Member of Clubs or Organizations: Yes    Attends Engineer, structural: More than 4 times per year    Marital Status: Married    Objective:  BP 120/70   Pulse 80   Temp (!) 97.1 F (36.2 C)   Resp 16   Ht 5\' 1"  (1.549 m)   Wt 169 lb 6.4 oz (76.8 kg)   BMI 32.01 kg/m      08/28/2022    2:36 PM 06/22/2022    8:01 AM 03/14/2022   11:03 AM  BP/Weight  Systolic BP 120 124 138  Diastolic BP 70 72 70  Wt. (Lbs) 169.4 163 163.4  BMI 32.01 kg/m2 30.8 kg/m2 30.87 kg/m2    Physical Exam Vitals reviewed.  Constitutional:      Appearance: Normal appearance. She is normal weight.  Neck:     Vascular: No carotid bruit.  Cardiovascular:     Rate and Rhythm: Normal rate and regular rhythm.     Heart sounds: Normal heart sounds.  Pulmonary:     Effort: Pulmonary effort is normal. No respiratory distress.      Breath sounds: Normal breath sounds.  Abdominal:     General: Abdomen is flat. Bowel sounds are normal.     Palpations: Abdomen is soft.     Tenderness: There is no abdominal tenderness.  Musculoskeletal:        General: Tenderness present.  Neurological:     Mental Status: She is alert and oriented to person, place, and time.  Psychiatric:        Mood and Affect: Mood normal.        Behavior: Behavior normal.     Diabetic Foot Exam - Simple   No data filed      Lab Results  Component Value Date   WBC 5.7 07/29/2021   HGB 12.9 07/29/2021   HCT 38.4 07/29/2021   PLT 300 07/29/2021   GLUCOSE 103 (H) 07/29/2021   CHOL 141 09/19/2021   TRIG 62 09/19/2021   HDL 79 09/19/2021   LDLCALC 49 09/19/2021   ALT 17 07/29/2021   AST 20 07/29/2021   NA 140 07/29/2021   K 4.3 07/29/2021   CL 103 07/29/2021   CREATININE 0.65 07/29/2021   BUN 9 07/29/2021   CO2 23 07/29/2021   TSH 2.210 07/29/2021   INR 0.9 06/22/2020      Assessment & Plan:    Attention deficit disorder (ADD) without hyperactivity  Mild recurrent major depression (HCC)  Fibromyalgia  Vitamin D deficiency -     CMP14+EGFR -     CBC with Differential/Platelet -     TSH -     T4, free -  VITAMIN D 25 Hydroxy (Vit-D Deficiency, Fractures) -     Vitamin B12  Bilateral hand pain -     Rheumatoid factor -     CYCLIC CITRUL PEPTIDE ANTIBODY, IGG/IGA -     Sedimentation rate -     C-reactive protein -     ANA w/Reflex  Chronic midline low back pain, unspecified whether sciatica present  Other orders -     DULoxetine HCl; Take 1 capsule (60 mg total) by mouth 2 (two) times daily.  Dispense: 180 capsule; Refill: 1     Meds ordered this encounter  Medications   DULoxetine (CYMBALTA) 60 MG capsule    Sig: Take 1 capsule (60 mg total) by mouth 2 (two) times daily.    Dispense:  180 capsule    Refill:  1    Orders Placed This Encounter  Procedures   CMP14+EGFR   CBC with Differential/Platelet    TSH   T4, Free   Vitamin D, 25-hydroxy   Vitamin B12   Rheumatoid factor   CYCLIC CITRUL PEPTIDE ANTIBODY, IGG/IGA   Sedimentation rate   C-reactive protein   ANA w/Reflex     Follow-up: No follow-ups on file.   I,Carolyn M Morrison,acting as a Neurosurgeon for Blane Ohara, MD.,have documented all relevant documentation on the behalf of Blane Ohara, MD,as directed by  Blane Ohara, MD while in the presence of Blane Ohara, MD.   An After Visit Summary was printed and given to the patient.  Blane Ohara, MD [facility] [phone]

## 2022-08-31 ENCOUNTER — Encounter: Payer: Self-pay | Admitting: Family Medicine

## 2022-08-31 NOTE — Assessment & Plan Note (Signed)
Check vitamin D.  Came back actually quite high.  Recommended decrease vitamin D to once weekly

## 2022-08-31 NOTE — Assessment & Plan Note (Signed)
Check labs to rule out rheumatoid arthritis.  Consider referral to rheumatology regardless.

## 2022-08-31 NOTE — Assessment & Plan Note (Addendum)
Chronic.  Worsening.  Previously saw [facility] and had x-rays there.  She has been unable to reach them.  Will order an MRI of the lumbar spine and refer to [facility].

## 2022-08-31 NOTE — Assessment & Plan Note (Signed)
Worsening.  More discouraged by chronic pain she is having.

## 2022-08-31 NOTE — Assessment & Plan Note (Signed)
Ritalin refilled.  Well-controlled.

## 2022-08-31 NOTE — Assessment & Plan Note (Signed)
Increase cymbalta 60 mg twice daily.  Lab draw today.

## 2022-09-01 ENCOUNTER — Other Ambulatory Visit: Payer: Self-pay | Admitting: Family Medicine

## 2022-09-27 ENCOUNTER — Other Ambulatory Visit: Payer: Self-pay

## 2022-09-27 DIAGNOSIS — F988 Other specified behavioral and emotional disorders with onset usually occurring in childhood and adolescence: Secondary | ICD-10-CM

## 2022-09-27 MED ORDER — METHYLPHENIDATE HCL 10 MG PO TABS
20.0000 mg | ORAL_TABLET | Freq: Three times a day (TID) | ORAL | 0 refills | Status: DC
Start: 2022-09-27 — End: 2022-10-26

## 2022-10-08 ENCOUNTER — Other Ambulatory Visit: Payer: 59

## 2022-10-13 ENCOUNTER — Ambulatory Visit (INDEPENDENT_AMBULATORY_CARE_PROVIDER_SITE_OTHER): Payer: 59

## 2022-10-13 DIAGNOSIS — Z23 Encounter for immunization: Secondary | ICD-10-CM | POA: Diagnosis not present

## 2022-10-22 ENCOUNTER — Other Ambulatory Visit: Payer: Self-pay | Admitting: Family Medicine

## 2022-10-26 ENCOUNTER — Telehealth: Payer: Self-pay

## 2022-10-26 ENCOUNTER — Other Ambulatory Visit: Payer: Self-pay

## 2022-10-26 DIAGNOSIS — F988 Other specified behavioral and emotional disorders with onset usually occurring in childhood and adolescence: Secondary | ICD-10-CM

## 2022-10-26 NOTE — Telephone Encounter (Signed)
Prescription Request  10/26/2022   What is the name of the medication or equipment?  methylphenidate (RITALIN) 10 MG tablet  Have you contacted your pharmacy to request a refill? No   Which pharmacy would you like this sent to?   [id number] - [place], [place] - [address] AT [address] OF [address] [place] [address] Phone: [phone] Fax: [phone]   Patient notified that their request is being sent to the clinical staff for review and that they should receive a response within 2 business days.   Please advise at [facility] [phone]

## 2022-10-27 MED ORDER — METHYLPHENIDATE HCL 10 MG PO TABS
20.0000 mg | ORAL_TABLET | Freq: Three times a day (TID) | ORAL | 0 refills | Status: DC
Start: 2022-10-27 — End: 2022-11-29

## 2022-10-30 NOTE — Telephone Encounter (Signed)
Sent. Dr. Tykeisha Peer  

## 2022-11-01 ENCOUNTER — Other Ambulatory Visit: Payer: Self-pay | Admitting: Family Medicine

## 2022-11-01 DIAGNOSIS — F330 Major depressive disorder, recurrent, mild: Secondary | ICD-10-CM

## 2022-11-07 ENCOUNTER — Other Ambulatory Visit: Payer: Self-pay | Admitting: Family Medicine

## 2022-11-07 ENCOUNTER — Other Ambulatory Visit: Payer: Self-pay

## 2022-11-07 DIAGNOSIS — F330 Major depressive disorder, recurrent, mild: Secondary | ICD-10-CM

## 2022-11-07 DIAGNOSIS — G43009 Migraine without aura, not intractable, without status migrainosus: Secondary | ICD-10-CM

## 2022-11-07 MED ORDER — PROMETHAZINE HCL 12.5 MG PO TABS
ORAL_TABLET | ORAL | 0 refills | Status: DC
Start: 2022-11-07 — End: 2022-11-29

## 2022-11-09 ENCOUNTER — Other Ambulatory Visit: Payer: Self-pay | Admitting: Family Medicine

## 2022-11-10 ENCOUNTER — Other Ambulatory Visit: Payer: Self-pay

## 2022-11-10 MED ORDER — BUPROPION HCL ER (SR) 150 MG PO TB12: 150.0000 mg | ORAL_TABLET | Freq: Every evening | ORAL | 1 refills | Status: DC | PRN

## 2022-11-10 MED ORDER — MELOXICAM 15 MG PO TABS: 15.0000 mg | ORAL_TABLET | Freq: Every day | ORAL | 1 refills | Status: DC

## 2022-11-29 ENCOUNTER — Ambulatory Visit: Payer: 59 | Admitting: Family Medicine

## 2022-11-29 ENCOUNTER — Encounter: Payer: Self-pay | Admitting: Family Medicine

## 2022-11-29 VITALS — BP 130/70 | HR 92 | Temp 97.2°F | Ht 61.0 in | Wt 162.0 lb

## 2022-11-29 DIAGNOSIS — F330 Major depressive disorder, recurrent, mild: Secondary | ICD-10-CM | POA: Diagnosis not present

## 2022-11-29 DIAGNOSIS — M797 Fibromyalgia: Secondary | ICD-10-CM | POA: Diagnosis not present

## 2022-11-29 DIAGNOSIS — M5441 Lumbago with sciatica, right side: Secondary | ICD-10-CM | POA: Diagnosis not present

## 2022-11-29 DIAGNOSIS — F988 Other specified behavioral and emotional disorders with onset usually occurring in childhood and adolescence: Secondary | ICD-10-CM | POA: Diagnosis not present

## 2022-11-29 DIAGNOSIS — M79671 Pain in right foot: Secondary | ICD-10-CM | POA: Diagnosis not present

## 2022-11-29 DIAGNOSIS — E559 Vitamin D deficiency, unspecified: Secondary | ICD-10-CM | POA: Diagnosis not present

## 2022-11-29 DIAGNOSIS — G43009 Migraine without aura, not intractable, without status migrainosus: Secondary | ICD-10-CM | POA: Diagnosis not present

## 2022-11-29 MED ORDER — CLONAZEPAM 0.5 MG PO TABS
0.5000 mg | ORAL_TABLET | Freq: Two times a day (BID) | ORAL | 3 refills | Status: DC | PRN
Start: 2022-11-29 — End: 2023-03-30

## 2022-11-29 MED ORDER — METHYLPHENIDATE HCL 10 MG PO TABS: 20.0000 mg | ORAL_TABLET | Freq: Three times a day (TID) | ORAL | 0 refills | Status: DC

## 2022-11-29 MED ORDER — VITAMIN D (ERGOCALCIFEROL) 1.25 MG (50000 UNIT) PO CAPS: 50000.0000 [IU] | ORAL_CAPSULE | ORAL | 3 refills | Status: DC

## 2022-11-29 MED ORDER — CYCLOBENZAPRINE HCL 5 MG PO TABS
5.0000 mg | ORAL_TABLET | Freq: Three times a day (TID) | ORAL | 11 refills | Status: DC | PRN
Start: 2022-11-29 — End: 2023-03-30

## 2022-11-29 MED ORDER — PROMETHAZINE HCL 12.5 MG PO TABS: ORAL_TABLET | ORAL | 2 refills | Status: DC

## 2022-11-29 MED ORDER — BUPROPION HCL ER (SR) 150 MG PO TB12: 150.0000 mg | ORAL_TABLET | Freq: Every evening | ORAL | 5 refills | Status: DC | PRN

## 2022-11-29 MED ORDER — TOPIRAMATE 25 MG PO TABS: ORAL_TABLET | ORAL | 0 refills | Status: DC

## 2022-11-29 MED ORDER — SUMATRIPTAN SUCCINATE 50 MG PO TABS: ORAL_TABLET | ORAL | 1 refills | Status: AC

## 2022-11-29 MED ORDER — MECLIZINE HCL 25 MG PO TABS: 25.0000 mg | ORAL_TABLET | Freq: Four times a day (QID) | ORAL | 0 refills | Status: DC | PRN

## 2022-11-29 MED ORDER — DULOXETINE HCL 60 MG PO CPEP
60.0000 mg | ORAL_CAPSULE | Freq: Two times a day (BID) | ORAL | 5 refills | Status: DC
Start: 2022-11-29 — End: 2023-03-30

## 2022-11-29 MED ORDER — IBUPROFEN 800 MG PO TABS
800.0000 mg | ORAL_TABLET | Freq: Three times a day (TID) | ORAL | 2 refills | Status: AC | PRN
Start: 2022-11-29 — End: ?

## 2022-11-29 MED ORDER — AMITRIPTYLINE HCL 100 MG PO TABS
100.0000 mg | ORAL_TABLET | Freq: Every day | ORAL | 5 refills | Status: DC
Start: 2022-11-29 — End: 2023-03-30

## 2022-11-29 MED ORDER — BUTALBITAL-APAP-CAFFEINE 50-325-40 MG PO TABS: ORAL_TABLET | ORAL | 2 refills | Status: DC

## 2022-11-29 NOTE — Progress Notes (Signed)
Subjective:  Patient ID: Anne Brown, female    DOB: 12-23-1970  Age: 52 y.o. MRN: [id number]  Chief Complaint  Patient presents with   Medical Management of Chronic Issues    HPI Fibromyalgia: Patient taking amitriptyline 100 mg nightly and Cymbalta 60 mg twice daily. Cyclobenzaprine 5 mg three times a day as needed.    She also has ADHD and she is here for follow up of Ritalin 20 mg three times daily.  Her concentration has been fairly good.  Depression: Wellbutrin 150 mg before bed as needed, cymbalta 60 mg twice daily.   Migraines: 2-3 times per week. Uncontrolled.      11/29/2022    3:39 PM 08/28/2022    2:47 PM 06/22/2022    8:09 AM 03/02/2022    9:00 AM 09/30/2020    7:48 AM  Depression screen PHQ 2/9  Decreased Interest 0 0 0 0 1  Down, Depressed, Hopeless 0 1 1 0 1  PHQ - 2 Score 0 1 1 0 2  Altered sleeping 0 0 0  0  Tired, decreased energy 1 2 3  3   Change in appetite 0 2 1  1   Feeling bad or failure about yourself  0 2 1  0  Trouble concentrating 2 3 3  2   Moving slowly or fidgety/restless 1 1 0  2  Suicidal thoughts 0 0 0  0  PHQ-9 Score 4 11 9  10   Difficult doing work/chores Not difficult at all Very difficult Somewhat difficult  Somewhat difficult        11/29/2022    3:39 PM  Fall Risk   Falls in the past year? 0  Number falls in past yr: 0  Injury with Fall? 0  Risk for fall due to : No Fall Risks  Follow up Falls evaluation completed    Patient Care Team: Blane Ohara, MD as PCP - General (Family Medicine) Osborn Coho, MD as Consulting Physician (Obstetrics and Gynecology)   Review of Systems  Constitutional:  Negative for chills, fatigue and fever.  HENT:  Negative for congestion, ear pain, rhinorrhea and sore throat.   Respiratory:  Negative for cough and shortness of breath.   Cardiovascular:  Negative for chest pain.  Gastrointestinal:  Negative for abdominal pain, constipation, diarrhea, nausea and vomiting.  Genitourinary:  Negative for  dysuria and urgency.  Musculoskeletal:  Negative for back pain and myalgias.  Neurological:  Negative for dizziness, weakness, light-headedness and headaches.  Psychiatric/Behavioral:  Negative for dysphoric mood. The patient is not nervous/anxious.     Current Outpatient Medications on File Prior to Visit  Medication Sig Dispense Refill   cetirizine (ZYRTEC) 10 MG tablet Take 10 mg by mouth at bedtime.     meloxicam (MOBIC) 15 MG tablet Take 1 tablet (15 mg total) by mouth daily. 90 tablet 1   OnabotulinumtoxinA, Cosmetic, (BOTOX COSMETIC IM)      No current facility-administered medications on file prior to visit.   Past Medical History:  Diagnosis Date   Abnormal Pap smear    ADHD    Anxiety    Family history of adverse reaction to anesthesia    mother n/v severe   Headache    migraines   Lower back pain    LSIL (low grade squamous intraepithelial lesion) on Pap smear 10/15/2007   Past Surgical History:  Procedure Laterality Date   ANTERIOR AND POSTERIOR REPAIR N/A 06/27/2018   Procedure: ANTERIOR (CYSTOCELE) AND POSTERIOR REPAIR (RECTOCELE);  Surgeon:  Osborn Coho, MD;  Location: [facility];  Service: Gynecology;  Laterality: N/A;   DILATION AND CURETTAGE OF UTERUS  1992   DILATION AND CURETTAGE OF UTERUS  2009   ECTOPIC PREGNANCY SURGERY  01/2008   After BTL   HYSTEROSCOPY WITH NOVASURE N/A 06/27/2018   Procedure: HYSTEROSCOPY WITH NOVASURE;  Surgeon: Osborn Coho, MD;  Location: [facility];  Service: Gynecology;  Laterality: N/A;   TUBAL LIGATION      Family History  Problem Relation Age of Onset   Depression Mother    Social History   Socioeconomic History   Marital status: Married    Spouse name: Not on file   Number of children: Not on file   Years of education: Not on file   Highest education level: GED or equivalent  Occupational History   Not on file  Tobacco Use   Smoking status: Never   Smokeless tobacco: Never   Vaping Use   Vaping status: Never Used  Substance and Sexual Activity   Alcohol use: Yes    Alcohol/week: 0.0 standard drinks of alcohol    Comment: 3 glasses a week    Drug use: No   Sexual activity: Yes    Birth control/protection: None, Surgical    Comment: 1st intercourse- 17, partners-2  Other Topics Concern   Not on file  Social History Narrative   Not on file   Social Determinants of Health   Financial Resource Strain: Low Risk  (08/28/2022)   Overall Financial Resource Strain (CARDIA)    Difficulty of Paying Living Expenses: Not hard at all  Food Insecurity: No Food Insecurity (08/28/2022)   Hunger Vital Sign    Worried About Running Out of Food in the Last Year: Never true    Ran Out of Food in the Last Year: Never true  Transportation Needs: No Transportation Needs (08/28/2022)   PRAPARE - Administrator, Civil Service (Medical): No    Lack of Transportation (Non-Medical): No  Physical Activity: Sufficiently Active (08/28/2022)   Exercise Vital Sign    Days of Exercise per Week: 3 days    Minutes of Exercise per Session: 50 min  Stress: No Stress Concern Present (08/28/2022)   [employer] of Occupational Health - Occupational Stress Questionnaire    Feeling of Stress : Not at all  Social Connections: Socially Integrated (08/28/2022)   Social Connection and Isolation Panel [NHANES]    Frequency of Communication with Friends and Family: Three times a week    Frequency of Social Gatherings with Friends and Family: Three times a week    Attends Religious Services: More than 4 times per year    Active Member of Clubs or Organizations: Yes    Attends Engineer, structural: More than 4 times per year    Marital Status: Married    Objective:  BP 130/70   Pulse 92   Temp (!) 97.2 F (36.2 C)   Ht 5\' 1"  (1.549 m)   Wt 162 lb (73.5 kg)   SpO2 98%   BMI 30.61 kg/m      11/29/2022    3:29 PM 08/28/2022    2:36 PM 06/22/2022    8:01 AM  BP/Weight   Systolic BP 130 120 124  Diastolic BP 70 70 72  Wt. (Lbs) 162 169.4 163  BMI 30.61 kg/m2 32.01 kg/m2 30.8 kg/m2    Physical Exam Vitals reviewed.  Constitutional:      Appearance: Normal appearance.  She is normal weight.  Neck:     Vascular: No carotid bruit.  Cardiovascular:     Rate and Rhythm: Normal rate and regular rhythm.     Heart sounds: Normal heart sounds.  Pulmonary:     Effort: Pulmonary effort is normal. No respiratory distress.     Breath sounds: Normal breath sounds.  Abdominal:     General: Abdomen is flat. Bowel sounds are normal.     Palpations: Abdomen is soft.     Tenderness: There is no abdominal tenderness.  Musculoskeletal:        General: Tenderness (lumbar) present.  Neurological:     Mental Status: She is alert and oriented to person, place, and time.  Psychiatric:        Mood and Affect: Mood normal.        Behavior: Behavior normal.     Diabetic Foot Exam - Simple   No data filed      Lab Results  Component Value Date   WBC 6.2 08/28/2022   HGB 12.8 08/28/2022   HCT 38.5 08/28/2022   PLT 328 08/28/2022   GLUCOSE 94 08/28/2022   CHOL 141 09/19/2021   TRIG 62 09/19/2021   HDL 79 09/19/2021   LDLCALC 49 09/19/2021   ALT 16 08/28/2022   AST 24 08/28/2022   NA 142 08/28/2022   K 4.8 08/28/2022   CL 102 08/28/2022   CREATININE 0.65 08/28/2022   BUN 10 08/28/2022   CO2 26 08/28/2022   TSH 2.790 08/28/2022   INR 0.9 06/22/2020      Assessment & Plan:    Migraine without aura and without status migrainosus, not intractable Assessment & Plan: Start topamax titration.    Orders: -     Butalbital-APAP-Caffeine; TAKE 1 TO 2 TABLETS BY MOUTH EVERY 6 HOURS AS NEEDED FOR HEADACHE  Dispense: 30 tablet; Refill: 2 -     Promethazine HCl; TAKE 1 TABLET BY MOUTH EVERY 6 HOURS AS NEEDED FOR NAUSEA  Dispense: 60 tablet; Refill: 2 -     SUMAtriptan Succinate; One at onset of migraine and may repeat in 2 hours x 1, if headache persists or  recurs.  Dispense: 10 tablet; Refill: 1 -     Topiramate; Take 1 tablet (25 mg total) by mouth at bedtime for 7 days, THEN 2 tablets (50 mg total) at bedtime for 7 days, THEN 2 tablets (50 mg total) 2 (two) times daily for 18 days.  Dispense: 93 tablet; Refill: 0  Mild recurrent major depression (HCC) Assessment & Plan: Well controlled.  The current medical regimen is effective;  continue present plan and medications.   Orders: -     buPROPion HCl ER (SR); Take 1 tablet (150 mg total) by mouth at bedtime as needed.  Dispense: 30 tablet; Refill: 5 -     clonazePAM; Take 1 tablet (0.5 mg total) by mouth 2 (two) times daily as needed. for anxiety  Dispense: 60 tablet; Refill: 3  Acute right-sided low back pain with right-sided sciatica -     Cyclobenzaprine HCl; Take 1 tablet (5 mg total) by mouth 3 (three) times daily as needed. for muscle spams  Dispense: 90 tablet; Refill: 11  Right foot pain -     Ibuprofen; Take 1 tablet (800 mg total) by mouth every 8 (eight) hours as needed.  Dispense: 90 tablet; Refill: 2  Attention deficit disorder (ADD) without hyperactivity Assessment & Plan: Ritalin refilled.  Well-controlled.  Orders: -  Methylphenidate HCl; Take 2 tablets (20 mg total) by mouth 3 (three) times daily with meals.  Dispense: 180 tablet; Refill: 0  Vitamin D deficiency Assessment & Plan: Continue vitamin D to once weekly  Orders: -     Vitamin D (Ergocalciferol); Take 1 capsule (50,000 Units total) by mouth 3 (three) times a week.  Dispense: 12 capsule; Refill: 3  Fibromyalgia Assessment & Plan: The current medical regimen is effective;  continue present plan and medications.   Orders: -     DULoxetine HCl; Take 1 capsule (60 mg total) by mouth 2 (two) times daily.  Dispense: 60 capsule; Refill: 5 -     Amitriptyline HCl; Take 1 tablet (100 mg total) by mouth at bedtime.  Dispense: 30 tablet; Refill: 5  Other orders -     Meclizine HCl; Take 1 tablet (25 mg total)  by mouth every 6 (six) hours as needed for dizziness.  Dispense: 30 tablet; Refill: 0     Meds ordered this encounter  Medications   DULoxetine (CYMBALTA) 60 MG capsule    Sig: Take 1 capsule (60 mg total) by mouth 2 (two) times daily.    Dispense:  60 capsule    Refill:  5   butalbital-acetaminophen-caffeine (FIORICET) 50-325-40 MG tablet    Sig: TAKE 1 TO 2 TABLETS BY MOUTH EVERY 6 HOURS AS NEEDED FOR HEADACHE    Dispense:  30 tablet    Refill:  2   amitriptyline (ELAVIL) 100 MG tablet    Sig: Take 1 tablet (100 mg total) by mouth at bedtime.    Dispense:  30 tablet    Refill:  5   buPROPion (WELLBUTRIN SR) 150 MG 12 hr tablet    Sig: Take 1 tablet (150 mg total) by mouth at bedtime as needed.    Dispense:  30 tablet    Refill:  5   clonazePAM (KLONOPIN) 0.5 MG tablet    Sig: Take 1 tablet (0.5 mg total) by mouth 2 (two) times daily as needed. for anxiety    Dispense:  60 tablet    Refill:  3   cyclobenzaprine (FLEXERIL) 5 MG tablet    Sig: Take 1 tablet (5 mg total) by mouth 3 (three) times daily as needed. for muscle spams    Dispense:  90 tablet    Refill:  11   ibuprofen (ADVIL) 800 MG tablet    Sig: Take 1 tablet (800 mg total) by mouth every 8 (eight) hours as needed.    Dispense:  90 tablet    Refill:  2   meclizine (ANTIVERT) 25 MG tablet    Sig: Take 1 tablet (25 mg total) by mouth every 6 (six) hours as needed for dizziness.    Dispense:  30 tablet    Refill:  0   methylphenidate (RITALIN) 10 MG tablet    Sig: Take 2 tablets (20 mg total) by mouth 3 (three) times daily with meals.    Dispense:  180 tablet    Refill:  0   promethazine (PHENERGAN) 12.5 MG tablet    Sig: TAKE 1 TABLET BY MOUTH EVERY 6 HOURS AS NEEDED FOR NAUSEA    Dispense:  60 tablet    Refill:  2   Vitamin D, Ergocalciferol, (DRISDOL) 1.25 MG (50000 UNIT) CAPS capsule    Sig: Take 1 capsule (50,000 Units total) by mouth 3 (three) times a week.    Dispense:  12 capsule    Refill:  3  SUMAtriptan (IMITREX) 50 MG tablet    Sig: One at onset of migraine and may repeat in 2 hours x 1, if headache persists or recurs.    Dispense:  10 tablet    Refill:  1   topiramate (TOPAMAX) 25 MG tablet    Sig: Take 1 tablet (25 mg total) by mouth at bedtime for 7 days, THEN 2 tablets (50 mg total) at bedtime for 7 days, THEN 2 tablets (50 mg total) 2 (two) times daily for 18 days.    Dispense:  93 tablet    Refill:  0    No orders of the defined types were placed in this encounter.    Follow-up: Return in about 4 weeks (around 12/27/2022) for chronic follow up migraines.   I,Katherina A Bramblett,acting as a scribe for Blane Ohara, MD.,have documented all relevant documentation on the behalf of Blane Ohara, MD,as directed by  Blane Ohara, MD while in the presence of Blane Ohara, MD.   An After Visit Summary was printed and given to the patient.  Blane Ohara, MD [facility] [phone]

## 2022-11-30 ENCOUNTER — Ambulatory Visit: Payer: 59 | Admitting: Family Medicine

## 2022-12-03 NOTE — Assessment & Plan Note (Signed)
Continue vitamin D to once weekly

## 2022-12-03 NOTE — Assessment & Plan Note (Signed)
The current medical regimen is effective;  continue present plan and medications.  

## 2022-12-03 NOTE — Assessment & Plan Note (Signed)
Start topamax titration.

## 2022-12-03 NOTE — Assessment & Plan Note (Signed)
Well controlled.  The current medical regimen is effective;  continue present plan and medications. 

## 2022-12-03 NOTE — Assessment & Plan Note (Signed)
Ritalin refilled.  Well-controlled.

## 2022-12-04 ENCOUNTER — Other Ambulatory Visit: Payer: Self-pay

## 2022-12-04 DIAGNOSIS — F988 Other specified behavioral and emotional disorders with onset usually occurring in childhood and adolescence: Secondary | ICD-10-CM

## 2022-12-04 MED ORDER — METHYLPHENIDATE HCL 10 MG PO TABS: 20.0000 mg | ORAL_TABLET | Freq: Three times a day (TID) | ORAL | 0 refills | Status: DC

## 2022-12-04 NOTE — Telephone Encounter (Signed)
Copied from CRM [id number]. Topic: Clinical - Medication Refill >> Dec 04, 2022 10:18 AM Lorin Glass B wrote: Most Recent Primary Care Visit:  Provider: [facility]  Department: [facility]  Visit Type: OFFICE VISIT  Date: 11/29/2022  Medication: Ritalin 10mg   Has the patient contacted their pharmacy? Yes (Agent: If no, request that the patient contact the pharmacy for the refill. If patient does not wish to contact the pharmacy document the reason why and proceed with request.) (Agent: If yes, when and what did the pharmacy advise?)  Is this the correct pharmacy for this prescription? Yes If no, delete pharmacy and type the correct one.  This is the patient's preferred pharmacy:   [id number] - [place], [place] - [address] AT CORNER OF [facility] [address] [place] [address] Phone: [phone] Fax: [phone]   Has the prescription been filled recently? No  Is the patient out of the medication? Yes  Has the patient been seen for an appointment in the last year OR does the patient have an upcoming appointment? Yes  Can we respond through MyChart? Yes  Agent: Please be advised that Rx refills may take up to 3 business days. We ask that you follow-up with your pharmacy.

## 2022-12-29 ENCOUNTER — Ambulatory Visit (INDEPENDENT_AMBULATORY_CARE_PROVIDER_SITE_OTHER): Payer: 59 | Admitting: Family Medicine

## 2022-12-29 VITALS — BP 110/60 | HR 91 | Temp 97.9°F | Resp 14 | Ht 61.0 in | Wt 158.0 lb

## 2022-12-29 DIAGNOSIS — G43009 Migraine without aura, not intractable, without status migrainosus: Secondary | ICD-10-CM | POA: Diagnosis not present

## 2022-12-29 DIAGNOSIS — M722 Plantar fascial fibromatosis: Secondary | ICD-10-CM | POA: Diagnosis not present

## 2022-12-29 DIAGNOSIS — F330 Major depressive disorder, recurrent, mild: Secondary | ICD-10-CM

## 2022-12-29 NOTE — Progress Notes (Addendum)
Subjective:  Patient ID: Anne Brown, female    DOB: 28-Feb-1970  Age: 52 y.o. MRN: [id number]  Chief Complaint  Patient presents with   Migraine    HPI The patient, with a history of plantar fasciitis and migraines, presents for follow-up. She reports worsening heel pain, describing it as a popping sensation that is exacerbated by pressure and massage. The pain is severe enough that she has resorted to wearing heels at home to shift pressure to the front of the foot. She has been managing the pain with ibuprofen every six hours.  Regarding her migraines, the patient reports improvement since starting Topamax. She still experiences migraines, but the frequency has decreased significantly. She has been taking two pills of Topamax daily, instead of the prescribed three, due to concerns about side effects. She plans to increase the dosage to three pills at night.  The patient also reports feeling more depressed and anxious, which she attributes to her physical limitations and inability to do the things she wants to do. She expresses frustration with her current situation and a desire to be more active.     11/29/2022    3:39 PM 08/28/2022    2:47 PM 06/22/2022    8:09 AM 03/02/2022    9:00 AM 09/30/2020    7:48 AM  Depression screen PHQ 2/9  Decreased Interest 0 0 0 0 1  Down, Depressed, Hopeless 0 1 1 0 1  PHQ - 2 Score 0 1 1 0 2  Altered sleeping 0 0 0  0  Tired, decreased energy 1 2 3  3   Change in appetite 0 2 1  1   Feeling bad or failure about yourself  0 2 1  0  Trouble concentrating 2 3 3  2   Moving slowly or fidgety/restless 1 1 0  2  Suicidal thoughts 0 0 0  0  PHQ-9 Score 4 11 9  10   Difficult doing work/chores Not difficult at all Very difficult Somewhat difficult  Somewhat difficult        11/29/2022    3:39 PM  Fall Risk   Falls in the past year? 0  Number falls in past yr: 0  Injury with Fall? 0  Risk for fall due to : No Fall Risks  Follow up Falls evaluation  completed      12/29/2022   11:38 AM 11/29/2022    3:40 PM 08/28/2022    2:48 PM 06/22/2022    8:09 AM  GAD 7 : Generalized Anxiety Score  Nervous, Anxious, on Edge 2 2 1 1   Control/stop worrying 1 1 0 1  Worry too much - different things 1 0 1 2  Trouble relaxing 2 2 2 3   Restless 2 1  1   Easily annoyed or irritable 1 0 1 0  Afraid - awful might happen 1 0 0 0  Total GAD 7 Score 10 6  8   Anxiety Difficulty Not difficult at all Not difficult at all Somewhat difficult Somewhat difficult     Patient Care Team: Blane Ohara, MD as PCP - General (Family Medicine) Osborn Coho, MD as Consulting Physician (Obstetrics and Gynecology)   Review of Systems  Constitutional:  Negative for chills, fatigue and fever.  HENT:  Negative for congestion, ear pain and sore throat.   Respiratory:  Negative for cough and shortness of breath.   Cardiovascular:  Negative for chest pain and palpitations.  Gastrointestinal:  Positive for nausea. Negative for abdominal pain, constipation, diarrhea and  vomiting.  Endocrine: Negative for polydipsia, polyphagia and polyuria.  Genitourinary:  Negative for difficulty urinating and dysuria.  Musculoskeletal:  Negative for arthralgias, back pain and myalgias.  Skin:  Negative for rash.  Neurological:  Positive for dizziness and headaches.  Psychiatric/Behavioral:  Negative for dysphoric mood. The patient is not nervous/anxious.     Current Outpatient Medications on File Prior to Visit  Medication Sig Dispense Refill   amitriptyline (ELAVIL) 100 MG tablet Take 1 tablet (100 mg total) by mouth at bedtime. 30 tablet 5   buPROPion (WELLBUTRIN SR) 150 MG 12 hr tablet Take 1 tablet (150 mg total) by mouth at bedtime as needed. 30 tablet 5   butalbital-acetaminophen-caffeine (FIORICET) 50-325-40 MG tablet TAKE 1 TO 2 TABLETS BY MOUTH EVERY 6 HOURS AS NEEDED FOR HEADACHE 30 tablet 2   cetirizine (ZYRTEC) 10 MG tablet Take 10 mg by mouth at bedtime.     clonazePAM  (KLONOPIN) 0.5 MG tablet Take 1 tablet (0.5 mg total) by mouth 2 (two) times daily as needed. for anxiety 60 tablet 3   cyclobenzaprine (FLEXERIL) 5 MG tablet Take 1 tablet (5 mg total) by mouth 3 (three) times daily as needed. for muscle spams 90 tablet 11   DULoxetine (CYMBALTA) 60 MG capsule Take 1 capsule (60 mg total) by mouth 2 (two) times daily. 60 capsule 5   ibuprofen (ADVIL) 800 MG tablet Take 1 tablet (800 mg total) by mouth every 8 (eight) hours as needed. 90 tablet 2   meclizine (ANTIVERT) 25 MG tablet Take 1 tablet (25 mg total) by mouth every 6 (six) hours as needed for dizziness. 30 tablet 0   methylphenidate (RITALIN) 10 MG tablet Take 2 tablets (20 mg total) by mouth 3 (three) times daily with meals. 180 tablet 0   OnabotulinumtoxinA, Cosmetic, (BOTOX COSMETIC IM)      promethazine (PHENERGAN) 12.5 MG tablet TAKE 1 TABLET BY MOUTH EVERY 6 HOURS AS NEEDED FOR NAUSEA 60 tablet 2   SUMAtriptan (IMITREX) 50 MG tablet One at onset of migraine and may repeat in 2 hours x 1, if headache persists or recurs. 10 tablet 1   topiramate (TOPAMAX) 25 MG tablet Take 1 tablet (25 mg total) by mouth at bedtime for 7 days, THEN 2 tablets (50 mg total) at bedtime for 7 days, THEN 2 tablets (50 mg total) 2 (two) times daily for 18 days. 93 tablet 0   Vitamin D, Ergocalciferol, (DRISDOL) 1.25 MG (50000 UNIT) CAPS capsule Take 1 capsule (50,000 Units total) by mouth 3 (three) times a week. 12 capsule 3   No current facility-administered medications on file prior to visit.   Past Medical History:  Diagnosis Date   Abnormal Pap smear    ADHD    Anxiety    Family history of adverse reaction to anesthesia    mother n/v severe   Headache    migraines   Lower back pain    LSIL (low grade squamous intraepithelial lesion) on Pap smear 10/15/2007   Past Surgical History:  Procedure Laterality Date   ANTERIOR AND POSTERIOR REPAIR N/A 06/27/2018   Procedure: ANTERIOR (CYSTOCELE) AND POSTERIOR REPAIR  (RECTOCELE);  Surgeon: Osborn Coho, MD;  Location: [facility];  Service: Gynecology;  Laterality: N/A;   DILATION AND CURETTAGE OF UTERUS  1992   DILATION AND CURETTAGE OF UTERUS  2009   ECTOPIC PREGNANCY SURGERY  01/2008   After BTL   HYSTEROSCOPY WITH NOVASURE N/A 06/27/2018   Procedure: HYSTEROSCOPY WITH NOVASURE;  Surgeon: Osborn Coho, MD;  Location: [facility];  Service: Gynecology;  Laterality: N/A;   TUBAL LIGATION      Family History  Problem Relation Age of Onset   Depression Mother    Social History   Socioeconomic History   Marital status: Married    Spouse name: Not on file   Number of children: Not on file   Years of education: Not on file   Highest education level: GED or equivalent  Occupational History   Not on file  Tobacco Use   Smoking status: Never   Smokeless tobacco: Never  Vaping Use   Vaping status: Never Used  Substance and Sexual Activity   Alcohol use: Yes    Alcohol/week: 0.0 standard drinks of alcohol    Comment: 3 glasses a week    Drug use: No   Sexual activity: Yes    Birth control/protection: None, Surgical    Comment: 1st intercourse- 17, partners-2  Other Topics Concern   Not on file  Social History Narrative   Not on file   Social Determinants of Health   Financial Resource Strain: Low Risk  (08/28/2022)   Overall Financial Resource Strain (CARDIA)    Difficulty of Paying Living Expenses: Not hard at all  Food Insecurity: No Food Insecurity (08/28/2022)   Hunger Vital Sign    Worried About Running Out of Food in the Last Year: Never true    Ran Out of Food in the Last Year: Never true  Transportation Needs: No Transportation Needs (08/28/2022)   PRAPARE - Administrator, Civil Service (Medical): No    Lack of Transportation (Non-Medical): No  Physical Activity: Sufficiently Active (08/28/2022)   Exercise Vital Sign    Days of Exercise per Week: 3 days    Minutes of Exercise per  Session: 50 min  Stress: No Stress Concern Present (08/28/2022)   [employer] of Occupational Health - Occupational Stress Questionnaire    Feeling of Stress : Not at all  Social Connections: Socially Integrated (08/28/2022)   Social Connection and Isolation Panel [NHANES]    Frequency of Communication with Friends and Family: Three times a week    Frequency of Social Gatherings with Friends and Family: Three times a week    Attends Religious Services: More than 4 times per year    Active Member of Clubs or Organizations: Yes    Attends Engineer, structural: More than 4 times per year    Marital Status: Married    Objective:  BP 110/60   Pulse 91   Temp 97.9 F (36.6 C)   Resp 14   Ht 5\' 1"  (1.549 m)   Wt 158 lb (71.7 kg)   SpO2 98%   BMI 29.85 kg/m      12/29/2022   11:25 AM 11/29/2022    3:29 PM 08/28/2022    2:36 PM  BP/Weight  Systolic BP 110 130 120  Diastolic BP 60 70 70  Wt. (Lbs) 158 162 169.4  BMI 29.85 kg/m2 30.61 kg/m2 32.01 kg/m2    Physical Exam Vitals reviewed.  Constitutional:      Appearance: Normal appearance.  Cardiovascular:     Rate and Rhythm: Normal rate and regular rhythm.     Heart sounds: Normal heart sounds.  Pulmonary:     Effort: Pulmonary effort is normal. No respiratory distress.     Breath sounds: Normal breath sounds.  Abdominal:     General: Abdomen is flat. Bowel  sounds are normal.     Palpations: Abdomen is soft.     Tenderness: There is no abdominal tenderness.  Musculoskeletal:        General: Tenderness (left plantar fascia. nontender over rest of foot. Nontender over achilles.) present.  Neurological:     Mental Status: She is alert and oriented to person, place, and time.  Psychiatric:        Mood and Affect: Mood normal.        Behavior: Behavior normal.   Risks were discussed including bleeding, infection, increase in sugars if diabetic, atrophy at site of injection, and increased pain.  After consent was  obtained, using sterile technique the left foot was prepped with alcohol. The plantar fascia of the left foot was injected form medial side with Kenalog 20 mg and 3 ml plain Lidocaine was then injected and the needle withdrawn.  The procedure was well tolerated.   The patient is asked to continue to rest the joint for a few more days before resuming regular activities.  It may be more painful for the first 1-2 days.  Watch for fever, or increased swelling or persistent pain in the joint. Call or return to clinic prn if such symptoms occur or there is failure to improve as anticipated.    Diabetic Foot Exam - Simple   No data filed      Lab Results  Component Value Date   WBC 6.2 08/28/2022   HGB 12.8 08/28/2022   HCT 38.5 08/28/2022   PLT 328 08/28/2022   GLUCOSE 94 08/28/2022   CHOL 141 09/19/2021   TRIG 62 09/19/2021   HDL 79 09/19/2021   LDLCALC 49 09/19/2021   ALT 16 08/28/2022   AST 24 08/28/2022   NA 142 08/28/2022   K 4.8 08/28/2022   CL 102 08/28/2022   CREATININE 0.65 08/28/2022   BUN 10 08/28/2022   CO2 26 08/28/2022   TSH 2.790 08/28/2022   INR 0.9 06/22/2020      Assessment & Plan:    Migraine without aura and without status migrainosus, not intractable Assessment & Plan: Improvement noted with Topamax, frequency reduced but still experiencing 2 episodes per week. Currently on 2 pills nightly, plan to increase to 3 pills nightly. -Continue Topamax, increase to 3 pills nightly. -Continue monitoring frequency and severity of migraines.   Mild recurrent major depression (HCC) Assessment & Plan: Well controlled.  The current medical regimen is effective;  continue present plan and medications.    Plantar fasciitis of left foot Assessment & Plan: Non-pharmacological interventions (massage, ball rolling) have not provided relief. Pain is severe enough to alter footwear habits. -Plan for injection therapy in the office today. Completed without  complications.      No orders of the defined types were placed in this encounter.   No orders of the defined types were placed in this encounter.   Follow-up: Return in about 3 months (around 03/29/2023).   I,Marla I Leal-Borjas,acting as a scribe for Blane Ohara, MD.,have documented all relevant documentation on the behalf of Blane Ohara, MD,as directed by  Blane Ohara, MD while in the presence of Blane Ohara, MD.   An After Visit Summary was printed and given to the patient.  I attest that I have reviewed this visit and agree with the plan scribed by my staff.   Blane Ohara, MD [facility] [phone]

## 2022-12-31 ENCOUNTER — Encounter: Payer: Self-pay | Admitting: Family Medicine

## 2022-12-31 DIAGNOSIS — M722 Plantar fascial fibromatosis: Secondary | ICD-10-CM | POA: Insufficient documentation

## 2022-12-31 NOTE — Assessment & Plan Note (Signed)
Non-pharmacological interventions (massage, ball rolling) have not provided relief. Pain is severe enough to alter footwear habits. -Plan for injection therapy in the office today. Completed without complications.

## 2022-12-31 NOTE — Assessment & Plan Note (Signed)
Well controlled.  The current medical regimen is effective;  continue present plan and medications. 

## 2022-12-31 NOTE — Assessment & Plan Note (Signed)
Improvement noted with Topamax, frequency reduced but still experiencing 2 episodes per week. Currently on 2 pills nightly, plan to increase to 3 pills nightly. -Continue Topamax, increase to 3 pills nightly. -Continue monitoring frequency and severity of migraines.

## 2023-01-10 ENCOUNTER — Ambulatory Visit (INDEPENDENT_AMBULATORY_CARE_PROVIDER_SITE_OTHER): Payer: 59 | Admitting: Family Medicine

## 2023-01-10 ENCOUNTER — Other Ambulatory Visit: Payer: Self-pay | Admitting: Family Medicine

## 2023-01-10 ENCOUNTER — Encounter: Payer: Self-pay | Admitting: Family Medicine

## 2023-01-10 VITALS — BP 112/70 | HR 88 | Temp 97.6°F | Ht 61.0 in | Wt 155.0 lb

## 2023-01-10 DIAGNOSIS — J208 Acute bronchitis due to other specified organisms: Secondary | ICD-10-CM | POA: Diagnosis not present

## 2023-01-10 DIAGNOSIS — F988 Other specified behavioral and emotional disorders with onset usually occurring in childhood and adolescence: Secondary | ICD-10-CM

## 2023-01-10 DIAGNOSIS — G43009 Migraine without aura, not intractable, without status migrainosus: Secondary | ICD-10-CM

## 2023-01-10 MED ORDER — AZITHROMYCIN 250 MG PO TABS: ORAL_TABLET | ORAL | 0 refills | Status: AC

## 2023-01-10 MED ORDER — METHYLPHENIDATE HCL 10 MG PO TABS: 20.0000 mg | ORAL_TABLET | Freq: Three times a day (TID) | ORAL | 0 refills | Status: DC

## 2023-01-10 NOTE — Assessment & Plan Note (Signed)
Refill sent.  Well controlled.

## 2023-01-10 NOTE — Patient Instructions (Signed)
Take mucinex  Push fluids

## 2023-01-10 NOTE — Assessment & Plan Note (Signed)
Zpack and mucinex.

## 2023-01-10 NOTE — Progress Notes (Signed)
Acute Office Visit  Subjective:    Patient ID: Anne Brown, female    DOB: [birth year], 52 y.o.   MRN: [id number]  Chief Complaint  Patient presents with   URI    Discussed the use of AI scribe software for clinical note transcription with the patient, who gave verbal consent to proceed.    HPI: Patient is in today for URI started yesterday, c/o dry cough but sounds productive, headache, facial pressure, runny nose and PND. Has taken robitussin which did not help much.  Past Medical History:  Diagnosis Date   Abnormal Pap smear    ADHD    Anxiety    Family history of adverse reaction to anesthesia    mother n/v severe   Headache    migraines   Lower back pain    LSIL (low grade squamous intraepithelial lesion) on Pap smear 10/15/2007    Past Surgical History:  Procedure Laterality Date   ANTERIOR AND POSTERIOR REPAIR N/A 06/27/2018   Procedure: ANTERIOR (CYSTOCELE) AND POSTERIOR REPAIR (RECTOCELE);  Surgeon: Osborn Coho, MD;  Location: [facility];  Service: Gynecology;  Laterality: N/A;   DILATION AND CURETTAGE OF UTERUS  1992   DILATION AND CURETTAGE OF UTERUS  2009   ECTOPIC PREGNANCY SURGERY  01/2008   After BTL   HYSTEROSCOPY WITH NOVASURE N/A 06/27/2018   Procedure: HYSTEROSCOPY WITH NOVASURE;  Surgeon: Osborn Coho, MD;  Location: [facility];  Service: Gynecology;  Laterality: N/A;   TUBAL LIGATION      Family History  Problem Relation Age of Onset   Depression Mother     Social History   Socioeconomic History   Marital status: Married    Spouse name: Not on file   Number of children: Not on file   Years of education: Not on file   Highest education level: GED or equivalent  Occupational History   Not on file  Tobacco Use   Smoking status: Never   Smokeless tobacco: Never  Vaping Use   Vaping status: Never Used  Substance and Sexual Activity   Alcohol use: Yes    Alcohol/week: 0.0 standard drinks of alcohol     Comment: 3 glasses a week    Drug use: No   Sexual activity: Yes    Birth control/protection: None, Surgical    Comment: 1st intercourse- 17, partners-2  Other Topics Concern   Not on file  Social History Narrative   Not on file   Social Drivers of Health   Financial Resource Strain: Low Risk  (08/28/2022)   Overall Financial Resource Strain (CARDIA)    Difficulty of Paying Living Expenses: Not hard at all  Food Insecurity: No Food Insecurity (08/28/2022)   Hunger Vital Sign    Worried About Running Out of Food in the Last Year: Never true    Ran Out of Food in the Last Year: Never true  Transportation Needs: No Transportation Needs (08/28/2022)   PRAPARE - Administrator, Civil Service (Medical): No    Lack of Transportation (Non-Medical): No  Physical Activity: Sufficiently Active (08/28/2022)   Exercise Vital Sign    Days of Exercise per Week: 3 days    Minutes of Exercise per Session: 50 min  Stress: No Stress Concern Present (08/28/2022)   [employer] of Occupational Health - Occupational Stress Questionnaire    Feeling of Stress : Not at all  Social Connections: Socially Integrated (08/28/2022)   Social Connection and Isolation Panel [NHANES]  Frequency of Communication with Friends and Family: Three times a week    Frequency of Social Gatherings with Friends and Family: Three times a week    Attends Religious Services: More than 4 times per year    Active Member of Clubs or Organizations: Yes    Attends Banker Meetings: More than 4 times per year    Marital Status: Married  Catering manager Violence: Not At Risk (03/02/2022)   Humiliation, Afraid, Rape, and Kick questionnaire    Fear of Current or Ex-Partner: No    Emotionally Abused: No    Physically Abused: No    Sexually Abused: No    Outpatient Medications Prior to Visit  Medication Sig Dispense Refill   amitriptyline (ELAVIL) 100 MG tablet Take 1 tablet (100 mg total) by mouth at bedtime.  30 tablet 5   buPROPion (WELLBUTRIN SR) 150 MG 12 hr tablet Take 1 tablet (150 mg total) by mouth at bedtime as needed. 30 tablet 5   butalbital-acetaminophen-caffeine (FIORICET) 50-325-40 MG tablet TAKE 1 TO 2 TABLETS BY MOUTH EVERY 6 HOURS AS NEEDED FOR HEADACHE 30 tablet 2   cetirizine (ZYRTEC) 10 MG tablet Take 10 mg by mouth at bedtime.     clonazePAM (KLONOPIN) 0.5 MG tablet Take 1 tablet (0.5 mg total) by mouth 2 (two) times daily as needed. for anxiety 60 tablet 3   cyclobenzaprine (FLEXERIL) 5 MG tablet Take 1 tablet (5 mg total) by mouth 3 (three) times daily as needed. for muscle spams 90 tablet 11   DULoxetine (CYMBALTA) 60 MG capsule Take 1 capsule (60 mg total) by mouth 2 (two) times daily. 60 capsule 5   ibuprofen (ADVIL) 800 MG tablet Take 1 tablet (800 mg total) by mouth every 8 (eight) hours as needed. 90 tablet 2   meclizine (ANTIVERT) 25 MG tablet Take 1 tablet (25 mg total) by mouth every 6 (six) hours as needed for dizziness. 30 tablet 0   OnabotulinumtoxinA, Cosmetic, (BOTOX COSMETIC IM)      promethazine (PHENERGAN) 12.5 MG tablet TAKE 1 TABLET BY MOUTH EVERY 6 HOURS AS NEEDED FOR NAUSEA 60 tablet 2   SUMAtriptan (IMITREX) 50 MG tablet One at onset of migraine and may repeat in 2 hours x 1, if headache persists or recurs. 10 tablet 1   topiramate (TOPAMAX) 25 MG tablet Take 1 tablet (25 mg total) by mouth at bedtime for 7 days, THEN 2 tablets (50 mg total) at bedtime for 7 days, THEN 2 tablets (50 mg total) 2 (two) times daily for 18 days. 93 tablet 0   Vitamin D, Ergocalciferol, (DRISDOL) 1.25 MG (50000 UNIT) CAPS capsule Take 1 capsule (50,000 Units total) by mouth 3 (three) times a week. 12 capsule 3   methylphenidate (RITALIN) 10 MG tablet Take 2 tablets (20 mg total) by mouth 3 (three) times daily with meals. 180 tablet 0   No facility-administered medications prior to visit.    Allergies  Allergen Reactions   Oxybutynin Other (See Comments)    Severe dry mouth and  mouth ulcers    Review of Systems  Constitutional:  Negative for chills, fatigue and fever.  HENT:  Positive for congestion, postnasal drip, rhinorrhea, sinus pressure and sore throat. Negative for ear pain and sinus pain.   Respiratory:  Positive for cough. Negative for shortness of breath.   Cardiovascular:  Negative for chest pain.  Gastrointestinal:  Negative for diarrhea and nausea.  Neurological:  Positive for headaches. Negative for dizziness.  Objective:        01/10/2023    9:34 AM 12/29/2022   11:25 AM 11/29/2022    3:29 PM  Vitals with BMI  Height 5\' 1"  5\' 1"  5\' 1"   Weight 155 lbs 158 lbs 162 lbs  BMI 29.3 29.87 30.63  Systolic 112 110 811  Diastolic 70 60 70  Pulse 88 91 92    No data found.   Physical Exam Vitals reviewed.  Constitutional:      Appearance: Normal appearance. She is normal weight.  HENT:     Right Ear: Tympanic membrane, ear canal and external ear normal.     Left Ear: Tympanic membrane and external ear normal.     Nose: Congestion present.     Right Turbinates: Swollen and pale.     Left Turbinates: Swollen and pale.     Mouth/Throat:     Pharynx: Oropharynx is clear.  Cardiovascular:     Rate and Rhythm: Normal rate and regular rhythm.     Pulses: Normal pulses.     Heart sounds: Normal heart sounds. No murmur heard. Pulmonary:     Effort: Pulmonary effort is normal. No respiratory distress.     Breath sounds: Normal breath sounds.  Abdominal:     General: Abdomen is flat. Bowel sounds are normal.     Palpations: Abdomen is soft.     Tenderness: There is no abdominal tenderness.  Lymphadenopathy:     Cervical: No cervical adenopathy.  Neurological:     Mental Status: She is alert and oriented to person, place, and time.  Psychiatric:        Mood and Affect: Mood normal.        Behavior: Behavior normal.     Health Maintenance Due  Topic Date Due   DTaP/Tdap/Td (1 - Tdap) Never done   Colonoscopy  Never done    MAMMOGRAM  05/14/2021   Zoster Vaccines- Shingrix (1 of 2) Never done   COVID-19 Vaccine (5 - 2024-25 season) 09/24/2022    There are no preventive care reminders to display for this patient.   Lab Results  Component Value Date   TSH 2.790 08/28/2022   Lab Results  Component Value Date   WBC 6.2 08/28/2022   HGB 12.8 08/28/2022   HCT 38.5 08/28/2022   MCV 93 08/28/2022   PLT 328 08/28/2022   Lab Results  Component Value Date   NA 142 08/28/2022   K 4.8 08/28/2022   CO2 26 08/28/2022   GLUCOSE 94 08/28/2022   BUN 10 08/28/2022   CREATININE 0.65 08/28/2022   BILITOT 0.2 08/28/2022   ALKPHOS 105 08/28/2022   AST 24 08/28/2022   ALT 16 08/28/2022   PROT 6.5 08/28/2022   ALBUMIN 4.3 08/28/2022   CALCIUM 9.9 08/28/2022   ANIONGAP 7 06/24/2018   EGFR 107 08/28/2022   Lab Results  Component Value Date   CHOL 141 09/19/2021   Lab Results  Component Value Date   HDL 79 09/19/2021   Lab Results  Component Value Date   LDLCALC 49 09/19/2021   Lab Results  Component Value Date   TRIG 62 09/19/2021   Lab Results  Component Value Date   CHOLHDL 1.8 09/19/2021   No results found for: "HGBA1C"     Assessment & Plan:  Acute bronchitis due to other specified organisms Assessment & Plan: Zpack and mucinex.    Attention deficit disorder (ADD) without hyperactivity Assessment & Plan: Refill sent.  Well controlled.  Orders: -     Methylphenidate HCl; Take 2 tablets (20 mg total) by mouth 3 (three) times daily with meals.  Dispense: 180 tablet; Refill: 0  Other orders -     Azithromycin; Take 2 tablets on day 1, then 1 tablet daily on days 2 through 5  Dispense: 6 tablet; Refill: 0     Meds ordered this encounter  Medications   azithromycin (ZITHROMAX) 250 MG tablet    Sig: Take 2 tablets on day 1, then 1 tablet daily on days 2 through 5    Dispense:  6 tablet    Refill:  0   methylphenidate (RITALIN) 10 MG tablet    Sig: Take 2 tablets (20 mg total) by  mouth 3 (three) times daily with meals.    Dispense:  180 tablet    Refill:  0    No orders of the defined types were placed in this encounter.    Follow-up: No follow-ups on file.  An After Visit Summary was printed and given to the patient.  Blane Ohara, MD [facility] [phone]

## 2023-01-12 ENCOUNTER — Other Ambulatory Visit: Payer: Self-pay | Admitting: Family Medicine

## 2023-01-12 ENCOUNTER — Telehealth: Payer: Self-pay

## 2023-01-12 ENCOUNTER — Encounter: Payer: Self-pay | Admitting: Family Medicine

## 2023-01-12 MED ORDER — HYDROCODONE BIT-HOMATROP MBR 5-1.5 MG/5ML PO SOLN: 5.0000 mL | Freq: Four times a day (QID) | ORAL | 0 refills | Status: DC | PRN

## 2023-01-12 NOTE — Telephone Encounter (Signed)
Copied from CRM [id number]. Topic: Clinical - Medication Question >> Jan 12, 2023  9:23 AM Carlatta H wrote: Reason for CRM Please call [phone]//Patient would like to discuss the medication because she is not getting any better.

## 2023-01-12 NOTE — Telephone Encounter (Signed)
Called patient and she stated that she now have congestion, headache, and cough is worst.   Recommend patient to keep taking antibiotic, and pushing fluids. Patient stated she would like to see if she can get something for the cough, stated the tessalon pearls do not work.

## 2023-02-12 ENCOUNTER — Other Ambulatory Visit: Payer: Self-pay | Admitting: Family Medicine

## 2023-02-12 DIAGNOSIS — F988 Other specified behavioral and emotional disorders with onset usually occurring in childhood and adolescence: Secondary | ICD-10-CM

## 2023-02-12 DIAGNOSIS — G43009 Migraine without aura, not intractable, without status migrainosus: Secondary | ICD-10-CM

## 2023-02-13 MED ORDER — TOPIRAMATE 50 MG PO TABS: 50.0000 mg | ORAL_TABLET | Freq: Two times a day (BID) | ORAL | 0 refills | Status: DC

## 2023-02-13 MED ORDER — METHYLPHENIDATE HCL 10 MG PO TABS: 20.0000 mg | ORAL_TABLET | Freq: Three times a day (TID) | ORAL | 0 refills | Status: DC

## 2023-03-05 ENCOUNTER — Other Ambulatory Visit: Payer: Self-pay

## 2023-03-05 DIAGNOSIS — G43009 Migraine without aura, not intractable, without status migrainosus: Secondary | ICD-10-CM

## 2023-03-05 MED ORDER — TOPIRAMATE 50 MG PO TABS: 50.0000 mg | ORAL_TABLET | Freq: Two times a day (BID) | ORAL | 0 refills | Status: DC

## 2023-03-29 NOTE — Progress Notes (Unsigned)
 Subjective:  Patient ID: Anne Brown, female    DOB: September 11, 1970  Age: 53 y.o. MRN: [id number]  Chief Complaint  Patient presents with   Medical Management of Chronic Issues    Discussed the use of AI scribe software for clinical note transcription with the patient, who gave verbal consent to proceed.  History of Present Illness            03/30/2023    9:42 AM 11/29/2022    3:39 PM 08/28/2022    2:47 PM 06/22/2022    8:09 AM 03/02/2022    9:00 AM  Depression screen PHQ 2/9  Decreased Interest 0 0 0 0 0  Down, Depressed, Hopeless 1 0 1 1 0  PHQ - 2 Score 1 0 1 1 0  Altered sleeping 0 0 0 0   Tired, decreased energy 2 1 2 3    Change in appetite 0 0 2 1   Feeling bad or failure about yourself  0 0 2 1   Trouble concentrating 0 2 3 3    Moving slowly or fidgety/restless 0 1 1 0   Suicidal thoughts 0 0 0 0   PHQ-9 Score 3 4 11 9    Difficult doing work/chores Somewhat difficult Not difficult at all Very difficult Somewhat difficult         11/29/2022    3:39 PM  Fall Risk   Falls in the past year? 0  Number falls in past yr: 0  Injury with Fall? 0  Risk for fall due to : No Fall Risks  Follow up Falls evaluation completed    Patient Care Team: Blane Ohara, MD as PCP - General (Family Medicine) Osborn Coho, MD as Consulting Physician (Obstetrics and Gynecology)   Review of Systems  Constitutional:  Negative for chills, fatigue and fever.  HENT:  Negative for congestion, ear pain, rhinorrhea and sore throat.   Respiratory:  Negative for cough and shortness of breath.   Cardiovascular:  Negative for chest pain.  Gastrointestinal:  Negative for abdominal pain, constipation, diarrhea, nausea and vomiting.       Bloating and acid reflux  Genitourinary:  Negative for dysuria and urgency.  Musculoskeletal:  Negative for back pain and myalgias.  Neurological:  Negative for dizziness, weakness, light-headedness and headaches.  Psychiatric/Behavioral:  Negative for  dysphoric mood. The patient is not nervous/anxious.     Current Outpatient Medications on File Prior to Visit  Medication Sig Dispense Refill   cetirizine (ZYRTEC) 10 MG tablet Take 10 mg by mouth at bedtime.     HYDROcodone bit-homatropine (HYDROMET) 5-1.5 MG/5ML syrup Take 5 mLs by mouth every 6 (six) hours as needed for cough. 120 mL 0   ibuprofen (ADVIL) 800 MG tablet Take 1 tablet (800 mg total) by mouth every 8 (eight) hours as needed. 90 tablet 2   meclizine (ANTIVERT) 25 MG tablet Take 1 tablet (25 mg total) by mouth every 6 (six) hours as needed for dizziness. 30 tablet 0   OnabotulinumtoxinA, Cosmetic, (BOTOX COSMETIC IM)      SUMAtriptan (IMITREX) 50 MG tablet One at onset of migraine and may repeat in 2 hours x 1, if headache persists or recurs. 10 tablet 1   Vitamin D, Ergocalciferol, (DRISDOL) 1.25 MG (50000 UNIT) CAPS capsule Take 1 capsule (50,000 Units total) by mouth 3 (three) times a week. 12 capsule 3   No current facility-administered medications on file prior to visit.   Past Medical History:  Diagnosis Date   Abnormal  Pap smear    ADHD    Anxiety    Family history of adverse reaction to anesthesia    mother n/v severe   Headache    migraines   Lower back pain    LSIL (low grade squamous intraepithelial lesion) on Pap smear 10/15/2007   Past Surgical History:  Procedure Laterality Date   ANTERIOR AND POSTERIOR REPAIR N/A 06/27/2018   Procedure: ANTERIOR (CYSTOCELE) AND POSTERIOR REPAIR (RECTOCELE);  Surgeon: Osborn Coho, MD;  Location: [facility];  Service: Gynecology;  Laterality: N/A;   DILATION AND CURETTAGE OF UTERUS  1992   DILATION AND CURETTAGE OF UTERUS  2009   ECTOPIC PREGNANCY SURGERY  01/2008   After BTL   HYSTEROSCOPY WITH NOVASURE N/A 06/27/2018   Procedure: HYSTEROSCOPY WITH NOVASURE;  Surgeon: Osborn Coho, MD;  Location: [facility];  Service: Gynecology;  Laterality: N/A;   TUBAL LIGATION      Family History   Problem Relation Age of Onset   Depression Mother    Social History   Socioeconomic History   Marital status: Married    Spouse name: Not on file   Number of children: Not on file   Years of education: Not on file   Highest education level: GED or equivalent  Occupational History   Not on file  Tobacco Use   Smoking status: Never   Smokeless tobacco: Never  Vaping Use   Vaping status: Never Used  Substance and Sexual Activity   Alcohol use: Yes    Alcohol/week: 0.0 standard drinks of alcohol    Comment: 3 glasses a week    Drug use: No   Sexual activity: Yes    Birth control/protection: None, Surgical    Comment: 1st intercourse- 17, partners-2  Other Topics Concern   Not on file  Social History Narrative   Not on file   Social Drivers of Health   Financial Resource Strain: Low Risk  (08/28/2022)   Overall Financial Resource Strain (CARDIA)    Difficulty of Paying Living Expenses: Not hard at all  Food Insecurity: No Food Insecurity (08/28/2022)   Hunger Vital Sign    Worried About Running Out of Food in the Last Year: Never true    Ran Out of Food in the Last Year: Never true  Transportation Needs: No Transportation Needs (08/28/2022)   PRAPARE - Administrator, Civil Service (Medical): No    Lack of Transportation (Non-Medical): No  Physical Activity: Sufficiently Active (08/28/2022)   Exercise Vital Sign    Days of Exercise per Week: 3 days    Minutes of Exercise per Session: 50 min  Stress: No Stress Concern Present (08/28/2022)   [employer] of Occupational Health - Occupational Stress Questionnaire    Feeling of Stress : Not at all  Social Connections: Socially Integrated (08/28/2022)   Social Connection and Isolation Panel [NHANES]    Frequency of Communication with Friends and Family: Three times a week    Frequency of Social Gatherings with Friends and Family: Three times a week    Attends Religious Services: More than 4 times per year    Active  Member of Clubs or Organizations: Yes    Attends Banker Meetings: More than 4 times per year    Marital Status: Married    Objective:  BP 124/80   Pulse 99   Temp 98 F (36.7 C)   Ht 5\' 1"  (1.549 m)   Wt 145 lb (65.8  kg)   SpO2 96%   BMI 27.40 kg/m      03/30/2023    9:34 AM 01/10/2023    9:34 AM 12/29/2022   11:25 AM  BP/Weight  Systolic BP 124 112 110  Diastolic BP 80 70 60  Wt. (Lbs) 145 155 158  BMI 27.4 kg/m2 29.29 kg/m2 29.85 kg/m2    Physical Exam  Diabetic Foot Exam - Simple   No data filed      Lab Results  Component Value Date   WBC 6.2 08/28/2022   HGB 12.8 08/28/2022   HCT 38.5 08/28/2022   PLT 328 08/28/2022   GLUCOSE 94 08/28/2022   CHOL 141 09/19/2021   TRIG 62 09/19/2021   HDL 79 09/19/2021   LDLCALC 49 09/19/2021   ALT 16 08/28/2022   AST 24 08/28/2022   NA 142 08/28/2022   K 4.8 08/28/2022   CL 102 08/28/2022   CREATININE 0.65 08/28/2022   BUN 10 08/28/2022   CO2 26 08/28/2022   TSH 2.790 08/28/2022   INR 0.9 06/22/2020      Assessment & Plan:  Assessment and Plan       Primary insomnia Assessment & Plan: Continue amitriptyline.    Fibromyalgia Assessment & Plan: Achiness and tiredness attributed to fibromyalgia. Cold weather may exacerbate symptoms. Lyrica not preferred due to weight gain. - Continue amitriptyline, duloxetine, ibuprofen, and cyclobenzaprine.  Orders: -     Amitriptyline HCl; Take 1 tablet (100 mg total) by mouth at bedtime.  Dispense: 90 tablet; Refill: 1 -     DULoxetine HCl; Take 1 capsule (60 mg total) by mouth 2 (two) times daily.  Dispense: 180 capsule; Refill: 1  Urge incontinence Assessment & Plan: No medicine   Attention deficit disorder (ADD) without hyperactivity Assessment & Plan: Currently on methylphenidate due to shortage of 20 mg tablets. Discussed switching to extended-release formulation. - Switch to extended-release methylphenidate 20 mg, pending insurance approval.     Mild recurrent major depression (HCC) Assessment & Plan: On Wellbutrin for anxiety and depression, taken at night. - Continue Wellbutrin as currently prescribed.  Orders: -     buPROPion HCl ER (SR); Take 1 tablet (150 mg total) by mouth daily.  Dispense: 90 tablet; Refill: 1 -     clonazePAM; Take 1 tablet (0.5 mg total) by mouth 2 (two) times daily as needed. for anxiety  Dispense: 60 tablet; Refill: 3  Migraine without aura and without status migrainosus, not intractable Assessment & Plan: Topiramate dosage previously incorrect. Discussed side effects including sleepiness and cognitive effects. - Adjust topiramate dosage to 100 mg once at night. - Continue Imitrex and Phenergan as needed for migraines.  Orders: -     Butalbital-APAP-Caffeine; TAKE 1 TO 2 TABLETS BY MOUTH EVERY 6 HOURS AS NEEDED FOR HEADACHE  Dispense: 30 tablet; Refill: 2 -     Promethazine HCl; TAKE 1 TABLET BY MOUTH EVERY 6 HOURS AS NEEDED FOR NAUSEA  Dispense: 60 tablet; Refill: 2  Acute right-sided low back pain with right-sided sciatica Assessment & Plan: Sent flexeril 5 mg daily.  Orders: -     Cyclobenzaprine HCl; Take 1 tablet (5 mg total) by mouth 3 (three) times daily as needed. for muscle spams  Dispense: 270 tablet; Refill: 1  Other orders -     Topiramate; Take 1 tablet (100 mg total) by mouth at bedtime.  Dispense: 90 tablet; Refill: 3 -     Omeprazole; Take 1 capsule (40 mg total) by mouth daily.  Dispense: 90 capsule; Refill: 0 -     Methylphenidate HCl ER (CD); Take 1 capsule (50 mg total) by mouth every morning.  Dispense: 30 capsule; Refill: 0     Meds ordered this encounter  Medications   amitriptyline (ELAVIL) 100 MG tablet    Sig: Take 1 tablet (100 mg total) by mouth at bedtime.    Dispense:  90 tablet    Refill:  1   buPROPion (WELLBUTRIN SR) 150 MG 12 hr tablet    Sig: Take 1 tablet (150 mg total) by mouth daily.    Dispense:  90 tablet    Refill:  1    butalbital-acetaminophen-caffeine (FIORICET) 50-325-40 MG tablet    Sig: TAKE 1 TO 2 TABLETS BY MOUTH EVERY 6 HOURS AS NEEDED FOR HEADACHE    Dispense:  30 tablet    Refill:  2   clonazePAM (KLONOPIN) 0.5 MG tablet    Sig: Take 1 tablet (0.5 mg total) by mouth 2 (two) times daily as needed. for anxiety    Dispense:  60 tablet    Refill:  3   cyclobenzaprine (FLEXERIL) 5 MG tablet    Sig: Take 1 tablet (5 mg total) by mouth 3 (three) times daily as needed. for muscle spams    Dispense:  270 tablet    Refill:  1   DULoxetine (CYMBALTA) 60 MG capsule    Sig: Take 1 capsule (60 mg total) by mouth 2 (two) times daily.    Dispense:  180 capsule    Refill:  1   promethazine (PHENERGAN) 12.5 MG tablet    Sig: TAKE 1 TABLET BY MOUTH EVERY 6 HOURS AS NEEDED FOR NAUSEA    Dispense:  60 tablet    Refill:  2   topiramate (TOPAMAX) 100 MG tablet    Sig: Take 1 tablet (100 mg total) by mouth at bedtime.    Dispense:  90 tablet    Refill:  3   omeprazole (PRILOSEC) 40 MG capsule    Sig: Take 1 capsule (40 mg total) by mouth daily.    Dispense:  90 capsule    Refill:  0   methylphenidate (METADATE CD) 50 MG CR capsule    Sig: Take 1 capsule (50 mg total) by mouth every morning.    Dispense:  30 capsule    Refill:  0    No orders of the defined types were placed in this encounter.    Follow-up: Return in about 3 months (around 06/30/2023) for chronic follow up.   I,Albertina Leise,acting as a Neurosurgeon for Blane Ohara, MD.,have documented all relevant documentation on the behalf of Blane Ohara, MD,as directed by  Blane Ohara, MD while in the presence of Blane Ohara, MD.   Clayborn Bigness I Leal-Borjas,acting as a scribe for Blane Ohara, MD.,have documented all relevant documentation on the behalf of Blane Ohara, MD,as directed by  Blane Ohara, MD while in the presence of Blane Ohara, MD.    An After Visit Summary was printed and given to the patient.  Blane Ohara, MD [facility] [phone]

## 2023-03-30 ENCOUNTER — Ambulatory Visit (INDEPENDENT_AMBULATORY_CARE_PROVIDER_SITE_OTHER): Payer: 59 | Admitting: Family Medicine

## 2023-03-30 VITALS — BP 124/80 | HR 99 | Temp 98.0°F | Ht 61.0 in | Wt 145.0 lb

## 2023-03-30 DIAGNOSIS — N3941 Urge incontinence: Secondary | ICD-10-CM

## 2023-03-30 DIAGNOSIS — F5101 Primary insomnia: Secondary | ICD-10-CM | POA: Diagnosis not present

## 2023-03-30 DIAGNOSIS — M797 Fibromyalgia: Secondary | ICD-10-CM

## 2023-03-30 DIAGNOSIS — F330 Major depressive disorder, recurrent, mild: Secondary | ICD-10-CM

## 2023-03-30 DIAGNOSIS — F988 Other specified behavioral and emotional disorders with onset usually occurring in childhood and adolescence: Secondary | ICD-10-CM | POA: Diagnosis not present

## 2023-03-30 DIAGNOSIS — G43009 Migraine without aura, not intractable, without status migrainosus: Secondary | ICD-10-CM

## 2023-03-30 DIAGNOSIS — M5441 Lumbago with sciatica, right side: Secondary | ICD-10-CM | POA: Diagnosis not present

## 2023-03-30 DIAGNOSIS — K219 Gastro-esophageal reflux disease without esophagitis: Secondary | ICD-10-CM

## 2023-03-30 MED ORDER — PROMETHAZINE HCL 12.5 MG PO TABS: ORAL_TABLET | ORAL | 2 refills | Status: AC

## 2023-03-30 MED ORDER — DULOXETINE HCL 60 MG PO CPEP: 60.0000 mg | ORAL_CAPSULE | Freq: Two times a day (BID) | ORAL | 1 refills | Status: DC

## 2023-03-30 MED ORDER — BUTALBITAL-APAP-CAFFEINE 50-325-40 MG PO TABS: ORAL_TABLET | ORAL | 2 refills | Status: DC

## 2023-03-30 MED ORDER — METHYLPHENIDATE HCL ER (CD) 50 MG PO CPCR
50.0000 mg | ORAL_CAPSULE | ORAL | 0 refills | Status: DC
Start: 2023-03-30 — End: 2023-04-16

## 2023-03-30 MED ORDER — CLONAZEPAM 0.5 MG PO TABS: 0.5000 mg | ORAL_TABLET | Freq: Two times a day (BID) | ORAL | 3 refills | Status: DC | PRN

## 2023-03-30 MED ORDER — CYCLOBENZAPRINE HCL 5 MG PO TABS: 5.0000 mg | ORAL_TABLET | Freq: Three times a day (TID) | ORAL | 1 refills | Status: DC | PRN

## 2023-03-30 MED ORDER — TOPIRAMATE 100 MG PO TABS: 100.0000 mg | ORAL_TABLET | Freq: Every day | ORAL | 3 refills | Status: DC

## 2023-03-30 MED ORDER — AMITRIPTYLINE HCL 100 MG PO TABS: 100.0000 mg | ORAL_TABLET | Freq: Every day | ORAL | 1 refills | Status: DC

## 2023-03-30 MED ORDER — OMEPRAZOLE 40 MG PO CPDR: 40.0000 mg | DELAYED_RELEASE_CAPSULE | Freq: Every day | ORAL | 0 refills | Status: DC

## 2023-03-30 MED ORDER — BUPROPION HCL ER (SR) 150 MG PO TB12: 150.0000 mg | ORAL_TABLET | Freq: Every day | ORAL | 1 refills | Status: DC

## 2023-03-30 NOTE — Patient Instructions (Signed)
 VISIT SUMMARY:  During today's visit, we discussed your ongoing issues with acid reflux, fibromyalgia, migraines, anxiety, and depression. We also addressed your recent weight loss and its impact on your muscle mass and overall health. Additionally, we talked about your current medications and made some adjustments to better manage your symptoms.  YOUR PLAN:  -GASTROESOPHAGEAL REFLUX DISEASE (GERD): GERD is a condition where stomach acid frequently flows back into the tube connecting your mouth and stomach, causing discomfort. We will start you on omeprazole (Prilosec) to help manage your acid reflux symptoms.  -FIBROMYALGIA: Fibromyalgia is a long-term condition that causes pain all over the body. We will continue your current medications: amitriptyline, duloxetine, ibuprofen, and cyclobenzaprine to help manage your symptoms.  -MIGRAINE: Migraines are severe headaches often accompanied by nausea and sensitivity to light and sound. We have adjusted your topiramate dosage to 100 mg once at night and you should continue taking Imitrex and Phenergan as needed.  -ANXIETY AND DEPRESSION: Anxiety and depression are mental health conditions that affect your mood and overall well-being. You should continue taking Wellbutrin as currently prescribed.  -ATTENTION DEFICIT HYPERACTIVITY DISORDER (ADHD): ADHD is a condition that includes symptoms such as inattentiveness, hyperactivity, and impulsiveness. We will switch you to extended-release methylphenidate 50 mg daily in am.  INSTRUCTIONS:  Please follow up with [place] if you experience any new or worsening symptoms. We will also provide a letter detailing your medical condition for your spouse's legal issue. Continue with your current medications and the new prescriptions as discussed. If you have any questions or concerns, do not hesitate to contact our office.

## 2023-03-31 NOTE — Assessment & Plan Note (Signed)
 Topiramate dosage previously incorrect. Discussed side effects including sleepiness and cognitive effects. - Adjust topiramate dosage to 100 mg once at night. - Continue Imitrex and Phenergan as needed for migraines.

## 2023-03-31 NOTE — Assessment & Plan Note (Signed)
 On Wellbutrin for anxiety and depression, taken at night. - Continue Wellbutrin as currently prescribed.

## 2023-03-31 NOTE — Assessment & Plan Note (Signed)
 Currently on methylphenidate due to shortage of 20 mg tablets. Discussed switching to extended-release formulation. - Switch to extended-release methylphenidate 50 mg daily.

## 2023-03-31 NOTE — Assessment & Plan Note (Signed)
 Sent flexeril 5 mg daily.

## 2023-03-31 NOTE — Assessment & Plan Note (Signed)
 No medicine

## 2023-03-31 NOTE — Assessment & Plan Note (Signed)
-  Continue amitriptyline

## 2023-03-31 NOTE — Assessment & Plan Note (Signed)
 Achiness and tiredness attributed to fibromyalgia. Cold weather may exacerbate symptoms. Lyrica not preferred due to weight gain. - Continue amitriptyline, duloxetine, ibuprofen, and cyclobenzaprine.

## 2023-04-01 ENCOUNTER — Encounter: Payer: Self-pay | Admitting: Family Medicine

## 2023-04-02 ENCOUNTER — Encounter: Payer: Self-pay | Admitting: Family Medicine

## 2023-04-02 ENCOUNTER — Telehealth: Payer: Self-pay

## 2023-04-02 ENCOUNTER — Other Ambulatory Visit: Payer: Self-pay

## 2023-04-02 DIAGNOSIS — F988 Other specified behavioral and emotional disorders with onset usually occurring in childhood and adolescence: Secondary | ICD-10-CM

## 2023-04-02 NOTE — Telephone Encounter (Signed)
 error

## 2023-04-13 ENCOUNTER — Ambulatory Visit: Admitting: Family Medicine

## 2023-04-15 NOTE — Progress Notes (Unsigned)
 Acute Office Visit  Subjective:    Patient ID: Anne Brown, female    DOB: [birth year], 53 y.o.   MRN: [id number]  Chief Complaint  Patient presents with   Nail Problem    HPI: Patient is in today for an irregular nail. She was removing her acrylics and then it seemed to be disformed and have some redness at the cuticle. She realize she had hit her nail earlier in the weekend, but it was too late to cancel the appointment.   ADD: I had sent an extended release ritalin but the cost was $85. She is requesting to go back on the immediate release ritalin she has been taking. She was on 20 mg three times a day, but the pharmacy did not have 20 mg so changed to 10 mg 2 oral twice daily.   Past Medical History:  Diagnosis Date   Abnormal Pap smear    ADHD    Anxiety    Family history of adverse reaction to anesthesia    mother n/v severe   Headache    migraines   Lower back pain    LSIL (low grade squamous intraepithelial lesion) on Pap smear 10/15/2007    Past Surgical History:  Procedure Laterality Date   ANTERIOR AND POSTERIOR REPAIR N/A 06/27/2018   Procedure: ANTERIOR (CYSTOCELE) AND POSTERIOR REPAIR (RECTOCELE);  Surgeon: Osborn Coho, MD;  Location: [facility];  Service: Gynecology;  Laterality: N/A;   DILATION AND CURETTAGE OF UTERUS  1992   DILATION AND CURETTAGE OF UTERUS  2009   ECTOPIC PREGNANCY SURGERY  01/2008   After BTL   HYSTEROSCOPY WITH NOVASURE N/A 06/27/2018   Procedure: HYSTEROSCOPY WITH NOVASURE;  Surgeon: Osborn Coho, MD;  Location: [facility];  Service: Gynecology;  Laterality: N/A;   TUBAL LIGATION      Family History  Problem Relation Age of Onset   Depression Mother     Social History   Socioeconomic History   Marital status: Married    Spouse name: Not on file   Number of children: Not on file   Years of education: Not on file   Highest education level: GED or equivalent  Occupational History   Not on  file  Tobacco Use   Smoking status: Never   Smokeless tobacco: Never  Vaping Use   Vaping status: Never Used  Substance and Sexual Activity   Alcohol use: Yes    Alcohol/week: 0.0 standard drinks of alcohol    Comment: 3 glasses a week    Drug use: No   Sexual activity: Yes    Birth control/protection: None, Surgical    Comment: 1st intercourse- 17, partners-2  Other Topics Concern   Not on file  Social History Narrative   Not on file   Social Drivers of Health   Financial Resource Strain: Low Risk  (08/28/2022)   Overall Financial Resource Strain (CARDIA)    Difficulty of Paying Living Expenses: Not hard at all  Food Insecurity: No Food Insecurity (08/28/2022)   Hunger Vital Sign    Worried About Running Out of Food in the Last Year: Never true    Ran Out of Food in the Last Year: Never true  Transportation Needs: No Transportation Needs (08/28/2022)   PRAPARE - Administrator, Civil Service (Medical): No    Lack of Transportation (Non-Medical): No  Physical Activity: Sufficiently Active (08/28/2022)   Exercise Vital Sign    Days of Exercise per Week: 3  days    Minutes of Exercise per Session: 50 min  Stress: No Stress Concern Present (08/28/2022)   [employer] of Occupational Health - Occupational Stress Questionnaire    Feeling of Stress : Not at all  Social Connections: Socially Integrated (08/28/2022)   Social Connection and Isolation Panel [NHANES]    Frequency of Communication with Friends and Family: Three times a week    Frequency of Social Gatherings with Friends and Family: Three times a week    Attends Religious Services: More than 4 times per year    Active Member of Clubs or Organizations: Yes    Attends Banker Meetings: More than 4 times per year    Marital Status: Married  Catering manager Violence: Not At Risk (03/02/2022)   Humiliation, Afraid, Rape, and Kick questionnaire    Fear of Current or Ex-Partner: No    Emotionally Abused:  No    Physically Abused: No    Sexually Abused: No    Outpatient Medications Prior to Visit  Medication Sig Dispense Refill   amitriptyline (ELAVIL) 100 MG tablet Take 1 tablet (100 mg total) by mouth at bedtime. 90 tablet 1   buPROPion (WELLBUTRIN SR) 150 MG 12 hr tablet Take 1 tablet (150 mg total) by mouth daily. 90 tablet 1   butalbital-acetaminophen-caffeine (FIORICET) 50-325-40 MG tablet TAKE 1 TO 2 TABLETS BY MOUTH EVERY 6 HOURS AS NEEDED FOR HEADACHE 30 tablet 2   cetirizine (ZYRTEC) 10 MG tablet Take 10 mg by mouth at bedtime.     clonazePAM (KLONOPIN) 0.5 MG tablet Take 1 tablet (0.5 mg total) by mouth 2 (two) times daily as needed. for anxiety 60 tablet 3   cyclobenzaprine (FLEXERIL) 5 MG tablet Take 1 tablet (5 mg total) by mouth 3 (three) times daily as needed. for muscle spams 270 tablet 1   DULoxetine (CYMBALTA) 60 MG capsule Take 1 capsule (60 mg total) by mouth 2 (two) times daily. 180 capsule 1   ibuprofen (ADVIL) 800 MG tablet Take 1 tablet (800 mg total) by mouth every 8 (eight) hours as needed. 90 tablet 2   meclizine (ANTIVERT) 25 MG tablet Take 1 tablet (25 mg total) by mouth every 6 (six) hours as needed for dizziness. 30 tablet 0   omeprazole (PRILOSEC) 40 MG capsule Take 1 capsule (40 mg total) by mouth daily. 90 capsule 0   OnabotulinumtoxinA, Cosmetic, (BOTOX COSMETIC IM)      promethazine (PHENERGAN) 12.5 MG tablet TAKE 1 TABLET BY MOUTH EVERY 6 HOURS AS NEEDED FOR NAUSEA 60 tablet 2   SUMAtriptan (IMITREX) 50 MG tablet One at onset of migraine and may repeat in 2 hours x 1, if headache persists or recurs. 10 tablet 1   topiramate (TOPAMAX) 100 MG tablet Take 1 tablet (100 mg total) by mouth at bedtime. 90 tablet 3   Vitamin D, Ergocalciferol, (DRISDOL) 1.25 MG (50000 UNIT) CAPS capsule Take 1 capsule (50,000 Units total) by mouth 3 (three) times a week. 12 capsule 3   methylphenidate (METADATE CD) 50 MG CR capsule Take 1 capsule (50 mg total) by mouth every  morning. 30 capsule 0   HYDROcodone bit-homatropine (HYDROMET) 5-1.5 MG/5ML syrup Take 5 mLs by mouth every 6 (six) hours as needed for cough. (Patient not taking: Reported on 04/16/2023) 120 mL 0   No facility-administered medications prior to visit.    No Active Allergies   Review of Systems  Constitutional:  Negative for fatigue and fever.  Objective:        04/16/2023    7:36 AM 03/30/2023    9:34 AM 01/10/2023    9:34 AM  Vitals with BMI  Height 5\' 1"  5\' 1"  5\' 1"   Weight 148 lbs 145 lbs 155 lbs  BMI 27.98 27.41 29.3  Systolic 120 124 604  Diastolic 64 80 70  Pulse 86 99 88    No data found.   Physical Exam Vitals reviewed.  Constitutional:      Appearance: Normal appearance.  Cardiovascular:     Rate and Rhythm: Normal rate and regular rhythm.     Heart sounds: Normal heart sounds.  Pulmonary:     Effort: Pulmonary effort is normal.     Breath sounds: Normal breath sounds.  Skin:    Comments: Nail fifth right finger. Peeling. Tender a little around the cuticle. No increased warmth. No edema.   Neurological:     Mental Status: She is alert.     Health Maintenance Due  Topic Date Due   DTaP/Tdap/Td (1 - Tdap) Never done   Colonoscopy  Never done   MAMMOGRAM  05/14/2021   COVID-19 Vaccine (6 - 2024-25 season) 09/24/2022    There are no preventive care reminders to display for this patient.   Lab Results  Component Value Date   TSH 2.790 08/28/2022   Lab Results  Component Value Date   WBC 6.2 08/28/2022   HGB 12.8 08/28/2022   HCT 38.5 08/28/2022   MCV 93 08/28/2022   PLT 328 08/28/2022   Lab Results  Component Value Date   NA 142 08/28/2022   K 4.8 08/28/2022   CO2 26 08/28/2022   GLUCOSE 94 08/28/2022   BUN 10 08/28/2022   CREATININE 0.65 08/28/2022   BILITOT 0.2 08/28/2022   ALKPHOS 105 08/28/2022   AST 24 08/28/2022   ALT 16 08/28/2022   PROT 6.5 08/28/2022   ALBUMIN 4.3 08/28/2022   CALCIUM 9.9 08/28/2022   ANIONGAP 7  06/24/2018   EGFR 107 08/28/2022   Lab Results  Component Value Date   CHOL 141 09/19/2021   Lab Results  Component Value Date   HDL 79 09/19/2021   Lab Results  Component Value Date   LDLCALC 49 09/19/2021   Lab Results  Component Value Date   TRIG 62 09/19/2021   Lab Results  Component Value Date   CHOLHDL 1.8 09/19/2021   No results found for: "HGBA1C"     Assessment & Plan:  Attention or concentration deficit Assessment & Plan: Change ER ritalin to ritalin 10 mg 2 oral twice daily.    Nail abnormality Assessment & Plan: Secondary to trauma and acrylics.  No further treatment unless developed erythema and fever.    Other orders -     Methylphenidate HCl; Take 1 tablet (10 mg total) by mouth 3 (three) times daily with meals.  Dispense: 60 tablet; Refill: 0    Meds ordered this encounter  Medications   methylphenidate (RITALIN) 10 MG tablet    Sig: Take 1 tablet (10 mg total) by mouth 3 (three) times daily with meals.    Dispense:  60 tablet    Refill:  0    No orders of the defined types were placed in this encounter.    Follow-up: No follow-ups on file.  An After Visit Summary was printed and given to the patient.  Blane Ohara, MD [facility] [phone]

## 2023-04-16 ENCOUNTER — Encounter: Payer: Self-pay | Admitting: Family Medicine

## 2023-04-16 ENCOUNTER — Ambulatory Visit (INDEPENDENT_AMBULATORY_CARE_PROVIDER_SITE_OTHER): Admitting: Family Medicine

## 2023-04-16 VITALS — BP 120/64 | HR 86 | Temp 97.8°F | Resp 16 | Ht 61.0 in | Wt 148.0 lb

## 2023-04-16 DIAGNOSIS — L609 Nail disorder, unspecified: Secondary | ICD-10-CM | POA: Diagnosis not present

## 2023-04-16 DIAGNOSIS — R41840 Attention and concentration deficit: Secondary | ICD-10-CM

## 2023-04-16 MED ORDER — METHYLPHENIDATE HCL 10 MG PO TABS: 10.0000 mg | ORAL_TABLET | Freq: Three times a day (TID) | ORAL | 0 refills | Status: DC

## 2023-04-16 NOTE — Assessment & Plan Note (Signed)
 Change ER ritalin to ritalin 10 mg 2 oral twice daily.

## 2023-04-16 NOTE — Assessment & Plan Note (Signed)
 Secondary to trauma and acrylics.  No further treatment unless developed erythema and fever.

## 2023-04-24 ENCOUNTER — Other Ambulatory Visit: Payer: Self-pay

## 2023-04-24 ENCOUNTER — Encounter: Payer: Self-pay | Admitting: Family Medicine

## 2023-04-24 MED ORDER — METHYLPHENIDATE HCL 10 MG PO TABS: 10.0000 mg | ORAL_TABLET | Freq: Three times a day (TID) | ORAL | 0 refills | Status: DC

## 2023-04-24 MED ORDER — METHYLPHENIDATE HCL 10 MG PO TABS: 20.0000 mg | ORAL_TABLET | Freq: Three times a day (TID) | ORAL | 0 refills | Status: DC

## 2023-04-24 NOTE — Progress Notes (Signed)
 Patient takes 2 tabs of 10 mg three times daily ie total of 180 tabs per month. Since she received a refill for 60 tabs which is a 10 day supply on 04/17/23, filled another 30 day supply ie 180 tabs to be picked up on or after 04/27/2023.  Thanks, Windell Moment MD

## 2023-05-01 ENCOUNTER — Telehealth: Payer: Self-pay

## 2023-05-01 NOTE — Telephone Encounter (Signed)
 Script is correct. Patient aware.  Copied from CRM [id number]. Topic: Clinical - Medication Question >> May 01, 2023 10:27 AM Patsy Lager T wrote: Reason for CRM: patient called as she had questions about the script for methylphenidate (RITALIN) 10 MG tablet. Patient said she is only suppose to take (2) 10mg  tablets 3x a day and the script was sent in for Sig: Take 2 tablets (20 mg total) by mouth 3 (three) times daily with meals. Please f/u with pharmacy.

## 2023-05-31 ENCOUNTER — Other Ambulatory Visit: Payer: Self-pay | Admitting: Family Medicine

## 2023-05-31 DIAGNOSIS — K219 Gastro-esophageal reflux disease without esophagitis: Secondary | ICD-10-CM

## 2023-06-03 ENCOUNTER — Other Ambulatory Visit: Payer: Self-pay

## 2023-06-04 MED ORDER — METHYLPHENIDATE HCL 10 MG PO TABS: 20.0000 mg | ORAL_TABLET | Freq: Three times a day (TID) | ORAL | 0 refills | Status: DC

## 2023-07-04 ENCOUNTER — Other Ambulatory Visit: Payer: Self-pay | Admitting: Family Medicine

## 2023-07-04 DIAGNOSIS — M797 Fibromyalgia: Secondary | ICD-10-CM

## 2023-07-06 ENCOUNTER — Ambulatory Visit (INDEPENDENT_AMBULATORY_CARE_PROVIDER_SITE_OTHER): Admitting: Family Medicine

## 2023-07-06 ENCOUNTER — Encounter: Payer: Self-pay | Admitting: Family Medicine

## 2023-07-06 VITALS — BP 118/76 | HR 85 | Temp 98.2°F | Ht 61.0 in | Wt 141.0 lb

## 2023-07-06 DIAGNOSIS — E538 Deficiency of other specified B group vitamins: Secondary | ICD-10-CM

## 2023-07-06 DIAGNOSIS — M797 Fibromyalgia: Secondary | ICD-10-CM | POA: Diagnosis not present

## 2023-07-06 DIAGNOSIS — M5441 Lumbago with sciatica, right side: Secondary | ICD-10-CM | POA: Diagnosis not present

## 2023-07-06 DIAGNOSIS — G43009 Migraine without aura, not intractable, without status migrainosus: Secondary | ICD-10-CM | POA: Diagnosis not present

## 2023-07-06 DIAGNOSIS — E559 Vitamin D deficiency, unspecified: Secondary | ICD-10-CM | POA: Diagnosis not present

## 2023-07-06 DIAGNOSIS — R5383 Other fatigue: Secondary | ICD-10-CM | POA: Diagnosis not present

## 2023-07-06 DIAGNOSIS — F330 Major depressive disorder, recurrent, mild: Secondary | ICD-10-CM | POA: Diagnosis not present

## 2023-07-06 DIAGNOSIS — R41840 Attention and concentration deficit: Secondary | ICD-10-CM

## 2023-07-06 MED ORDER — METHYLPHENIDATE HCL 10 MG PO TABS
20.0000 mg | ORAL_TABLET | Freq: Three times a day (TID) | ORAL | 0 refills | Status: DC
Start: 2023-07-06 — End: 2023-08-07

## 2023-07-06 MED ORDER — BUPROPION HCL ER (XL) 300 MG PO TB24: 300.0000 mg | ORAL_TABLET | Freq: Every day | ORAL | 1 refills | Status: DC

## 2023-07-06 MED ORDER — CYCLOBENZAPRINE HCL 5 MG PO TABS: 5.0000 mg | ORAL_TABLET | Freq: Three times a day (TID) | ORAL | 1 refills | Status: AC | PRN

## 2023-07-06 MED ORDER — DULOXETINE HCL 60 MG PO CPEP: 60.0000 mg | ORAL_CAPSULE | Freq: Two times a day (BID) | ORAL | 1 refills | Status: DC

## 2023-07-06 NOTE — Progress Notes (Unsigned)
 Subjective:  Patient ID: Anne Brown, female    DOB: 10-23-1970  Age: 53 y.o. MRN: [id number]  Chief Complaint  Patient presents with   Medical Management of Chronic Issues    HPI: Fibromyalgia: on amitriptyline  100 mg before bed, duloxetine  60 mg twice daily, cyclobenzaprine  5 mg three times a day as needed.   Depression: wellbutrin  sr 150 mg once daily in am. Clonazepam  0.5 mg twice daily as needed. Taking clonazepam  usually once daily.   B12 deficiency: was getting b12 shots every 2 weeks, but b12 was high last time so she discontinued it. No otc b12.    ADD: Ritalin  10 mg 2 oral three times a day.   Stopped topamax  due to memory issues. Helped with migraines, but did not tolerate. Just stopped last week.  Fatigue is worse. Migraines have not started back too bad yet.       07/06/2023    9:41 AM 03/30/2023    9:42 AM 11/29/2022    3:39 PM 08/28/2022    2:47 PM 06/22/2022    8:09 AM  Depression screen PHQ 2/9  Decreased Interest 0 0 0 0 0  Down, Depressed, Hopeless 1 1 0 1 1  PHQ - 2 Score 1 1 0 1 1  Altered sleeping 0 0 0 0 0  Tired, decreased energy 2 2 1 2 3   Change in appetite 0 0 0 2 1  Feeling bad or failure about yourself  1 0 0 2 1  Trouble concentrating 2 0 2 3 3   Moving slowly or fidgety/restless 1 0 1 1 0  Suicidal thoughts 0 0 0 0 0  PHQ-9 Score 7 3 4 11 9   Difficult doing work/chores Somewhat difficult Somewhat difficult Not difficult at all Very difficult Somewhat difficult        11/29/2022    3:39 PM  Fall Risk   Falls in the past year? 0  Number falls in past yr: 0  Injury with Fall? 0  Risk for fall due to : No Fall Risks  Follow up Falls evaluation completed    Patient Care Team: Sherre Clapper, MD as PCP - General (Family Medicine) Henry Slough, MD as Consulting Physician (Obstetrics and Gynecology)   Review of Systems  Constitutional:  Positive for fatigue. Negative for chills and fever.  HENT:  Positive for congestion. Negative for ear  pain, rhinorrhea and sore throat.   Respiratory:  Negative for cough and shortness of breath.   Cardiovascular:  Negative for chest pain.  Gastrointestinal:  Negative for abdominal pain, constipation, diarrhea, nausea and vomiting.  Genitourinary:  Positive for dysuria. Negative for urgency.  Musculoskeletal:  Positive for arthralgias and back pain. Negative for myalgias.  Neurological:  Negative for dizziness, weakness, light-headedness and headaches.  Psychiatric/Behavioral:  Positive for dysphoric mood and sleep disturbance (hypersomnolence.). The patient is nervous/anxious.     Current Outpatient Medications on File Prior to Visit  Medication Sig Dispense Refill   amitriptyline  (ELAVIL ) 100 MG tablet TAKE 1 TABLET BY MOUTH EVERYDAY AT BEDTIME 90 tablet 2   buPROPion  (WELLBUTRIN  SR) 150 MG 12 hr tablet Take 1 tablet (150 mg total) by mouth daily. 90 tablet 1   butalbital -acetaminophen -caffeine  (FIORICET) 50-325-40 MG tablet TAKE 1 TO 2 TABLETS BY MOUTH EVERY 6 HOURS AS NEEDED FOR HEADACHE 30 tablet 2   cetirizine (ZYRTEC) 10 MG tablet Take 10 mg by mouth at bedtime.     clonazePAM  (KLONOPIN ) 0.5 MG tablet Take 1 tablet (0.5 mg total)  by mouth 2 (two) times daily as needed. for anxiety 60 tablet 3   ibuprofen  (ADVIL ) 800 MG tablet Take 1 tablet (800 mg total) by mouth every 8 (eight) hours as needed. 90 tablet 2   meclizine  (ANTIVERT ) 25 MG tablet Take 1 tablet (25 mg total) by mouth every 6 (six) hours as needed for dizziness. 30 tablet 0   omeprazole  (PRILOSEC) 40 MG capsule TAKE 1 CAPSULE (40 MG TOTAL) BY MOUTH DAILY. 30 capsule 2   OnabotulinumtoxinA, Cosmetic, (BOTOX COSMETIC IM)      promethazine  (PHENERGAN ) 12.5 MG tablet TAKE 1 TABLET BY MOUTH EVERY 6 HOURS AS NEEDED FOR NAUSEA 60 tablet 2   SUMAtriptan  (IMITREX ) 50 MG tablet One at onset of migraine and may repeat in 2 hours x 1, if headache persists or recurs. 10 tablet 1   No current facility-administered medications on file  prior to visit.   Past Medical History:  Diagnosis Date   Abnormal Pap smear    ADHD    Anxiety    Family history of adverse reaction to anesthesia    mother n/v severe   Headache    migraines   Lower back pain    LSIL (low grade squamous intraepithelial lesion) on Pap smear 10/15/2007   Past Surgical History:  Procedure Laterality Date   ANTERIOR AND POSTERIOR REPAIR N/A 06/27/2018   Procedure: ANTERIOR (CYSTOCELE) AND POSTERIOR REPAIR (RECTOCELE);  Surgeon: Henry Slough, MD;  Location: [facility];  Service: Gynecology;  Laterality: N/A;   DILATION AND CURETTAGE OF UTERUS  1992   DILATION AND CURETTAGE OF UTERUS  2009   ECTOPIC PREGNANCY SURGERY  01/2008   After BTL   HYSTEROSCOPY WITH NOVASURE N/A 06/27/2018   Procedure: HYSTEROSCOPY WITH NOVASURE;  Surgeon: Henry Slough, MD;  Location: [facility];  Service: Gynecology;  Laterality: N/A;   TUBAL LIGATION      Family History  Problem Relation Age of Onset   Depression Mother    Social History   Socioeconomic History   Marital status: Married    Spouse name: Not on file   Number of children: Not on file   Years of education: Not on file   Highest education level: GED or equivalent  Occupational History   Not on file  Tobacco Use   Smoking status: Never   Smokeless tobacco: Never  Vaping Use   Vaping status: Never Used  Substance and Sexual Activity   Alcohol use: Yes    Alcohol/week: 0.0 standard drinks of alcohol    Comment: 3 glasses a week    Drug use: No   Sexual activity: Yes    Birth control/protection: None, Surgical    Comment: 1st intercourse- 17, partners-2  Other Topics Concern   Not on file  Social History Narrative   Not on file   Social Drivers of Health   Financial Resource Strain: Low Risk  (08/28/2022)   Overall Financial Resource Strain (CARDIA)    Difficulty of Paying Living Expenses: Not hard at all  Food Insecurity: No Food Insecurity (07/06/2023)    Hunger Vital Sign    Worried About Running Out of Food in the Last Year: Never true    Ran Out of Food in the Last Year: Never true  Transportation Needs: No Transportation Needs (07/06/2023)   PRAPARE - Administrator, Civil Service (Medical): No    Lack of Transportation (Non-Medical): No  Physical Activity: Sufficiently Active (08/28/2022)   Exercise Vital Sign  Days of Exercise per Week: 3 days    Minutes of Exercise per Session: 50 min  Stress: No Stress Concern Present (08/28/2022)   [employer] of Occupational Health - Occupational Stress Questionnaire    Feeling of Stress : Not at all  Social Connections: Socially Integrated (08/28/2022)   Social Connection and Isolation Panel    Frequency of Communication with Friends and Family: Three times a week    Frequency of Social Gatherings with Friends and Family: Three times a week    Attends Religious Services: More than 4 times per year    Active Member of Clubs or Organizations: Yes    Attends Banker Meetings: More than 4 times per year    Marital Status: Married    Objective:  BP 118/76   Pulse 85   Temp 98.2 F (36.8 C)   Ht 5' 1 (1.549 m)   Wt 141 lb (64 kg)   SpO2 96%   BMI 26.64 kg/m      07/06/2023    9:33 AM 04/16/2023    7:36 AM 03/30/2023    9:34 AM  BP/Weight  Systolic BP 118 120 124  Diastolic BP 76 64 80  Wt. (Lbs) 141 148 145  BMI 26.64 kg/m2 27.96 kg/m2 27.4 kg/m2    Physical Exam Vitals reviewed.  Constitutional:      Appearance: Normal appearance. She is normal weight.  Neck:     Vascular: No carotid bruit.   Cardiovascular:     Rate and Rhythm: Normal rate and regular rhythm.     Heart sounds: Normal heart sounds.  Pulmonary:     Effort: Pulmonary effort is normal. No respiratory distress.     Breath sounds: Normal breath sounds.  Abdominal:     General: Abdomen is flat. Bowel sounds are normal.     Palpations: Abdomen is soft.     Tenderness: There is no  abdominal tenderness.   Musculoskeletal:        General: Tenderness (Fm trigger points.) present.   Neurological:     Mental Status: She is alert and oriented to person, place, and time.   Psychiatric:        Mood and Affect: Mood normal.        Behavior: Behavior normal.     {Perform Simple Foot Exam  Perform Detailed exam:1} {Insert foot Exam (Optional):30965}   Lab Results  Component Value Date   WBC 5.7 07/06/2023   HGB 13.3 07/06/2023   HCT 41.5 07/06/2023   PLT 332 07/06/2023   GLUCOSE 86 07/06/2023   CHOL 141 09/19/2021   TRIG 62 09/19/2021   HDL 79 09/19/2021   LDLCALC 49 09/19/2021   ALT 31 07/06/2023   AST 31 07/06/2023   NA 140 07/06/2023   K 4.8 07/06/2023   CL 101 07/06/2023   CREATININE 0.65 07/06/2023   BUN 7 07/06/2023   CO2 25 07/06/2023   TSH 3.020 07/06/2023   INR 0.9 06/22/2020      Assessment & Plan:  B12 deficiency Assessment & Plan: Check B12.   Orders: -     B12 and Folate Panel -     Methylmalonic acid, serum  Mild recurrent major depression (HCC) Assessment & Plan: Well controlled.  On Wellbutrin  for anxiety and depression, taken at night. - Continue Wellbutrin  as currently prescribed. On clonazepam .   Orders: -     DULoxetine  HCl; Take 1 capsule (60 mg total) by mouth 2 (two) times daily.  Dispense:  180 capsule; Refill: 1 -     buPROPion  HCl ER (XL); Take 1 tablet (300 mg total) by mouth daily.  Dispense: 90 tablet; Refill: 1  Acute right-sided low back pain with right-sided sciatica  Fibromyalgia Assessment & Plan: Fairly well controlled.  - Continue amitriptyline , duloxetine , ibuprofen , and cyclobenzaprine .  Orders: -     Cyclobenzaprine  HCl; Take 1 tablet (5 mg total) by mouth 3 (three) times daily as needed. for muscle spams  Dispense: 270 tablet; Refill: 1 -     DULoxetine  HCl; Take 1 capsule (60 mg total) by mouth 2 (two) times daily.  Dispense: 180 capsule; Refill: 1  Vitamin D  deficiency Assessment &  Plan: Check vitamin d  level.   Orders: -     TSH -     VITAMIN D  25 Hydroxy (Vit-D Deficiency, Fractures)  Other fatigue -     CBC with Differential/Platelet -     Comprehensive metabolic panel with GFR -     TSH  Attention or concentration deficit Assessment & Plan: Well controlled.  At Ritalin  10 mg 2 oral twice three times a day.   Orders: -     Methylphenidate  HCl; Take 2 tablets (20 mg total) by mouth 3 (three) times daily with meals.  Dispense: 180 tablet; Refill: 0 -     TSH  Migraine without aura and without status migrainosus, not intractable Assessment & Plan: Failed on topamax .  Failed on fiorecet and imitrex .  On amitriptyline . Start on        Meds ordered this encounter  Medications   cyclobenzaprine  (FLEXERIL ) 5 MG tablet    Sig: Take 1 tablet (5 mg total) by mouth 3 (three) times daily as needed. for muscle spams    Dispense:  270 tablet    Refill:  1   DULoxetine  (CYMBALTA ) 60 MG capsule    Sig: Take 1 capsule (60 mg total) by mouth 2 (two) times daily.    Dispense:  180 capsule    Refill:  1   methylphenidate  (RITALIN ) 10 MG tablet    Sig: Take 2 tablets (20 mg total) by mouth 3 (three) times daily with meals.    Dispense:  180 tablet    Refill:  0   buPROPion  (WELLBUTRIN  XL) 300 MG 24 hr tablet    Sig: Take 1 tablet (300 mg total) by mouth daily.    Dispense:  90 tablet    Refill:  1    Orders Placed This Encounter  Procedures   CBC with Differential/Platelet   Comprehensive metabolic panel with GFR   TSH   B12 and Folate Panel   VITAMIN D  25 Hydroxy (Vit-D Deficiency, Fractures)   Methylmalonic acid, serum     Follow-up: No follow-ups on file.   I,Katherina A Bramblett,acting as a scribe for Abigail Free, MD.,have documented all relevant documentation on the behalf of Abigail Free, MD,as directed by  Abigail Free, MD while in the presence of Abigail Free, MD.   An After Visit Summary was printed and given to the patient.  Abigail Free, MD [facility] [phone]

## 2023-07-09 ENCOUNTER — Ambulatory Visit: Payer: Self-pay | Admitting: Family Medicine

## 2023-07-09 LAB — COMPREHENSIVE METABOLIC PANEL WITH GFR
ALT: 31 IU/L (ref 0–32)
AST: 31 IU/L (ref 0–40)
Albumin: 4.4 g/dL (ref 3.8–4.9)
Alkaline Phosphatase: 106 IU/L (ref 44–121)
BUN/Creatinine Ratio: 11 (ref 9–23)
BUN: 7 mg/dL (ref 6–24)
Bilirubin Total: 0.3 mg/dL (ref 0.0–1.2)
CO2: 25 mmol/L (ref 20–29)
Calcium: 9.7 mg/dL (ref 8.7–10.2)
Chloride: 101 mmol/L (ref 96–106)
Creatinine, Ser: 0.65 mg/dL (ref 0.57–1.00)
Globulin, Total: 2.5 g/dL (ref 1.5–4.5)
Glucose: 86 mg/dL (ref 70–99)
Potassium: 4.8 mmol/L (ref 3.5–5.2)
Sodium: 140 mmol/L (ref 134–144)
Total Protein: 6.9 g/dL (ref 6.0–8.5)
eGFR: 106 mL/min/{1.73_m2} (ref >59)

## 2023-07-09 LAB — CBC WITH DIFFERENTIAL/PLATELET
Basophils Absolute: 0 10*3/uL (ref 0.0–0.2)
Basos: 1 %
EOS (ABSOLUTE): 0.1 10*3/uL (ref 0.0–0.4)
Eos: 2 %
Hematocrit: 41.5 % (ref 34.0–46.6)
Hemoglobin: 13.3 g/dL (ref 11.1–15.9)
Immature Grans (Abs): 0 10*3/uL (ref 0.0–0.1)
Immature Granulocytes: 0 %
Lymphocytes Absolute: 1.2 10*3/uL (ref 0.7–3.1)
Lymphs: 21 %
MCH: 31.1 pg (ref 26.6–33.0)
MCHC: 32 g/dL (ref 31.5–35.7)
MCV: 97 fL (ref 79–97)
Monocytes Absolute: 0.4 10*3/uL (ref 0.1–0.9)
Monocytes: 6 %
Neutrophils Absolute: 4 10*3/uL (ref 1.4–7.0)
Neutrophils: 70 %
Platelets: 332 10*3/uL (ref 150–450)
RBC: 4.28 x10E6/uL (ref 3.77–5.28)
RDW: 12.4 % (ref 11.7–15.4)
WBC: 5.7 10*3/uL (ref 3.4–10.8)

## 2023-07-09 LAB — B12 AND FOLATE PANEL
Folate: 9.2 ng/mL (ref >3.0)
Vitamin B-12: 411 pg/mL (ref 232–1245)

## 2023-07-09 LAB — TSH: TSH: 3.02 u[IU]/mL (ref 0.450–4.500)

## 2023-07-09 LAB — METHYLMALONIC ACID, SERUM: Methylmalonic Acid: 155 nmol/L (ref 0–378)

## 2023-07-09 LAB — VITAMIN D 25 HYDROXY (VIT D DEFICIENCY, FRACTURES): Vit D, 25-Hydroxy: 50.3 ng/mL (ref 30.0–100.0)

## 2023-07-12 NOTE — Assessment & Plan Note (Signed)
 Well controlled.  At Ritalin  10 mg 2 oral twice three times a day.

## 2023-07-12 NOTE — Assessment & Plan Note (Signed)
 Fairly well controlled.  - Continue amitriptyline , duloxetine , ibuprofen , and cyclobenzaprine .

## 2023-07-12 NOTE — Assessment & Plan Note (Signed)
Check vitamin d level

## 2023-07-12 NOTE — Assessment & Plan Note (Signed)
 Failed on topamax .  Failed on fiorecet and imitrex .  On amitriptyline . Start on

## 2023-07-12 NOTE — Assessment & Plan Note (Addendum)
 Check B12

## 2023-07-12 NOTE — Assessment & Plan Note (Signed)
 Well controlled.  On Wellbutrin  for anxiety and depression, taken at night. - Continue Wellbutrin  as currently prescribed. On clonazepam .

## 2023-07-14 NOTE — Assessment & Plan Note (Signed)
 Check labs

## 2023-07-16 ENCOUNTER — Encounter: Payer: Self-pay | Admitting: Family Medicine

## 2023-07-16 NOTE — Telephone Encounter (Signed)
 Please be sure we have qlipta samples for Anne Brown and give her the discount coupon. Thank you.

## 2023-07-16 NOTE — Telephone Encounter (Signed)
 Patient samples have been put out for her to pick up.

## 2023-07-23 DIAGNOSIS — Z01419 Encounter for gynecological examination (general) (routine) without abnormal findings: Secondary | ICD-10-CM | POA: Diagnosis not present

## 2023-07-23 DIAGNOSIS — Z8742 Personal history of other diseases of the female genital tract: Secondary | ICD-10-CM | POA: Diagnosis not present

## 2023-07-23 DIAGNOSIS — Z1231 Encounter for screening mammogram for malignant neoplasm of breast: Secondary | ICD-10-CM | POA: Diagnosis not present

## 2023-07-23 LAB — HM MAMMOGRAPHY

## 2023-07-23 LAB — HM PAP SMEAR: HM Pap smear: NORMAL

## 2023-07-30 ENCOUNTER — Encounter: Payer: Self-pay | Admitting: Family Medicine

## 2023-07-30 DIAGNOSIS — M545 Low back pain, unspecified: Secondary | ICD-10-CM

## 2023-08-07 ENCOUNTER — Other Ambulatory Visit: Payer: Self-pay

## 2023-08-07 ENCOUNTER — Encounter: Payer: Self-pay | Admitting: Family Medicine

## 2023-08-07 DIAGNOSIS — R41840 Attention and concentration deficit: Secondary | ICD-10-CM

## 2023-08-07 MED ORDER — METHYLPHENIDATE HCL 10 MG PO TABS
20.0000 mg | ORAL_TABLET | Freq: Three times a day (TID) | ORAL | 0 refills | Status: DC
Start: 2023-08-07 — End: 2023-09-10

## 2023-08-12 ENCOUNTER — Ambulatory Visit (HOSPITAL_BASED_OUTPATIENT_CLINIC_OR_DEPARTMENT_OTHER)
Admission: RE | Admit: 2023-08-12 | Discharge: 2023-08-12 | Disposition: A | Discharge status: Home / Self Care | Source: Ambulatory Visit | Attending: Family Medicine | Admitting: Family Medicine

## 2023-08-12 DIAGNOSIS — G8929 Other chronic pain: Secondary | ICD-10-CM | POA: Insufficient documentation

## 2023-08-12 DIAGNOSIS — M5136 Other intervertebral disc degeneration, lumbar region with discogenic back pain only: Secondary | ICD-10-CM | POA: Diagnosis not present

## 2023-08-12 DIAGNOSIS — M5126 Other intervertebral disc displacement, lumbar region: Secondary | ICD-10-CM | POA: Diagnosis not present

## 2023-08-12 DIAGNOSIS — M5137 Other intervertebral disc degeneration, lumbosacral region with discogenic back pain only: Secondary | ICD-10-CM | POA: Diagnosis not present

## 2023-08-12 DIAGNOSIS — M48061 Spinal stenosis, lumbar region without neurogenic claudication: Secondary | ICD-10-CM | POA: Diagnosis not present

## 2023-08-12 DIAGNOSIS — M545 Low back pain, unspecified: Secondary | ICD-10-CM | POA: Insufficient documentation

## 2023-08-14 ENCOUNTER — Ambulatory Visit: Payer: Self-pay | Admitting: Family Medicine

## 2023-08-22 ENCOUNTER — Other Ambulatory Visit: Payer: Self-pay

## 2023-08-22 DIAGNOSIS — M545 Low back pain, unspecified: Secondary | ICD-10-CM

## 2023-08-23 ENCOUNTER — Other Ambulatory Visit (HOSPITAL_BASED_OUTPATIENT_CLINIC_OR_DEPARTMENT_OTHER): Admitting: Radiology

## 2023-08-24 ENCOUNTER — Ambulatory Visit (INDEPENDENT_AMBULATORY_CARE_PROVIDER_SITE_OTHER): Admitting: Family Medicine

## 2023-08-24 VITALS — BP 130/70 | HR 89 | Temp 98.2°F | Ht 61.0 in | Wt 140.0 lb

## 2023-08-24 DIAGNOSIS — M5442 Lumbago with sciatica, left side: Secondary | ICD-10-CM | POA: Diagnosis not present

## 2023-08-24 DIAGNOSIS — G8929 Other chronic pain: Secondary | ICD-10-CM | POA: Diagnosis not present

## 2023-08-24 DIAGNOSIS — K219 Gastro-esophageal reflux disease without esophagitis: Secondary | ICD-10-CM

## 2023-08-24 DIAGNOSIS — M5441 Lumbago with sciatica, right side: Secondary | ICD-10-CM | POA: Diagnosis not present

## 2023-08-24 MED ORDER — OMEPRAZOLE 40 MG PO CPDR
40.0000 mg | DELAYED_RELEASE_CAPSULE | Freq: Every day | ORAL | 2 refills | Status: AC
Start: 2023-08-24 — End: ?

## 2023-08-24 MED ORDER — GABAPENTIN 300 MG PO CAPS: 300.0000 mg | ORAL_CAPSULE | Freq: Three times a day (TID) | ORAL | 3 refills | Status: DC

## 2023-08-24 NOTE — Progress Notes (Signed)
 Acute Office Visit  Subjective:    Patient ID: Rikita Grabert, female    DOB: February 21, 1970, 53 y.o.   MRN: [id number]  Chief Complaint  Patient presents with   Back Pain    HPI: Patient is in today for back pain: MRI was abnormal. See below.   IMPRESSION: 1. 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 Some potential for neural compression in the lateral recesses. 2. L3-4: Endplate osteophytes and shallow protrusion of the disc, worsened since 2022. Moderate stenosis with potential for neural compression in the lateral recesses. 3. L4-5: Worsened disc degeneration with loss of disc height and endplate changes. Endplate osteophytes and shallow protrusion of the disc. Moderate multifactorial stenosis that could cause neural compression on either or both sides. This has worsened since 2022. 4. L5-S1: Bulging of the disc more prominent towards the right. Mild right foraminal narrowing but no likely neural compression. Minimal worsening since 2022.  Patient has failed on physical therapy in the past. Failed on nonsteroid antiinflammatories, such as naproxen (aleve) or ibuprofen  (advil .), tylenol , amitriptyline .   She requested referral to [facility], but it does not appear to have been done yet. [facility] was not in network.   Past Medical History:  Diagnosis Date   Abnormal Pap smear    ADHD    Anxiety    Family history of adverse reaction to anesthesia    mother n/v severe   Headache    migraines   Lower back pain    LSIL (low grade squamous intraepithelial lesion) on Pap smear 10/15/2007    Past Surgical History:  Procedure Laterality Date   ANTERIOR AND POSTERIOR REPAIR N/A 06/27/2018   Procedure: ANTERIOR (CYSTOCELE) AND POSTERIOR REPAIR (RECTOCELE);  Surgeon: Henry Slough, MD;  Location: [facility];  Service: Gynecology;  Laterality: N/A;   DILATION AND CURETTAGE OF UTERUS  1992   DILATION AND CURETTAGE  OF UTERUS  2009   ECTOPIC PREGNANCY SURGERY  01/2008   After BTL   HYSTEROSCOPY WITH NOVASURE N/A 06/27/2018   Procedure: HYSTEROSCOPY WITH NOVASURE;  Surgeon: Henry Slough, MD;  Location: [facility];  Service: Gynecology;  Laterality: N/A;   TUBAL LIGATION      Family History  Problem Relation Age of Onset   Depression Mother     Social History   Socioeconomic History   Marital status: Married    Spouse name: Not on file   Number of children: Not on file   Years of education: Not on file   Highest education level: GED or equivalent  Occupational History   Not on file  Tobacco Use   Smoking status: Never   Smokeless tobacco: Never  Vaping Use   Vaping status: Never Used  Substance and Sexual Activity   Alcohol use: Yes    Alcohol/week: 0.0 standard drinks of alcohol    Comment: 3 glasses a week    Drug use: No   Sexual activity: Yes    Birth control/protection: None, Surgical    Comment: 1st intercourse- 17, partners-2  Other Topics Concern   Not on file  Social History Narrative   Not on file   Social Drivers of Health   Financial Resource Strain: Low Risk  (08/28/2022)   Overall Financial Resource Strain (CARDIA)    Difficulty of Paying Living Expenses: Not hard at all  Food Insecurity: No Food Insecurity (07/06/2023)   Hunger Vital Sign    Worried About Running Out of  Food in the Last Year: Never true    Ran Out of Food in the Last Year: Never true  Transportation Needs: No Transportation Needs (07/06/2023)   PRAPARE - Administrator, Civil Service (Medical): No    Lack of Transportation (Non-Medical): No  Physical Activity: Sufficiently Active (08/28/2022)   Exercise Vital Sign    Days of Exercise per Week: 3 days    Minutes of Exercise per Session: 50 min  Stress: No Stress Concern Present (08/28/2022)   [employer] of Occupational Health - Occupational Stress Questionnaire    Feeling of Stress : Not at all  Social  Connections: Socially Integrated (08/28/2022)   Social Connection and Isolation Panel    Frequency of Communication with Friends and Family: Three times a week    Frequency of Social Gatherings with Friends and Family: Three times a week    Attends Religious Services: More than 4 times per year    Active Member of Clubs or Organizations: Yes    Attends Banker Meetings: More than 4 times per year    Marital Status: Married  Catering manager Violence: Not At Risk (07/06/2023)   Humiliation, Afraid, Rape, and Kick questionnaire    Fear of Current or Ex-Partner: No    Emotionally Abused: No    Physically Abused: No    Sexually Abused: No    Outpatient Medications Prior to Visit  Medication Sig Dispense Refill   amitriptyline  (ELAVIL ) 100 MG tablet TAKE 1 TABLET BY MOUTH EVERYDAY AT BEDTIME 90 tablet 2   buPROPion  (WELLBUTRIN  SR) 150 MG 12 hr tablet Take 1 tablet (150 mg total) by mouth daily. 90 tablet 1   buPROPion  (WELLBUTRIN  XL) 300 MG 24 hr tablet Take 1 tablet (300 mg total) by mouth daily. 90 tablet 1   butalbital -acetaminophen -caffeine  (FIORICET) 50-325-40 MG tablet TAKE 1 TO 2 TABLETS BY MOUTH EVERY 6 HOURS AS NEEDED FOR HEADACHE 30 tablet 2   cetirizine (ZYRTEC) 10 MG tablet Take 10 mg by mouth at bedtime.     clonazePAM  (KLONOPIN ) 0.5 MG tablet Take 1 tablet (0.5 mg total) by mouth 2 (two) times daily as needed. for anxiety 60 tablet 3   cyclobenzaprine  (FLEXERIL ) 5 MG tablet Take 1 tablet (5 mg total) by mouth 3 (three) times daily as needed. for muscle spams 270 tablet 1   DULoxetine  (CYMBALTA ) 60 MG capsule Take 1 capsule (60 mg total) by mouth 2 (two) times daily. 180 capsule 1   ibuprofen  (ADVIL ) 800 MG tablet Take 1 tablet (800 mg total) by mouth every 8 (eight) hours as needed. 90 tablet 2   meclizine  (ANTIVERT ) 25 MG tablet Take 1 tablet (25 mg total) by mouth every 6 (six) hours as needed for dizziness. 30 tablet 0   methylphenidate  (RITALIN ) 10 MG tablet Take 2  tablets (20 mg total) by mouth 3 (three) times daily with meals. 180 tablet 0   OnabotulinumtoxinA, Cosmetic, (BOTOX COSMETIC IM)      promethazine  (PHENERGAN ) 12.5 MG tablet TAKE 1 TABLET BY MOUTH EVERY 6 HOURS AS NEEDED FOR NAUSEA 60 tablet 2   SUMAtriptan  (IMITREX ) 50 MG tablet One at onset of migraine and may repeat in 2 hours x 1, if headache persists or recurs. 10 tablet 1   omeprazole  (PRILOSEC) 40 MG capsule TAKE 1 CAPSULE (40 MG TOTAL) BY MOUTH DAILY. 30 capsule 2   No facility-administered medications prior to visit.    No Active Allergies  Review of Systems  Constitutional:  Negative for chills, fatigue and fever.  HENT:  Negative for congestion, ear pain, rhinorrhea and sore throat.   Respiratory:  Negative for cough and shortness of breath.   Cardiovascular:  Negative for chest pain.  Gastrointestinal:  Negative for abdominal pain, constipation, diarrhea, nausea and vomiting.  Genitourinary:  Negative for dysuria and urgency.  Musculoskeletal:  Positive for back pain. Negative for myalgias.  Neurological:  Negative for dizziness, weakness, light-headedness and headaches.  Psychiatric/Behavioral:  Negative for dysphoric mood. The patient is not nervous/anxious.        Objective:        08/24/2023   10:35 AM 07/06/2023    9:33 AM 04/16/2023    7:36 AM  Vitals with BMI  Height 5' 1 5' 1 5' 1  Weight 140 lbs 141 lbs 148 lbs  BMI 26.47 26.66 27.98  Systolic 130 118 879  Diastolic 70 76 64  Pulse 89 85 86    No data found.   Physical Exam Vitals reviewed.  Constitutional:      Appearance: Normal appearance. She is normal weight.  Cardiovascular:     Rate and Rhythm: Normal rate and regular rhythm.     Heart sounds: Normal heart sounds.  Pulmonary:     Effort: Pulmonary effort is normal. No respiratory distress.     Breath sounds: Normal breath sounds.  Musculoskeletal:        General: Tenderness (coccyx.) present.  Neurological:     Mental Status: She is  alert and oriented to person, place, and time.  Psychiatric:        Mood and Affect: Mood normal.        Behavior: Behavior normal.     Health Maintenance Due  Topic Date Due   DTaP/Tdap/Td (1 - Tdap) Never done   Hepatitis B Vaccines (1 of 3 - 19+ 3-dose series) Never done   Colonoscopy  Never done   Pneumococcal Vaccine: 50+ Years (1 of 1 - PCV) Never done   MAMMOGRAM  05/14/2021   COVID-19 Vaccine (7 - 2024-25 season) 09/24/2022   Zoster Vaccines- Shingrix (2 of 2) 04/25/2023   Cervical Cancer Screening (HPV/Pap Cotest)  08/03/2023       Topic Date Due   Hepatitis B Vaccines (1 of 3 - 19+ 3-dose series) Never done     Lab Results  Component Value Date   TSH 3.020 07/06/2023   Lab Results  Component Value Date   WBC 5.7 07/06/2023   HGB 13.3 07/06/2023   HCT 41.5 07/06/2023   MCV 97 07/06/2023   PLT 332 07/06/2023   Lab Results  Component Value Date   NA 140 07/06/2023   K 4.8 07/06/2023   CO2 25 07/06/2023   GLUCOSE 86 07/06/2023   BUN 7 07/06/2023   CREATININE 0.65 07/06/2023   BILITOT 0.3 07/06/2023   ALKPHOS 106 07/06/2023   AST 31 07/06/2023   ALT 31 07/06/2023   PROT 6.9 07/06/2023   ALBUMIN 4.4 07/06/2023   CALCIUM 9.7 07/06/2023   ANIONGAP 7 06/24/2018   EGFR 106 07/06/2023   Lab Results  Component Value Date   CHOL 141 09/19/2021   Lab Results  Component Value Date   HDL 79 09/19/2021   Lab Results  Component Value Date   LDLCALC 49 09/19/2021   Lab Results  Component Value Date   TRIG 62 09/19/2021   Lab Results  Component Value Date   CHOLHDL 1.8 09/19/2021   No results found for: HGBA1C  Assessment & Plan:  Chronic midline low back pain with bilateral sciatica Assessment & Plan: START ON GABAPENTIN  300 MG THREE TIMES A DAY.  REFERRAL NEUROSURGERY - SENT MESSAGE TO REFERRAL COORDINATOR.    Gastroesophageal reflux disease without esophagitis Assessment & Plan: REFILL OMEPRAZOLE .   Orders: -     Omeprazole ; Take  1 capsule (40 mg total) by mouth daily.  Dispense: 30 capsule; Refill: 2  Other orders -     Gabapentin ; Take 1 capsule (300 mg total) by mouth 3 (three) times daily.  Dispense: 90 capsule; Refill: 3     Meds ordered this encounter  Medications   omeprazole  (PRILOSEC) 40 MG capsule    Sig: Take 1 capsule (40 mg total) by mouth daily.    Dispense:  30 capsule    Refill:  2   gabapentin  (NEURONTIN ) 300 MG capsule    Sig: Take 1 capsule (300 mg total) by mouth 3 (three) times daily.    Dispense:  90 capsule    Refill:  3    No orders of the defined types were placed in this encounter.    Follow-up: No follow-ups on file.  An After Visit Summary was printed and given to the patient.  Abigail Free, MD [facility] [phone]

## 2023-08-26 ENCOUNTER — Encounter: Payer: Self-pay | Admitting: Family Medicine

## 2023-08-26 DIAGNOSIS — K219 Gastro-esophageal reflux disease without esophagitis: Secondary | ICD-10-CM | POA: Insufficient documentation

## 2023-08-26 NOTE — Assessment & Plan Note (Signed)
 REFILL OMEPRAZOLE

## 2023-08-26 NOTE — Assessment & Plan Note (Signed)
 START ON GABAPENTIN  300 MG THREE TIMES A DAY.  REFERRAL NEUROSURGERY - SENT MESSAGE TO REFERRAL COORDINATOR.

## 2023-08-28 ENCOUNTER — Encounter: Payer: Self-pay | Admitting: Family Medicine

## 2023-09-04 NOTE — Telephone Encounter (Signed)
 Copied from CRM [id number]. Topic: Referral - Status >> Sep 04, 2023 10:16 AM Zebedee SAUNDERS wrote: Reason for CRM: Received call [facility]. Joshua per [place] did not receive notes. Please provide to fax: [phone] ph: [phone].

## 2023-09-07 ENCOUNTER — Other Ambulatory Visit: Payer: Self-pay

## 2023-09-07 DIAGNOSIS — R41840 Attention and concentration deficit: Secondary | ICD-10-CM

## 2023-09-10 ENCOUNTER — Other Ambulatory Visit: Payer: Self-pay

## 2023-09-10 ENCOUNTER — Other Ambulatory Visit: Payer: Self-pay | Admitting: Family Medicine

## 2023-09-10 DIAGNOSIS — R41840 Attention and concentration deficit: Secondary | ICD-10-CM

## 2023-09-10 MED ORDER — METHYLPHENIDATE HCL 10 MG PO TABS: 20.0000 mg | ORAL_TABLET | Freq: Three times a day (TID) | ORAL | 0 refills | Status: DC

## 2023-09-10 NOTE — Telephone Encounter (Signed)
 Copied from CRM #[id number]. Topic: Clinical - Medication Refill >> Sep 10, 2023 12:46 PM Mia F wrote: Medication: methylphenidate  (RITALIN ) 10 MG tablet   Has the patient contacted their pharmacy? Yes (Agent: If no, request that the patient contact the pharmacy for the refill. If patient does not wish to contact the pharmacy document the reason why and proceed with request.) (Agent: If yes, when and what did the pharmacy advise?)  This is the patient's preferred pharmacy:  [id number] - [place], [place] - [address] [facility] [address] Phone: [phone] Fax: [phone]  Is this the correct pharmacy for this prescription? Yes If no, delete pharmacy and type the correct one.   Has the prescription been filled recently? Yes  Is the patient out of the medication? Yes  Has the patient been seen for an appointment in the last year OR does the patient have an upcoming appointment? Yes  Can we respond through MyChart? Yes  Agent: Please be advised that Rx refills may take up to 3 business days. We ask that you follow-up with your pharmacy.

## 2023-09-28 DIAGNOSIS — N6310 Unspecified lump in the right breast, unspecified quadrant: Secondary | ICD-10-CM | POA: Diagnosis not present

## 2023-10-03 DIAGNOSIS — M5136 Other intervertebral disc degeneration, lumbar region with discogenic back pain only: Secondary | ICD-10-CM | POA: Diagnosis not present

## 2023-10-03 DIAGNOSIS — M545 Low back pain, unspecified: Secondary | ICD-10-CM | POA: Diagnosis not present

## 2023-10-03 DIAGNOSIS — M51369 Other intervertebral disc degeneration, lumbar region without mention of lumbar back pain or lower extremity pain: Secondary | ICD-10-CM | POA: Diagnosis not present

## 2023-10-03 DIAGNOSIS — M48061 Spinal stenosis, lumbar region without neurogenic claudication: Secondary | ICD-10-CM | POA: Diagnosis not present

## 2023-10-03 DIAGNOSIS — M79604 Pain in right leg: Secondary | ICD-10-CM | POA: Diagnosis not present

## 2023-10-03 DIAGNOSIS — M47816 Spondylosis without myelopathy or radiculopathy, lumbar region: Secondary | ICD-10-CM | POA: Diagnosis not present

## 2023-10-11 DIAGNOSIS — N6310 Unspecified lump in the right breast, unspecified quadrant: Secondary | ICD-10-CM | POA: Diagnosis not present

## 2023-10-11 DIAGNOSIS — R928 Other abnormal and inconclusive findings on diagnostic imaging of breast: Secondary | ICD-10-CM | POA: Diagnosis not present

## 2023-10-11 NOTE — Progress Notes (Signed)
 "  Subjective:  Patient ID: Anne Brown, female    DOB: 11-12-1970  Age: 53 y.o. MRN: [id number]  Chief Complaint  Patient presents with   Medical Management of Chronic Issues    HPI: Discussed the use of AI scribe software for clinical note transcription with the patient, who gave verbal consent to proceed.  History of Present Illness Anne Brown is a 53 year old female with chronic back pain and migraines who presents with worsening balance issues and joint pain.  Back pain and balance disturbance - Chronic back pain with recent worsening and increased difficulty with balance - Pain described as 'everything's hurting' - Impaired mobility, unable to move quickly or twist body without pain - Concern for falls due to instability - Imaging reviewed by specialist revealed spinal narrowing in multiple planes, involving spinal cord and nerves  Migraine headaches - History of migraines with recent worsening in severity - Migraines begin with neck stiffness and radiate to the front of the head - Occasional auras associated with migraines - Current medications for acute relief include Imitrex , Fioricet, ibuprofen  800 mg, and muscle relaxers, but these are inconsistently effective - Restarted Topamax  for migraine prophylaxis, but it has not been effective and has caused memory impairment  Fibromyalgia and generalized pain - Generalized pain and stiffness throughout the body - Gabapentin  provides some relief for fibromyalgia pain; considering dose increase - Previously used Lyrica  but discontinued due to weight gain  Joint pain and stiffness - Significant joint pain and stiffness, especially in fingers and feet - Finger pain worsens with texting, leading to cramping and loss of strength - Feet pain present - Previous arthritis panels have not identified a specific etiology  Bruising and abnormal reflexes - Frequent painful bruising, especially when sitting or crossing legs - History  of painless bruising - Frequent ibuprofen  use for headaches may contribute to bruising - Specialist's note indicated abnormal Achilles tendon reflex, though she did not perceive this during examination  Bladder and bowel symptoms - Occasional bladder symptoms, particularly after intercourse - Bowel movements are regular       10/12/2023   10:40 AM 07/06/2023    9:41 AM 03/30/2023    9:42 AM 11/29/2022    3:39 PM 08/28/2022    2:47 PM  Depression screen PHQ 2/9  Decreased Interest 1 0 0 0 0  Down, Depressed, Hopeless 1 1 1  0 1  PHQ - 2 Score 2 1 1  0 1  Altered sleeping 1 0 0 0 0  Tired, decreased energy 1 2 2 1 2   Change in appetite 1 0 0 0 2  Feeling bad or failure about yourself  0 1 0 0 2  Trouble concentrating 2 2 0 2 3  Moving slowly or fidgety/restless 1 1 0 1 1  Suicidal thoughts 0 0 0 0 0  PHQ-9 Score 8 7 3 4 11   Difficult doing work/chores Not difficult at all Somewhat difficult Somewhat difficult Not difficult at all Very difficult        11/29/2022    3:39 PM  Fall Risk   Falls in the past year? 0  Number falls in past yr: 0  Injury with Fall? 0  Risk for fall due to : No Fall Risks  Follow up Falls evaluation completed    Patient Care Team: Sherre Clapper, MD as PCP - General (Family Medicine) Henry Slough, MD as Consulting Physician (Obstetrics and Gynecology)   Review of Systems  All other systems reviewed and  are negative.   Current Outpatient Medications on File Prior to Visit  Medication Sig Dispense Refill   amitriptyline  (ELAVIL ) 100 MG tablet TAKE 1 TABLET BY MOUTH EVERYDAY AT BEDTIME 90 tablet 2   buPROPion  (WELLBUTRIN  XL) 300 MG 24 hr tablet Take 1 tablet (300 mg total) by mouth daily. 90 tablet 1   butalbital -acetaminophen -caffeine  (FIORICET) 50-325-40 MG tablet TAKE 1 TO 2 TABLETS BY MOUTH EVERY 6 HOURS AS NEEDED FOR HEADACHE 30 tablet 2   cetirizine (ZYRTEC) 10 MG tablet Take 10 mg by mouth at bedtime.     cyclobenzaprine  (FLEXERIL ) 5 MG tablet  Take 1 tablet (5 mg total) by mouth 3 (three) times daily as needed. for muscle spams 270 tablet 1   DULoxetine  (CYMBALTA ) 60 MG capsule Take 1 capsule (60 mg total) by mouth 2 (two) times daily. 180 capsule 1   gabapentin  (NEURONTIN ) 300 MG capsule Take 1 capsule (300 mg total) by mouth 3 (three) times daily. 90 capsule 3   ibuprofen  (ADVIL ) 800 MG tablet Take 1 tablet (800 mg total) by mouth every 8 (eight) hours as needed. 90 tablet 2   meclizine  (ANTIVERT ) 25 MG tablet Take 1 tablet (25 mg total) by mouth every 6 (six) hours as needed for dizziness. 30 tablet 0   omeprazole  (PRILOSEC) 40 MG capsule Take 1 capsule (40 mg total) by mouth daily. 30 capsule 2   OnabotulinumtoxinA, Cosmetic, (BOTOX COSMETIC IM)      promethazine  (PHENERGAN ) 12.5 MG tablet TAKE 1 TABLET BY MOUTH EVERY 6 HOURS AS NEEDED FOR NAUSEA 60 tablet 2   SUMAtriptan  (IMITREX ) 50 MG tablet One at onset of migraine and may repeat in 2 hours x 1, if headache persists or recurs. 10 tablet 1   No current facility-administered medications on file prior to visit.   Past Medical History:  Diagnosis Date   Abnormal Pap smear    ADHD    Anxiety    Family history of adverse reaction to anesthesia    mother n/v severe   Headache    migraines   Lower back pain    LSIL (low grade squamous intraepithelial lesion) on Pap smear 10/15/2007   Past Surgical History:  Procedure Laterality Date   ANTERIOR AND POSTERIOR REPAIR N/A 06/27/2018   Procedure: ANTERIOR (CYSTOCELE) AND POSTERIOR REPAIR (RECTOCELE);  Surgeon: Henry Slough, MD;  Location: [facility];  Service: Gynecology;  Laterality: N/A;   DILATION AND CURETTAGE OF UTERUS  1992   DILATION AND CURETTAGE OF UTERUS  2009   ECTOPIC PREGNANCY SURGERY  01/2008   After BTL   HYSTEROSCOPY WITH NOVASURE N/A 06/27/2018   Procedure: HYSTEROSCOPY WITH NOVASURE;  Surgeon: Henry Slough, MD;  Location: [facility];  Service: Gynecology;  Laterality: N/A;    TUBAL LIGATION      Family History  Problem Relation Age of Onset   Depression Mother    Social History   Socioeconomic History   Marital status: Married    Spouse name: Not on file   Number of children: Not on file   Years of education: Not on file   Highest education level: GED or equivalent  Occupational History   Not on file  Tobacco Use   Smoking status: Never   Smokeless tobacco: Never  Vaping Use   Vaping status: Never Used  Substance and Sexual Activity   Alcohol use: Yes    Alcohol/week: 0.0 standard drinks of alcohol    Comment: 3 glasses a week  Drug use: No   Sexual activity: Yes    Birth control/protection: None, Surgical    Comment: 1st intercourse- 17, partners-2  Other Topics Concern   Not on file  Social History Narrative   Not on file   Social Drivers of Health   Financial Resource Strain: Low Risk  (08/28/2022)   Overall Financial Resource Strain (CARDIA)    Difficulty of Paying Living Expenses: Not hard at all  Food Insecurity: No Food Insecurity (07/06/2023)   Hunger Vital Sign    Worried About Running Out of Food in the Last Year: Never true    Ran Out of Food in the Last Year: Never true  Transportation Needs: No Transportation Needs (07/06/2023)   PRAPARE - Administrator, Civil Service (Medical): No    Lack of Transportation (Non-Medical): No  Physical Activity: Sufficiently Active (08/28/2022)   Exercise Vital Sign    Days of Exercise per Week: 3 days    Minutes of Exercise per Session: 50 min  Stress: No Stress Concern Present (08/28/2022)   [employer] of Occupational Health - Occupational Stress Questionnaire    Feeling of Stress : Not at all  Social Connections: Socially Integrated (08/28/2022)   Social Connection and Isolation Panel    Frequency of Communication with Friends and Family: Three times a week    Frequency of Social Gatherings with Friends and Family: Three times a week    Attends Religious Services: More  than 4 times per year    Active Member of Clubs or Organizations: Yes    Attends Engineer, Structural: More than 4 times per year    Marital Status: Married    Objective:  BP 110/60 (BP Location: Left Arm, Patient Position: Sitting)   Pulse 89   Temp (!) 97.1 F (36.2 C) (Temporal)   Ht 5' 1 (1.549 m)   Wt 136 lb (61.7 kg)   SpO2 98%   BMI 25.70 kg/m      10/12/2023   10:44 AM 08/24/2023   10:35 AM 07/06/2023    9:33 AM  BP/Weight  Systolic BP 110 130 118  Diastolic BP 60 70 76  Wt. (Lbs) 136 140 141  BMI 25.7 kg/m2 26.45 kg/m2 26.64 kg/m2    Physical Exam Vitals reviewed.  Constitutional:      Appearance: Normal appearance. She is normal weight.  Neck:     Vascular: No carotid bruit.  Cardiovascular:     Rate and Rhythm: Normal rate and regular rhythm.     Heart sounds: Normal heart sounds.  Pulmonary:     Effort: Pulmonary effort is normal. No respiratory distress.     Breath sounds: Normal breath sounds.  Abdominal:     General: Abdomen is flat. Bowel sounds are normal.     Palpations: Abdomen is soft.     Tenderness: There is no abdominal tenderness.  Musculoskeletal:        General: Tenderness (lumbar) present.     Comments: FM trigger points.  Neurological:     Mental Status: She is alert and oriented to person, place, and time.  Psychiatric:        Mood and Affect: Mood normal.        Behavior: Behavior normal.         Lab Results  Component Value Date   WBC 6.2 10/12/2023   HGB 13.2 10/12/2023   HCT 40.3 10/12/2023   PLT 392 10/12/2023   GLUCOSE 83 10/12/2023   CHOL  141 09/19/2021   TRIG 62 09/19/2021   HDL 79 09/19/2021   LDLCALC 49 09/19/2021   ALT 40 (H) 10/12/2023   AST 31 10/12/2023   NA 141 10/12/2023   K 4.0 10/12/2023   CL 104 10/12/2023   CREATININE 0.86 10/12/2023   BUN 13 10/12/2023   CO2 22 10/12/2023   TSH 3.020 07/06/2023   INR 0.9 10/12/2023      Assessment & Plan:  Chronic midline low back pain,  unspecified whether sciatica present Assessment & Plan: - Initiate physical therapy in [place] to improve balance and manage pain. - Order CT scan to assess bone density and anatomy.   Migraine without aura and without status migrainosus, not intractable Assessment & Plan: - Discontinue Topamax  by tapering off over a week. - Prescribe Qulipta  once daily for migraine prevention. - Provide Ubrelvy  samples for acute migraine treatment. - Continue Imitrex  for acute migraine treatment if Ubrelvy  is ineffective.  Orders: -     Qulipta ; Take 1 tablet (60 mg total) by mouth daily.  Dispense: 30 tablet; Refill: 5 -     Ubrelvy ; Taeke 1/2 to 1 at onset of aura/migraine  Dispense: 16 tablet; Refill: 1  Fibromyalgia Assessment & Plan: - Increase gabapentin  dosage as tolerated.   Spontaneous bruising Assessment & Plan: - Order blood work to check blood count, liver function, INR, and coagulation factors. - Advise reducing ibuprofen  use to minimize bruising.  Orders: -     CBC with Differential/Platelet -     Comprehensive metabolic panel with GFR -     Protime-INR  Overweight with body mass index (BMI) of 25 to 25.9 in adult  Mild recurrent major depression Assessment & Plan: Feeling discouraged and slow in completing tasks, likely related to chronic pain and migraine issues. No specific changes in mood or attention beyond Topamax  effects.   Attention or concentration deficit -     Methylphenidate  HCl; Take 2 tablets (20 mg total) by mouth 3 (three) times daily with meals.  Dispense: 180 tablet; Refill: 0  Encounter for immunization -     Flu vaccine, recombinant, trivalent, inj      Meds ordered this encounter  Medications   DISCONTD: Atogepant  (QULIPTA ) 60 MG TABS    Sig: Take 1 tablet (60 mg total) by mouth daily.    Dispense:  28 tablet    Refill:  0   DISCONTD: Ubrogepant  (UBRELVY ) 100 MG TABS    Sig: Taeke 1/2 to 1 at onset of aura/migraine   methylphenidate   (RITALIN ) 10 MG tablet    Sig: Take 2 tablets (20 mg total) by mouth 3 (three) times daily with meals.    Dispense:  180 tablet    Refill:  0   Atogepant  (QULIPTA ) 60 MG TABS    Sig: Take 1 tablet (60 mg total) by mouth daily.    Dispense:  30 tablet    Refill:  5   Ubrogepant  (UBRELVY ) 100 MG TABS    Sig: Taeke 1/2 to 1 at onset of aura/migraine    Dispense:  16 tablet    Refill:  1    Orders Placed This Encounter  Procedures   Flu vaccine, recombinant, trivalent, inj   CBC with Differential/Platelet   Comprehensive metabolic panel with GFR   Protime-INR       Follow-up: Return in about 4 weeks (around 11/09/2023) for chronic follow up.    LILLETTE Kato I Leal-Borjas,acting as a scribe for Abigail Free, MD.,have documented all relevant documentation on  the behalf of Abigail Free, MD,as directed by  Abigail Free, MD while in the presence of Abigail Free, MD.    An After Visit Summary was printed and given to the patient.  I attest that I have reviewed this visit and agree with the plan scribed by my staff.   Abigail Free, MD [facility] [phone]    "

## 2023-10-12 ENCOUNTER — Ambulatory Visit: Admitting: Family Medicine

## 2023-10-12 ENCOUNTER — Encounter: Payer: Self-pay | Admitting: Family Medicine

## 2023-10-12 VITALS — BP 110/60 | HR 89 | Temp 97.1°F | Ht 61.0 in | Wt 136.0 lb

## 2023-10-12 DIAGNOSIS — M797 Fibromyalgia: Secondary | ICD-10-CM

## 2023-10-12 DIAGNOSIS — R41840 Attention and concentration deficit: Secondary | ICD-10-CM

## 2023-10-12 DIAGNOSIS — M545 Low back pain, unspecified: Secondary | ICD-10-CM | POA: Diagnosis not present

## 2023-10-12 DIAGNOSIS — R233 Spontaneous ecchymoses: Secondary | ICD-10-CM

## 2023-10-12 DIAGNOSIS — Z6825 Body mass index (BMI) 25.0-25.9, adult: Secondary | ICD-10-CM

## 2023-10-12 DIAGNOSIS — F330 Major depressive disorder, recurrent, mild: Secondary | ICD-10-CM | POA: Diagnosis not present

## 2023-10-12 DIAGNOSIS — G8929 Other chronic pain: Secondary | ICD-10-CM | POA: Diagnosis not present

## 2023-10-12 DIAGNOSIS — E559 Vitamin D deficiency, unspecified: Secondary | ICD-10-CM

## 2023-10-12 DIAGNOSIS — E663 Overweight: Secondary | ICD-10-CM | POA: Diagnosis not present

## 2023-10-12 DIAGNOSIS — G43009 Migraine without aura, not intractable, without status migrainosus: Secondary | ICD-10-CM

## 2023-10-12 DIAGNOSIS — Z23 Encounter for immunization: Secondary | ICD-10-CM

## 2023-10-12 MED ORDER — QULIPTA 60 MG PO TABS: 1.0000 | ORAL_TABLET | Freq: Every day | ORAL | 0 refills | Status: DC

## 2023-10-12 MED ORDER — UBRELVY 100 MG PO TABS: ORAL_TABLET | ORAL | Status: DC

## 2023-10-12 MED ORDER — METHYLPHENIDATE HCL 10 MG PO TABS: 20.0000 mg | ORAL_TABLET | Freq: Three times a day (TID) | ORAL | 0 refills | Status: DC

## 2023-10-12 NOTE — Patient Instructions (Signed)
  VISIT SUMMARY: During your visit, we discussed your chronic back pain, migraines, fibromyalgia, joint pain, and easy bruising. We reviewed your current treatments and made adjustments to better manage your symptoms.  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 -Start physical therapy in [place] to improve balance and manage pain. -A CT scan was ordered by neurosurgery. Increase gabapentin  to three times a day gradually. Caution about balance.  MIGRAINE WITH AURA, REFRACTORY TO CURRENT THERAPY: Your migraines with aura are not well controlled by your current medications, and Topamax  is causing cognitive side effects. -Discontinue Topamax  ( you may taper off over a week. -Start taking Qulipta  once daily for migraine prevention. -Use Ubrelvy  samples for acute migraine treatment. One at onset of migraine.  -Continue using Imitrex  for acute migraine treatment if Ubrelvy  is ineffective.  FIBROMYALGIA WITH CHRONIC JOINT PAIN: You have chronic joint pain associated with fibromyalgia, which is worsened by activities like texting. -Increase gabapentin  dosage as tolerated.  EASY BRUISING: You experience easy bruising, which may be worsened by frequent ibuprofen  use. -Blood work will be done to check blood count, liver function, INR, and coagulation factors. -Reduce ibuprofen  use to minimize bruising.  GENERAL HEALTH MAINTENANCE: You are interested in trying an herbal remedy for pain management. -Perform baseline blood work before starting the herbal remedy. -Discuss potential herbal remedy for pain management, with follow-up liver and kidney function tests one month after starting. -Pain Away - you can get at [facility] in [place], [place]. Caution with using with ibuprofen .                       Contains text generated by [employer].                                  Contains text generated by [employer].

## 2023-10-13 DIAGNOSIS — E663 Overweight: Secondary | ICD-10-CM | POA: Insufficient documentation

## 2023-10-13 DIAGNOSIS — R233 Spontaneous ecchymoses: Secondary | ICD-10-CM | POA: Insufficient documentation

## 2023-10-13 LAB — COMPREHENSIVE METABOLIC PANEL WITH GFR
ALT: 40 IU/L — ABNORMAL HIGH (ref 0–32)
AST: 31 IU/L (ref 0–40)
Albumin: 4.2 g/dL (ref 3.8–4.9)
Alkaline Phosphatase: 99 IU/L (ref 49–135)
BUN/Creatinine Ratio: 15 (ref 9–23)
BUN: 13 mg/dL (ref 6–24)
Bilirubin Total: 0.3 mg/dL (ref 0.0–1.2)
CO2: 22 mmol/L (ref 20–29)
Calcium: 9.6 mg/dL (ref 8.7–10.2)
Chloride: 104 mmol/L (ref 96–106)
Creatinine, Ser: 0.86 mg/dL (ref 0.57–1.00)
Globulin, Total: 2.5 g/dL (ref 1.5–4.5)
Glucose: 83 mg/dL (ref 70–99)
Potassium: 4 mmol/L (ref 3.5–5.2)
Sodium: 141 mmol/L (ref 134–144)
Total Protein: 6.7 g/dL (ref 6.0–8.5)
eGFR: 81 mL/min/1.73 (ref >59)

## 2023-10-13 LAB — CBC WITH DIFFERENTIAL/PLATELET
Basophils Absolute: 0.1 x10E3/uL (ref 0.0–0.2)
Basos: 1 %
EOS (ABSOLUTE): 0.2 x10E3/uL (ref 0.0–0.4)
Eos: 2 %
Hematocrit: 40.3 % (ref 34.0–46.6)
Hemoglobin: 13.2 g/dL (ref 11.1–15.9)
Immature Grans (Abs): 0 x10E3/uL (ref 0.0–0.1)
Immature Granulocytes: 0 %
Lymphocytes Absolute: 2.1 x10E3/uL (ref 0.7–3.1)
Lymphs: 34 %
MCH: 32 pg (ref 26.6–33.0)
MCHC: 32.8 g/dL (ref 31.5–35.7)
MCV: 98 fL — ABNORMAL HIGH (ref 79–97)
Monocytes Absolute: 0.4 x10E3/uL (ref 0.1–0.9)
Monocytes: 6 %
Neutrophils Absolute: 3.5 x10E3/uL (ref 1.4–7.0)
Neutrophils: 57 %
Platelets: 392 x10E3/uL (ref 150–450)
RBC: 4.12 x10E6/uL (ref 3.77–5.28)
RDW: 12.6 % (ref 11.7–15.4)
WBC: 6.2 x10E3/uL (ref 3.4–10.8)

## 2023-10-13 LAB — PROTIME-INR
INR: 0.9 (ref 0.9–1.2)
Prothrombin Time: 9.9 s (ref 9.1–12.0)

## 2023-10-13 NOTE — Assessment & Plan Note (Signed)
-   Discontinue Topamax  by tapering off over a week. - Prescribe Qulipta  once daily for migraine prevention. - Provide Ubrelvy  samples for acute migraine treatment. - Continue Imitrex  for acute migraine treatment if Ubrelvy  is ineffective.

## 2023-10-13 NOTE — Assessment & Plan Note (Signed)
 Feeling discouraged and slow in completing tasks, likely related to chronic pain and migraine issues. No specific changes in mood or attention beyond Topamax  effects.

## 2023-10-13 NOTE — Assessment & Plan Note (Signed)
-   Order blood work to check blood count, liver function, INR, and coagulation factors. - Advise reducing ibuprofen  use to minimize bruising.

## 2023-10-13 NOTE — Assessment & Plan Note (Signed)
-   Increase gabapentin  dosage as tolerated.

## 2023-10-13 NOTE — Assessment & Plan Note (Signed)
-   Initiate physical therapy in [place] to improve balance and manage pain. - Order CT scan to assess bone density and anatomy.

## 2023-10-14 ENCOUNTER — Ambulatory Visit: Payer: Self-pay | Admitting: Family Medicine

## 2023-10-15 ENCOUNTER — Other Ambulatory Visit: Payer: Self-pay | Admitting: Family Medicine

## 2023-10-15 DIAGNOSIS — F330 Major depressive disorder, recurrent, mild: Secondary | ICD-10-CM

## 2023-10-17 DIAGNOSIS — N6313 Unspecified lump in the right breast, lower outer quadrant: Secondary | ICD-10-CM | POA: Diagnosis not present

## 2023-10-18 MED ORDER — QULIPTA 60 MG PO TABS
1.0000 | ORAL_TABLET | Freq: Every day | ORAL | 5 refills | Status: DC
Start: 2023-10-18 — End: 2023-11-12

## 2023-10-18 MED ORDER — UBRELVY 100 MG PO TABS: ORAL_TABLET | ORAL | 1 refills | Status: DC

## 2023-11-02 DIAGNOSIS — M545 Low back pain, unspecified: Secondary | ICD-10-CM | POA: Diagnosis not present

## 2023-11-06 ENCOUNTER — Other Ambulatory Visit: Payer: Self-pay | Admitting: Surgical

## 2023-11-06 DIAGNOSIS — M51369 Other intervertebral disc degeneration, lumbar region without mention of lumbar back pain or lower extremity pain: Secondary | ICD-10-CM

## 2023-11-06 DIAGNOSIS — M79604 Pain in right leg: Secondary | ICD-10-CM

## 2023-11-06 DIAGNOSIS — M48061 Spinal stenosis, lumbar region without neurogenic claudication: Secondary | ICD-10-CM

## 2023-11-08 NOTE — Assessment & Plan Note (Addendum)
 Fairly well controlled.  - Continue amitriptyline , duloxetine , ibuprofen , and cyclobenzaprine .

## 2023-11-08 NOTE — Assessment & Plan Note (Addendum)
 Qulipta  effective for prevention. Ubrelvy  and Imitrex  for acute attacks. Insurance approved Ubrelvy ; coupon may reduce cost. Fioricet advised as last resort due to rebound headache risk. - Prescribe Qulipta  for prevention. - Advise Ubrelvy  first for acute migraine, then Imitrex , Fioricet last. - Instruct to check for Ubrelvy  coupon online. - Encourage continuation of physical therapy for back pain.  Orders:   Ubrogepant  (UBRELVY ) 100 MG TABS; Taeke 1/2 to 1 at onset of aura/migraine

## 2023-11-08 NOTE — Progress Notes (Signed)
 Subjective:  Patient ID: Anne Brown, female    DOB: [birth year]  Age: 53 y.o. MRN: [id number]  Chief Complaint  Patient presents with   Medical Management of Chronic Issues   HPI: Discussed the use of AI scribe software for clinical note transcription with the patient, who gave verbal consent to proceed.  History of Present Illness Anne Brown is a 53 year old female with migraines and fibromyalgia who presents for follow-up on her migraine management and fibromyalgia symptoms.  Migraine headaches - Uses Qulipta  for migraine prevention with good effect; previously used Topamax  but discontinued - For acute migraine attacks, uses Ubrelvy , but cost is high due to insurance; has attempted to use a coupon to reduce expense - Previously received Botox injections for migraines with beneficial results - Currently uses Imitrex  and Fioricet for acute migraine relief  Chronic back pain and herniated disc - Undergoing physical therapy for back pain, attending sessions twice weekly since last week - History of herniated disc and chronic lower back pain; MRI performed in 2022 - Scheduled for a CT scan next Wednesday - No bone density test performed; uncertain if insurance will cover  Fibromyalgia and generalized pain - Takes gabapentin , recently increased to 300 mg three times daily, with possible improvement in symptoms - Experiences joint pain and achiness, especially in wrists and hands, worsening with use - History of tenosynovitis; intermittently uses a tennis elbow band - Stopped ibuprofen  due to excessive bruising; uses Pain Away intermittently - Not taking naproxen - Sore throat and body aches are present, considered baseline for her - No morning stiffness in hands, fever, chills, or earaches  Constitutional symptoms and medication adherence - Weight loss, intermittent weakness, and dizziness - Not taking blood pressure medication regularly       10/12/2023   10:40 AM 07/06/2023     9:41 AM 03/30/2023    9:42 AM 11/29/2022    3:39 PM 08/28/2022    2:47 PM  Depression screen PHQ 2/9  Decreased Interest 1 0 0 0 0  Down, Depressed, Hopeless 1 1 1  0 1  PHQ - 2 Score 2 1 1  0 1  Altered sleeping 1 0 0 0 0  Tired, decreased energy 1 2 2 1 2   Change in appetite 1 0 0 0 2  Feeling bad or failure about yourself  0 1 0 0 2  Trouble concentrating 2 2 0 2 3  Moving slowly or fidgety/restless 1 1 0 1 1  Suicidal thoughts 0 0 0 0 0  PHQ-9 Score 8 7 3 4 11   Difficult doing work/chores Not difficult at all Somewhat difficult Somewhat difficult Not difficult at all Very difficult        11/29/2022    3:39 PM  Fall Risk   Falls in the past year? 0  Number falls in past yr: 0  Injury with Fall? 0  Risk for fall due to : No Fall Risks  Follow up Falls evaluation completed    Patient Care Team: Sherre Clapper, MD as PCP - General (Family Medicine) Henry Slough, MD as Consulting Physician (Obstetrics and Gynecology)   Review of Systems  Constitutional:  Positive for fatigue. Negative for chills and fever.  HENT:  Positive for sore throat. Negative for congestion and ear pain.   Respiratory:  Negative for cough and shortness of breath.   Cardiovascular:  Negative for chest pain and palpitations.  Gastrointestinal:  Negative for abdominal pain, constipation, diarrhea, nausea and vomiting.  Endocrine: Negative  for polydipsia, polyphagia and polyuria.  Genitourinary:  Negative for difficulty urinating and dysuria.  Musculoskeletal:  Positive for arthralgias, back pain and myalgias.  Skin:  Negative for rash.  Neurological:  Positive for headaches.  Psychiatric/Behavioral:  Negative for dysphoric mood. The patient is not nervous/anxious.     Current Outpatient Medications on File Prior to Visit  Medication Sig Dispense Refill   amitriptyline  (ELAVIL ) 100 MG tablet TAKE 1 TABLET BY MOUTH EVERYDAY AT BEDTIME 90 tablet 2   buPROPion  (WELLBUTRIN  XL) 300 MG 24 hr tablet Take 1  tablet (300 mg total) by mouth daily. 90 tablet 1   butalbital -acetaminophen -caffeine  (FIORICET) 50-325-40 MG tablet TAKE 1 TO 2 TABLETS BY MOUTH EVERY 6 HOURS AS NEEDED FOR HEADACHE 30 tablet 2   cetirizine (ZYRTEC) 10 MG tablet Take 10 mg by mouth at bedtime.     clonazePAM  (KLONOPIN ) 0.5 MG tablet TAKE 1 TABLET (0.5 MG TOTAL) BY MOUTH TWICE A DAY AS NEEDED FOR ANXIETY 60 tablet 3   cyclobenzaprine  (FLEXERIL ) 5 MG tablet Take 1 tablet (5 mg total) by mouth 3 (three) times daily as needed. for muscle spams 270 tablet 1   DULoxetine  (CYMBALTA ) 60 MG capsule Take 1 capsule (60 mg total) by mouth 2 (two) times daily. 180 capsule 1   gabapentin  (NEURONTIN ) 300 MG capsule Take 1 capsule (300 mg total) by mouth 3 (three) times daily. 90 capsule 3   meclizine  (ANTIVERT ) 25 MG tablet Take 1 tablet (25 mg total) by mouth every 6 (six) hours as needed for dizziness. 30 tablet 0   naproxen (NAPROSYN) 500 MG tablet Take 500 mg by mouth daily as needed for moderate pain (pain score 4-6).     OnabotulinumtoxinA, Cosmetic, (BOTOX COSMETIC IM)      ondansetron  (ZOFRAN -ODT) 4 MG disintegrating tablet Take 4 mg by mouth every 8 (eight) hours as needed.     promethazine  (PHENERGAN ) 12.5 MG tablet TAKE 1 TABLET BY MOUTH EVERY 6 HOURS AS NEEDED FOR NAUSEA 60 tablet 2   SUMAtriptan  (IMITREX ) 50 MG tablet One at onset of migraine and may repeat in 2 hours x 1, if headache persists or recurs. 10 tablet 1   No current facility-administered medications on file prior to visit.   Past Medical History:  Diagnosis Date   Abnormal Pap smear    ADHD    Anxiety    Family history of adverse reaction to anesthesia    mother n/v severe   Headache    migraines   Lower back pain    LSIL (low grade squamous intraepithelial lesion) on Pap smear 10/15/2007   Past Surgical History:  Procedure Laterality Date   ANTERIOR AND POSTERIOR REPAIR N/A 06/27/2018   Procedure: ANTERIOR (CYSTOCELE) AND POSTERIOR REPAIR (RECTOCELE);   Surgeon: Henry Slough, MD;  Location: [facility];  Service: Gynecology;  Laterality: N/A;   DILATION AND CURETTAGE OF UTERUS  1992   DILATION AND CURETTAGE OF UTERUS  2009   ECTOPIC PREGNANCY SURGERY  01/2008   After BTL   HYSTEROSCOPY WITH NOVASURE N/A 06/27/2018   Procedure: HYSTEROSCOPY WITH NOVASURE;  Surgeon: Henry Slough, MD;  Location: [facility];  Service: Gynecology;  Laterality: N/A;   TUBAL LIGATION      Family History  Problem Relation Age of Onset   Depression Mother    Social History   Socioeconomic History   Marital status: Married    Spouse name: Not on file   Number of children: Not on file  Years of education: Not on file   Highest education level: GED or equivalent  Occupational History   Not on file  Tobacco Use   Smoking status: Never   Smokeless tobacco: Never  Vaping Use   Vaping status: Never Used  Substance and Sexual Activity   Alcohol use: Yes    Alcohol/week: 0.0 standard drinks of alcohol    Comment: 3 glasses a week    Drug use: No   Sexual activity: Yes    Birth control/protection: None, Surgical    Comment: 1st intercourse- 17, partners-2  Other Topics Concern   Not on file  Social History Narrative   Not on file   Social Drivers of Health   Financial Resource Strain: Low Risk  (08/28/2022)   Overall Financial Resource Strain (CARDIA)    Difficulty of Paying Living Expenses: Not hard at all  Food Insecurity: No Food Insecurity (07/06/2023)   Hunger Vital Sign    Worried About Running Out of Food in the Last Year: Never true    Ran Out of Food in the Last Year: Never true  Transportation Needs: No Transportation Needs (07/06/2023)   PRAPARE - Administrator, Civil Service (Medical): No    Lack of Transportation (Non-Medical): No  Physical Activity: Sufficiently Active (08/28/2022)   Exercise Vital Sign    Days of Exercise per Week: 3 days    Minutes of Exercise per Session: 50 min   Stress: No Stress Concern Present (08/28/2022)   [employer] of Occupational Health - Occupational Stress Questionnaire    Feeling of Stress : Not at all  Social Connections: Socially Integrated (08/28/2022)   Social Connection and Isolation Panel    Frequency of Communication with Friends and Family: Three times a week    Frequency of Social Gatherings with Friends and Family: Three times a week    Attends Religious Services: More than 4 times per year    Active Member of Clubs or Organizations: Yes    Attends Banker Meetings: More than 4 times per year    Marital Status: Married    Objective:  BP 102/70   Pulse 90   Temp (!) 97.4 F (36.3 C)   Resp 16   Ht 5' 1 (1.549 m)   Wt 140 lb (63.5 kg)   SpO2 95%   BMI 26.45 kg/m      11/09/2023   11:41 AM 11/09/2023   10:41 AM 10/12/2023   10:44 AM  BP/Weight  Systolic BP 102 90 110  Diastolic BP 70 64 60  Wt. (Lbs)  140 136  BMI  26.45 kg/m2 25.7 kg/m2    Physical Exam Vitals reviewed.  Constitutional:      Appearance: Normal appearance. She is normal weight.  HENT:     Mouth/Throat:     Pharynx: Oropharynx is clear.  Cardiovascular:     Rate and Rhythm: Normal rate and regular rhythm.     Heart sounds: Normal heart sounds. No murmur heard. Pulmonary:     Effort: Pulmonary effort is normal. No respiratory distress.     Breath sounds: Normal breath sounds.  Abdominal:     General: Abdomen is flat. Bowel sounds are normal.     Palpations: Abdomen is soft.     Tenderness: There is no abdominal tenderness.  Musculoskeletal:        General: Tenderness (lumbar) present.  Neurological:     Mental Status: She is alert and oriented to person, place, and  time.  Psychiatric:        Mood and Affect: Mood normal.        Behavior: Behavior normal.         Lab Results  Component Value Date   WBC 6.2 10/12/2023   HGB 13.2 10/12/2023   HCT 40.3 10/12/2023   PLT 392 10/12/2023   GLUCOSE 83 10/12/2023    CHOL 141 09/19/2021   TRIG 62 09/19/2021   HDL 79 09/19/2021   LDLCALC 49 09/19/2021   ALT 40 (H) 10/12/2023   AST 31 10/12/2023   NA 141 10/12/2023   K 4.0 10/12/2023   CL 104 10/12/2023   CREATININE 0.86 10/12/2023   BUN 13 10/12/2023   CO2 22 10/12/2023   TSH 3.020 07/06/2023   INR 0.9 10/12/2023    Results for orders placed or performed in visit on 11/09/23  HM MAMMOGRAPHY   Collection Time: 07/23/23 12:00 AM  Result Value Ref Range   HM Mammogram 0-4 Bi-Rad 0-4 Bi-Rad, Self Reported Normal  HM PAP SMEAR   Collection Time: 07/23/23 12:00 AM  Result Value Ref Range   HM Pap smear normal   .  Assessment & Plan:   Assessment & Plan Chronic midline low back pain with bilateral sciatica Lower back pain with right-sided sciatica. Physical therapy initiated. CT scan ordered by neurosurgeon. - Continue physical therapy twice a week. - Continue with gabapentin  300 mg three times a day.  - Proceed with scheduled CT scan.    Migraine without aura and without status migrainosus, not intractable Qulipta  effective for prevention. Ubrelvy  and Imitrex  for acute attacks. Insurance approved Ubrelvy ; coupon may reduce cost. Fioricet advised as last resort due to rebound headache risk. - Prescribe Qulipta  for prevention. - Advise Ubrelvy  first for acute migraine, then Imitrex , Fioricet last. - Instruct to check for Ubrelvy  coupon online. - Encourage continuation of physical therapy for back pain.  Orders:   Ubrogepant  (UBRELVY ) 100 MG TABS; Taeke 1/2 to 1 at onset of aura/migraine  Fibromyalgia Fairly well controlled.  - Continue amitriptyline , duloxetine , ibuprofen , and cyclobenzaprine .     Screen for colon cancer Message sent to patient to clarify if wanted a cologuard or colonoscopy.      Body mass index is 26.45 kg/m.     Meds ordered this encounter  Medications   Ubrogepant  (UBRELVY ) 100 MG TABS    Sig: Taeke 1/2 to 1 at onset of aura/migraine    Dispense:   16 tablet    Refill:  1    Orders Placed This Encounter  Procedures   HM MAMMOGRAPHY   HM PAP SMEAR       Follow-up: Return in about 3 months (around 02/09/2024) for chronic follow up.  An After Visit Summary was printed and given to the patient.  Abigail Free, MD [facility] [phone]

## 2023-11-08 NOTE — Assessment & Plan Note (Addendum)
 Lower back pain with right-sided sciatica. Physical therapy initiated. CT scan ordered by neurosurgeon. - Continue physical therapy twice a week. - Continue with gabapentin  300 mg three times a day.  - Proceed with scheduled CT scan.

## 2023-11-09 ENCOUNTER — Encounter: Payer: Self-pay | Admitting: Family Medicine

## 2023-11-09 ENCOUNTER — Ambulatory Visit: Admitting: Family Medicine

## 2023-11-09 VITALS — BP 102/70 | HR 90 | Temp 97.4°F | Resp 16 | Ht 61.0 in | Wt 140.0 lb

## 2023-11-09 DIAGNOSIS — M5441 Lumbago with sciatica, right side: Secondary | ICD-10-CM | POA: Diagnosis not present

## 2023-11-09 DIAGNOSIS — G43009 Migraine without aura, not intractable, without status migrainosus: Secondary | ICD-10-CM

## 2023-11-09 DIAGNOSIS — M5442 Lumbago with sciatica, left side: Secondary | ICD-10-CM | POA: Diagnosis not present

## 2023-11-09 DIAGNOSIS — M797 Fibromyalgia: Secondary | ICD-10-CM

## 2023-11-09 DIAGNOSIS — G8929 Other chronic pain: Secondary | ICD-10-CM

## 2023-11-09 DIAGNOSIS — Z1211 Encounter for screening for malignant neoplasm of colon: Secondary | ICD-10-CM

## 2023-11-09 MED ORDER — UBRELVY 100 MG PO TABS: ORAL_TABLET | ORAL | 1 refills | Status: DC

## 2023-11-12 ENCOUNTER — Other Ambulatory Visit: Payer: Self-pay | Admitting: Family Medicine

## 2023-11-12 ENCOUNTER — Encounter: Payer: Self-pay | Admitting: Family Medicine

## 2023-11-12 DIAGNOSIS — R41840 Attention and concentration deficit: Secondary | ICD-10-CM

## 2023-11-12 DIAGNOSIS — G43009 Migraine without aura, not intractable, without status migrainosus: Secondary | ICD-10-CM

## 2023-11-12 MED ORDER — METHYLPHENIDATE HCL 10 MG PO TABS: 20.0000 mg | ORAL_TABLET | Freq: Three times a day (TID) | ORAL | 0 refills | Status: DC

## 2023-11-12 MED ORDER — QULIPTA 60 MG PO TABS: 1.0000 | ORAL_TABLET | Freq: Every day | ORAL | 5 refills | Status: DC

## 2023-11-12 NOTE — Telephone Encounter (Signed)
Patient request refills

## 2023-11-14 ENCOUNTER — Ambulatory Visit
Admission: RE | Admit: 2023-11-14 | Discharge: 2023-11-14 | Disposition: A | Discharge status: Home / Self Care | Source: Ambulatory Visit | Attending: Surgical | Admitting: Surgical

## 2023-11-14 DIAGNOSIS — M51369 Other intervertebral disc degeneration, lumbar region without mention of lumbar back pain or lower extremity pain: Secondary | ICD-10-CM

## 2023-11-14 DIAGNOSIS — M79604 Pain in right leg: Secondary | ICD-10-CM

## 2023-11-14 DIAGNOSIS — M48061 Spinal stenosis, lumbar region without neurogenic claudication: Secondary | ICD-10-CM

## 2023-11-14 DIAGNOSIS — M4807 Spinal stenosis, lumbosacral region: Secondary | ICD-10-CM | POA: Diagnosis not present

## 2023-11-14 DIAGNOSIS — M5127 Other intervertebral disc displacement, lumbosacral region: Secondary | ICD-10-CM | POA: Diagnosis not present

## 2023-11-14 DIAGNOSIS — M47817 Spondylosis without myelopathy or radiculopathy, lumbosacral region: Secondary | ICD-10-CM | POA: Diagnosis not present

## 2023-11-17 ENCOUNTER — Encounter: Payer: Self-pay | Admitting: Family Medicine

## 2023-12-11 DIAGNOSIS — M48061 Spinal stenosis, lumbar region without neurogenic claudication: Secondary | ICD-10-CM | POA: Diagnosis not present

## 2023-12-11 DIAGNOSIS — M51362 Other intervertebral disc degeneration, lumbar region with discogenic back pain and lower extremity pain: Secondary | ICD-10-CM | POA: Diagnosis not present

## 2023-12-11 DIAGNOSIS — M79604 Pain in right leg: Secondary | ICD-10-CM | POA: Diagnosis not present

## 2023-12-11 DIAGNOSIS — M545 Low back pain, unspecified: Secondary | ICD-10-CM | POA: Diagnosis not present

## 2023-12-11 DIAGNOSIS — M79605 Pain in left leg: Secondary | ICD-10-CM | POA: Diagnosis not present

## 2023-12-13 DIAGNOSIS — M545 Low back pain, unspecified: Secondary | ICD-10-CM | POA: Diagnosis not present

## 2023-12-31 ENCOUNTER — Other Ambulatory Visit: Payer: Self-pay | Admitting: Family Medicine

## 2023-12-31 DIAGNOSIS — M797 Fibromyalgia: Secondary | ICD-10-CM

## 2023-12-31 DIAGNOSIS — F330 Major depressive disorder, recurrent, mild: Secondary | ICD-10-CM

## 2024-01-05 ENCOUNTER — Other Ambulatory Visit: Payer: Self-pay | Admitting: Family Medicine

## 2024-01-05 DIAGNOSIS — F330 Major depressive disorder, recurrent, mild: Secondary | ICD-10-CM

## 2024-01-14 ENCOUNTER — Telehealth: Payer: Self-pay | Admitting: Family Medicine

## 2024-01-14 DIAGNOSIS — R41840 Attention and concentration deficit: Secondary | ICD-10-CM

## 2024-01-14 NOTE — Telephone Encounter (Unsigned)
 Copied from CRM [id number]. Topic: Clinical - Medication Refill >> Jan 14, 2024  3:58 PM Teressa P wrote: Medication: Ritalin  10 mg  Has the patient contacted their pharmacy? Yes (Agent: If no, request that the patient contact the pharmacy for the refill. If patient does not wish to contact the pharmacy document the reason why and proceed with request.) (Agent: If yes, when and what did the pharmacy advise?)  This is the patient's preferred pharmacy:  [id number] - [place], [place] - [address] [facility] [place] [address] Phone: [phone] Fax: [phone]  Is this the correct pharmacy for this prescription? Yes If no, delete pharmacy and type the correct one.   Has the prescription been filled recently? Yes  Is the patient out of the medication? Yes  Has the patient been seen for an appointment in the last year OR does the patient have an upcoming appointment? Yes  Can we respond through MyChart? No  Agent: Please be advised that Rx refills may take up to 3 business days. We ask that you follow-up with your pharmacy.

## 2024-01-15 MED ORDER — METHYLPHENIDATE HCL 10 MG PO TABS: 20.0000 mg | ORAL_TABLET | Freq: Three times a day (TID) | ORAL | 0 refills | Status: DC

## 2024-02-12 ENCOUNTER — Encounter: Payer: Self-pay | Admitting: Family Medicine

## 2024-02-12 ENCOUNTER — Ambulatory Visit: Payer: Self-pay | Admitting: Family Medicine

## 2024-02-12 VITALS — BP 124/76 | HR 84 | Temp 98.1°F | Ht 61.0 in | Wt 141.0 lb

## 2024-02-12 DIAGNOSIS — G43009 Migraine without aura, not intractable, without status migrainosus: Secondary | ICD-10-CM | POA: Diagnosis not present

## 2024-02-12 DIAGNOSIS — M797 Fibromyalgia: Secondary | ICD-10-CM | POA: Diagnosis not present

## 2024-02-12 DIAGNOSIS — F988 Other specified behavioral and emotional disorders with onset usually occurring in childhood and adolescence: Secondary | ICD-10-CM | POA: Diagnosis not present

## 2024-02-12 DIAGNOSIS — F330 Major depressive disorder, recurrent, mild: Secondary | ICD-10-CM | POA: Diagnosis not present

## 2024-02-12 MED ORDER — AMPHETAMINE-DEXTROAMPHET ER 20 MG PO CP24: 20.0000 mg | ORAL_CAPSULE | ORAL | 0 refills | Status: AC

## 2024-02-12 NOTE — Assessment & Plan Note (Signed)
" °  Orders:   amphetamine -dextroamphetamine (ADDERALL XR) 20 MG 24 hr capsule; Take 1 capsule (20 mg total) by mouth every morning.  "

## 2024-02-12 NOTE — Progress Notes (Signed)
 "  Subjective:  Patient ID: Anne Brown, female    DOB: 07-02-1970  Age: 54 y.o. MRN: [id number]  Chief Complaint  Patient presents with   Medical Management of Chronic Issues    HPI: Discussed the use of AI scribe software for clinical note transcription with the patient, who gave verbal consent to proceed.  History of Present Illness Anne Brown is a 54 year old female who presents for a regular follow-up.  Attention deficit and anxiety symptoms - Ongoing poor concentration and attention issues - Anxiety attributed to lack of productivity and inability to complete tasks - Jumps from task to task without finishing them - Methylphenidate  has not improved attention; only medication used for attention - Clonazepam  taken as needed, especially on days when unable to calm down  Fibromyalgia - Previously well-controlled, but increased symptoms since last week - Amitriptyline  and cyclobenzaprine  taken as needed - Gabapentin  taken once daily due to concerns about weight gain - Cymbalta  used for fibromyalgia and mood  Migraine headaches - Improved with Qulipta  - One migraine episode last weekend - Ubrelvy  and Imitrex  used for acute management - Phenergan  and Zofran  available but rarely used - No recent use of naproxen, meclizine , or Fioricet  Mood disturbance and depression - Wellbutrin  XL used for depression - Cymbalta  used for mood symptoms - Difficulty with concentration and anxiety related to inability to complete tasks  Allergic symptoms - Alternates between Zyrtec and Allegra for allergies  Covid vaccine reaction - Significant side effects after last COVID vaccine, similar to prior COVID infection - Symptoms lasted only one day  Weight management - Actively working on [employer] management with healthy eating and exercise - Uses compounded semaglutide for appetite suppression; currently on low dose - Current weight is 141 pounds - Goal weight is 130-135 pounds  General  constitutional symptoms - No recent fevers, chills, sweats, earaches, or other symptoms of illness       02/12/2024   11:10 AM 10/12/2023   10:40 AM 07/06/2023    9:41 AM 03/30/2023    9:42 AM 11/29/2022    3:39 PM  Depression screen PHQ 2/9  Decreased Interest 0 1 0 0 0  Down, Depressed, Hopeless 1 1 1 1  0  PHQ - 2 Score 1 2 1 1  0  Altered sleeping 0 1 0 0 0  Tired, decreased energy 1 1 2 2 1   Change in appetite 0 1 0 0 0  Feeling bad or failure about yourself  1 0 1 0 0  Trouble concentrating 3 2 2  0 2  Moving slowly or fidgety/restless 0 1 1 0 1  Suicidal thoughts 0 0 0 0 0  PHQ-9 Score 6 8  7  3  4    Difficult doing work/chores Somewhat difficult Not difficult at all Somewhat difficult Somewhat difficult Not difficult at all     Data saved with a previous flowsheet row definition        11/29/2022    3:39 PM  Fall Risk   Falls in the past year? 0  Number falls in past yr: 0  Injury with Fall? 0   Risk for fall due to : No Fall Risks  Follow up Falls evaluation completed     Data saved with a previous flowsheet row definition    Patient Care Team: Sherre Clapper, MD as PCP - General (Family Medicine) Henry Slough, MD as Consulting Physician (Obstetrics and Gynecology)   Review of Systems  Constitutional:  Negative for chills, fatigue  and fever.  HENT:  Negative for congestion, ear pain and sore throat.   Respiratory:  Negative for cough and shortness of breath.   Cardiovascular:  Negative for chest pain.  Gastrointestinal:  Negative for abdominal pain, constipation, diarrhea, nausea and vomiting.  Genitourinary:  Negative for dysuria and urgency.  Musculoskeletal:  Negative for arthralgias and myalgias.  Skin:  Negative for rash.  Neurological:  Negative for dizziness and headaches.  Psychiatric/Behavioral:  Positive for decreased concentration. Negative for dysphoric mood. The patient is nervous/anxious.     Medications Ordered Prior to Encounter[1] Past  Medical History:  Diagnosis Date   Abnormal Pap smear    ADHD    Anxiety    Family history of adverse reaction to anesthesia    mother n/v severe   Headache    migraines   Lower back pain    LSIL (low grade squamous intraepithelial lesion) on Pap smear 10/15/2007   Past Surgical History:  Procedure Laterality Date   ANTERIOR AND POSTERIOR REPAIR N/A 06/27/2018   Procedure: ANTERIOR (CYSTOCELE) AND POSTERIOR REPAIR (RECTOCELE);  Surgeon: Henry Slough, MD;  Location: [facility];  Service: Gynecology;  Laterality: N/A;   DILATION AND CURETTAGE OF UTERUS  1992   DILATION AND CURETTAGE OF UTERUS  2009   ECTOPIC PREGNANCY SURGERY  01/2008   After BTL   HYSTEROSCOPY WITH NOVASURE N/A 06/27/2018   Procedure: HYSTEROSCOPY WITH NOVASURE;  Surgeon: Henry Slough, MD;  Location: [facility];  Service: Gynecology;  Laterality: N/A;   TUBAL LIGATION      Family History  Problem Relation Age of Onset   Depression Mother    Social History   Socioeconomic History   Marital status: Married    Spouse name: Not on file   Number of children: Not on file   Years of education: Not on file   Highest education level: GED or equivalent  Occupational History   Not on file  Tobacco Use   Smoking status: Never   Smokeless tobacco: Never  Vaping Use   Vaping status: Never Used  Substance and Sexual Activity   Alcohol use: Yes    Alcohol/week: 0.0 standard drinks of alcohol    Comment: 3 glasses a week    Drug use: No   Sexual activity: Yes    Birth control/protection: None, Surgical    Comment: 1st intercourse- 17, partners-2  Other Topics Concern   Not on file  Social History Narrative   Not on file   Social Drivers of Health   Tobacco Use: Low Risk  (12/11/2023)   [facility]   Patient History    Smoking Tobacco Use: Never    Smokeless Tobacco Use: Never    Passive Exposure: Not on file  Financial Resource Strain: Low  Risk (08/28/2022)   Overall Financial Resource Strain (CARDIA)    Difficulty of Paying Living Expenses: Not hard at all  Food Insecurity: No Food Insecurity (07/06/2023)   Epic    Worried About Programme Researcher, Broadcasting/film/video in the Last Year: Never true    Ran Out of Food in the Last Year: Never true  Transportation Needs: No Transportation Needs (07/06/2023)   Epic    Lack of Transportation (Medical): No    Lack of Transportation (Non-Medical): No  Physical Activity: Sufficiently Active (08/28/2022)   Exercise Vital Sign    Days of Exercise per Week: 3 days    Minutes of Exercise per Session: 50 min  Stress: No Stress Concern Present (08/28/2022)   [employer] of Occupational Health - Occupational Stress Questionnaire    Feeling of Stress : Not at all  Social Connections: Socially Integrated (08/28/2022)   Social Connection and Isolation Panel    Frequency of Communication with Friends and Family: Three times a week    Frequency of Social Gatherings with Friends and Family: Three times a week    Attends Religious Services: More than 4 times per year    Active Member of Clubs or Organizations: Yes    Attends Banker Meetings: More than 4 times per year    Marital Status: Married  Depression (PHQ2-9): Medium Risk (02/12/2024)   Depression (PHQ2-9)    PHQ-2 Score: 6  Alcohol Screen: Low Risk (08/28/2022)   Alcohol Screen    Last Alcohol Screening Score (AUDIT): 2  Housing: Unknown (10/03/2023)   Received from [facility]   Epic    Unable to Pay for Housing in the Last Year: Not on file    Number of Times Moved in the Last Year: Not on file    At any time in the past 12 months, were you homeless or living in a shelter (including now)?: No  Utilities: Not At Risk (07/06/2023)   Epic    Threatened with loss of utilities: No  Health Literacy: Adequate Health Literacy (11/29/2022)   B1300 Health Literacy    Frequency of need for help with medical instructions: Never     Objective:  BP 124/76   Pulse 84   Temp 98.1 F (36.7 C)   Ht 5' 1 (1.549 m)   Wt 141 lb (64 kg)   SpO2 94%   BMI 26.64 kg/m      02/12/2024   10:37 AM 11/09/2023   11:41 AM 11/09/2023   10:41 AM  BP/Weight  Systolic BP 124 102 90  Diastolic BP 76 70 64  Wt. (Lbs) 141  140  BMI 26.64 kg/m2  26.45 kg/m2    Physical Exam Vitals reviewed.  Constitutional:      Appearance: Normal appearance. She is normal weight.  Neck:     Vascular: No carotid bruit.  Cardiovascular:     Rate and Rhythm: Normal rate and regular rhythm.     Heart sounds: Normal heart sounds.  Pulmonary:     Effort: Pulmonary effort is normal. No respiratory distress.     Breath sounds: Normal breath sounds.  Abdominal:     General: Abdomen is flat. Bowel sounds are normal.     Palpations: Abdomen is soft.     Tenderness: There is no abdominal tenderness.  Neurological:     Mental Status: She is alert and oriented to person, place, and time.  Psychiatric:        Mood and Affect: Mood normal.        Behavior: Behavior normal.         Lab Results  Component Value Date   WBC 6.2 10/12/2023   HGB 13.2 10/12/2023   HCT 40.3 10/12/2023   PLT 392 10/12/2023   GLUCOSE 83 10/12/2023   CHOL 141 09/19/2021   TRIG 62 09/19/2021   HDL 79 09/19/2021   LDLCALC 49 09/19/2021   ALT 40 (H) 10/12/2023   AST 31 10/12/2023   NA 141 10/12/2023   K 4.0 10/12/2023   CL 104 10/12/2023   CREATININE 0.86 10/12/2023   BUN 13 10/12/2023   CO2 22 10/12/2023   TSH 3.020 07/06/2023  INR 0.9 10/12/2023    Results for orders placed or performed in visit on 11/09/23  HM MAMMOGRAPHY   Collection Time: 07/23/23 12:00 AM  Result Value Ref Range   HM Mammogram 0-4 Bi-Rad 0-4 Bi-Rad, Self Reported Normal  HM PAP SMEAR   Collection Time: 07/23/23 12:00 AM  Result Value Ref Range   HM Pap smear normal   .  Assessment & Plan:   Assessment & Plan Migraine without aura and without status migrainosus, not  intractable Qulipta  effective for prevention. Ubrelvy  and Imitrex  for acute attacks. Insurance approved Ubrelvy ; coupon may reduce cost. Fioricet advised as last resort due to rebound headache risk.   Fibromyalgia Fairly well controlled.  - Continue amitriptyline , duloxetine , ibuprofen , and cyclobenzaprine .     Attention deficit disorder (ADD) without hyperactivity  Orders:   amphetamine -dextroamphetamine (ADDERALL XR) 20 MG 24 hr capsule; Take 1 capsule (20 mg total) by mouth every morning.  Mild recurrent major depression Well controlled.  On Wellbutrin  for anxiety and depression, taken at night. - Continue Wellbutrin  as currently prescribed. On clonazepam .        Body mass index is 26.64 kg/m.    Meds ordered this encounter  Medications   amphetamine -dextroamphetamine (ADDERALL XR) 20 MG 24 hr capsule    Sig: Take 1 capsule (20 mg total) by mouth every morning.    Dispense:  30 capsule    Refill:  0    No orders of the defined types were placed in this encounter.      Follow-up: Return in about 4 weeks (around 03/11/2024).  An After Visit Summary was printed and given to the patient.  Abigail Free, MD [facility] [phone]    [1]  Current Outpatient Medications on File Prior to Visit  Medication Sig Dispense Refill   amitriptyline  (ELAVIL ) 100 MG tablet TAKE 1 TABLET BY MOUTH EVERYDAY AT BEDTIME 90 tablet 2   Atogepant  (QULIPTA ) 60 MG TABS Take 1 tablet (60 mg total) by mouth daily. 30 tablet 5   buPROPion  (WELLBUTRIN  XL) 300 MG 24 hr tablet TAKE 1 TABLET BY MOUTH EVERY DAY 30 tablet 5   clonazePAM  (KLONOPIN ) 0.5 MG tablet TAKE 1 TABLET (0.5 MG TOTAL) BY MOUTH TWICE A DAY AS NEEDED FOR ANXIETY 60 tablet 3   cyclobenzaprine  (FLEXERIL ) 5 MG tablet Take 1 tablet (5 mg total) by mouth 3 (three) times daily as needed. for muscle spams 270 tablet 1   DULoxetine  (CYMBALTA ) 60 MG capsule TAKE 1 CAPSULE BY MOUTH 2 TIMES DAILY. 60 capsule 5   gabapentin   (NEURONTIN ) 300 MG capsule TAKE 1 CAPSULE BY MOUTH THREE TIMES A DAY 90 capsule 3   OnabotulinumtoxinA, Cosmetic, (BOTOX COSMETIC IM)      ondansetron  (ZOFRAN -ODT) 4 MG disintegrating tablet Take 4 mg by mouth every 8 (eight) hours as needed.     promethazine  (PHENERGAN ) 12.5 MG tablet TAKE 1 TABLET BY MOUTH EVERY 6 HOURS AS NEEDED FOR NAUSEA 60 tablet 2   SUMAtriptan  (IMITREX ) 50 MG tablet One at onset of migraine and may repeat in 2 hours x 1, if headache persists or recurs. 10 tablet 1   Ubrogepant  (UBRELVY ) 100 MG TABS [place]aeke 1/2 to 1 at onset of aura/migraine 16 tablet 1   No current facility-administered medications on file prior to visit.   "

## 2024-02-12 NOTE — Assessment & Plan Note (Signed)
 Qulipta  effective for prevention. Ubrelvy  and Imitrex  for acute attacks. Insurance approved Ubrelvy ; coupon may reduce cost. Fioricet advised as last resort due to rebound headache risk.

## 2024-02-12 NOTE — Patient Instructions (Signed)
" °  VISIT SUMMARY: Anne Brown, a 54 year old female, came in for a regular follow-up. We discussed her ongoing issues with attention deficit, anxiety, fibromyalgia, migraines, mood disturbances, allergies, and weight management.  YOUR PLAN: ATTENTION-DEFICIT HYPERACTIVITY DISORDER, PREDOMINANTLY INATTENTIVE TYPE: You have ongoing issues with concentration and productivity, and your current medication is not effective. -Start taking Adderall 20 mg extended release in the morning. -If needed, take 10 mg Ritalin  in the afternoon, but avoid taking it after 4 PM. -Stop taking Ritalin  after one week if Adderall is effective.  FIBROMYALGIA: Your fibromyalgia symptoms have worsened recently. -Continue taking amitriptyline  and cyclobenzaprine  as needed. -Continue taking gabapentin  once daily.  MIGRAINE: Your migraines have improved with Qulipta , but you had a recent episode. -Continue taking Qulipta  for prevention. -Use Ubrelvy  and Imitrex  as needed for acute migraine attacks.  MAJOR DEPRESSIVE DISORDER AND ANXIETY DISORDER: You have ongoing issues with mood and anxiety, which may affect your concentration. -Continue taking Cymbalta  and Wellbutrin  XL. -Use clonazepam  as needed for anxiety.  ALLERGIC RHINITIS: You have ongoing allergic symptoms. -Continue alternating between Zyrtec and Allegra as needed.  OBESITY: You are actively working on [employer] with diet, exercise, and medication. -Continue taking compounded semaglutide. -Maintain a balanced diet with protein, fruits, and vegetables. -Engage in regular exercise.    Contains text generated by [employer].   "

## 2024-02-12 NOTE — Assessment & Plan Note (Signed)
 Fairly well controlled.  - Continue amitriptyline , duloxetine , ibuprofen , and cyclobenzaprine .

## 2024-02-12 NOTE — Assessment & Plan Note (Signed)
 Well controlled.  On Wellbutrin  for anxiety and depression, taken at night. - Continue Wellbutrin  as currently prescribed. On clonazepam .

## 2024-02-21 ENCOUNTER — Encounter: Payer: Self-pay | Admitting: Family Medicine

## 2024-02-21 ENCOUNTER — Other Ambulatory Visit: Payer: Self-pay

## 2024-02-25 ENCOUNTER — Telehealth: Payer: Self-pay

## 2024-02-25 ENCOUNTER — Other Ambulatory Visit (HOSPITAL_COMMUNITY): Payer: Self-pay

## 2024-02-25 NOTE — Telephone Encounter (Signed)
 Pharmacy Patient Advocate Encounter   Received notification from [facility] KEY that prior authorization for Qulipta  is required/requested.   Insurance verification completed.   The patient is insured through [facility] COMMER[employer].   Per test claim: PA required; PA submitted to above mentioned insurance via Latent Key/confirmation #/EOC [id number] Status is pending

## 2024-02-25 NOTE — Telephone Encounter (Signed)
 Opened in Error.

## 2024-02-26 ENCOUNTER — Other Ambulatory Visit: Payer: Self-pay | Admitting: Family Medicine

## 2024-02-26 DIAGNOSIS — G43009 Migraine without aura, not intractable, without status migrainosus: Secondary | ICD-10-CM

## 2024-02-26 NOTE — Telephone Encounter (Signed)
 Pharmacy Patient Advocate Encounter  Received notification from [facility] MEDICAID that Prior Authorization for Ubrelvy  has been DENIED.  Full denial letter will be uploaded to the media tab. See denial reason below.   PA #/Case ID/Reference #: [id number]

## 2024-04-07 ENCOUNTER — Ambulatory Visit: Admitting: Family Medicine
